# Patient Record
Sex: Male | Born: 1955 | Race: Black or African American | Hispanic: No | Marital: Single
Health system: Southern US, Community
[De-identification: ages and names within clinical notes are randomized; demographics above are authoritative.]

---

## 1998-10-27 ENCOUNTER — Encounter: Payer: Self-pay | Admitting: Emergency Medicine

## 1998-10-27 ENCOUNTER — Inpatient Hospital Stay (HOSPITAL_COMMUNITY): Admission: EM | Admit: 1998-10-27 | Discharge: 1998-10-28 | Payer: Self-pay | Admitting: Emergency Medicine

## 1998-10-27 ENCOUNTER — Encounter: Payer: Self-pay | Admitting: Cardiology

## 1998-11-17 ENCOUNTER — Emergency Department (HOSPITAL_COMMUNITY): Admission: EM | Admit: 1998-11-17 | Discharge: 1998-11-17 | Payer: Self-pay | Admitting: Emergency Medicine

## 1998-11-17 ENCOUNTER — Encounter: Payer: Self-pay | Admitting: Emergency Medicine

## 1998-12-12 ENCOUNTER — Emergency Department (HOSPITAL_COMMUNITY): Admission: EM | Admit: 1998-12-12 | Discharge: 1998-12-12 | Payer: Self-pay | Admitting: Emergency Medicine

## 1999-05-19 ENCOUNTER — Emergency Department (HOSPITAL_COMMUNITY): Admission: EM | Admit: 1999-05-19 | Discharge: 1999-05-19 | Payer: Self-pay | Admitting: Emergency Medicine

## 2010-10-13 ENCOUNTER — Emergency Department: Payer: Self-pay | Admitting: Emergency Medicine

## 2011-03-02 ENCOUNTER — Ambulatory Visit: Payer: Self-pay | Admitting: Surgery

## 2013-03-23 ENCOUNTER — Ambulatory Visit: Payer: Self-pay | Admitting: Family Medicine

## 2013-06-15 ENCOUNTER — Ambulatory Visit: Payer: Self-pay | Admitting: Family Medicine

## 2020-07-20 ENCOUNTER — Other Ambulatory Visit: Payer: Self-pay

## 2020-07-20 ENCOUNTER — Emergency Department (HOSPITAL_COMMUNITY): Payer: Medicaid Other

## 2020-07-20 ENCOUNTER — Inpatient Hospital Stay (HOSPITAL_COMMUNITY)
Admission: RE | Admit: 2020-07-20 | Discharge: 2020-08-20 | DRG: 207 | Disposition: E | Payer: Medicaid Other | Attending: Pulmonary Disease | Admitting: Pulmonary Disease

## 2020-07-20 DIAGNOSIS — R0902 Hypoxemia: Secondary | ICD-10-CM

## 2020-07-20 DIAGNOSIS — J810 Acute pulmonary edema: Secondary | ICD-10-CM | POA: Diagnosis not present

## 2020-07-20 DIAGNOSIS — D6959 Other secondary thrombocytopenia: Secondary | ICD-10-CM | POA: Diagnosis present

## 2020-07-20 DIAGNOSIS — R3129 Other microscopic hematuria: Secondary | ICD-10-CM | POA: Diagnosis present

## 2020-07-20 DIAGNOSIS — N400 Enlarged prostate without lower urinary tract symptoms: Secondary | ICD-10-CM | POA: Diagnosis present

## 2020-07-20 DIAGNOSIS — Z452 Encounter for adjustment and management of vascular access device: Secondary | ICD-10-CM

## 2020-07-20 DIAGNOSIS — T829XXA Unspecified complication of cardiac and vascular prosthetic device, implant and graft, initial encounter: Secondary | ICD-10-CM

## 2020-07-20 DIAGNOSIS — J80 Acute respiratory distress syndrome: Secondary | ICD-10-CM | POA: Diagnosis present

## 2020-07-20 DIAGNOSIS — F17210 Nicotine dependence, cigarettes, uncomplicated: Secondary | ICD-10-CM | POA: Diagnosis present

## 2020-07-20 DIAGNOSIS — R6521 Severe sepsis with septic shock: Secondary | ICD-10-CM | POA: Diagnosis not present

## 2020-07-20 DIAGNOSIS — R34 Anuria and oliguria: Secondary | ICD-10-CM | POA: Diagnosis present

## 2020-07-20 DIAGNOSIS — Z66 Do not resuscitate: Secondary | ICD-10-CM | POA: Diagnosis not present

## 2020-07-20 DIAGNOSIS — J1282 Pneumonia due to coronavirus disease 2019: Secondary | ICD-10-CM | POA: Diagnosis present

## 2020-07-20 DIAGNOSIS — M7989 Other specified soft tissue disorders: Secondary | ICD-10-CM | POA: Diagnosis not present

## 2020-07-20 DIAGNOSIS — R531 Weakness: Secondary | ICD-10-CM | POA: Diagnosis not present

## 2020-07-20 DIAGNOSIS — J069 Acute upper respiratory infection, unspecified: Secondary | ICD-10-CM | POA: Diagnosis not present

## 2020-07-20 DIAGNOSIS — R7989 Other specified abnormal findings of blood chemistry: Secondary | ICD-10-CM | POA: Diagnosis not present

## 2020-07-20 DIAGNOSIS — Z515 Encounter for palliative care: Secondary | ICD-10-CM | POA: Diagnosis not present

## 2020-07-20 DIAGNOSIS — E872 Acidosis: Secondary | ICD-10-CM | POA: Diagnosis present

## 2020-07-20 DIAGNOSIS — R140 Abdominal distension (gaseous): Secondary | ICD-10-CM

## 2020-07-20 DIAGNOSIS — E1122 Type 2 diabetes mellitus with diabetic chronic kidney disease: Secondary | ICD-10-CM | POA: Diagnosis present

## 2020-07-20 DIAGNOSIS — N1832 Chronic kidney disease, stage 3b: Secondary | ICD-10-CM | POA: Diagnosis present

## 2020-07-20 DIAGNOSIS — N170 Acute kidney failure with tubular necrosis: Secondary | ICD-10-CM | POA: Diagnosis present

## 2020-07-20 DIAGNOSIS — I361 Nonrheumatic tricuspid (valve) insufficiency: Secondary | ICD-10-CM | POA: Diagnosis not present

## 2020-07-20 DIAGNOSIS — I82451 Acute embolism and thrombosis of right peroneal vein: Secondary | ICD-10-CM | POA: Diagnosis not present

## 2020-07-20 DIAGNOSIS — G9341 Metabolic encephalopathy: Secondary | ICD-10-CM | POA: Diagnosis not present

## 2020-07-20 DIAGNOSIS — Z7189 Other specified counseling: Secondary | ICD-10-CM

## 2020-07-20 DIAGNOSIS — I2609 Other pulmonary embolism with acute cor pulmonale: Secondary | ICD-10-CM | POA: Diagnosis not present

## 2020-07-20 DIAGNOSIS — U071 COVID-19: Principal | ICD-10-CM

## 2020-07-20 DIAGNOSIS — I129 Hypertensive chronic kidney disease with stage 1 through stage 4 chronic kidney disease, or unspecified chronic kidney disease: Secondary | ICD-10-CM | POA: Diagnosis present

## 2020-07-20 DIAGNOSIS — T380X5A Adverse effect of glucocorticoids and synthetic analogues, initial encounter: Secondary | ICD-10-CM | POA: Diagnosis not present

## 2020-07-20 DIAGNOSIS — N179 Acute kidney failure, unspecified: Secondary | ICD-10-CM

## 2020-07-20 DIAGNOSIS — E861 Hypovolemia: Secondary | ICD-10-CM | POA: Diagnosis not present

## 2020-07-20 DIAGNOSIS — E1165 Type 2 diabetes mellitus with hyperglycemia: Secondary | ICD-10-CM | POA: Diagnosis not present

## 2020-07-20 DIAGNOSIS — A4189 Other specified sepsis: Secondary | ICD-10-CM | POA: Diagnosis not present

## 2020-07-20 DIAGNOSIS — J9602 Acute respiratory failure with hypercapnia: Secondary | ICD-10-CM

## 2020-07-20 DIAGNOSIS — Z9911 Dependence on respirator [ventilator] status: Secondary | ICD-10-CM

## 2020-07-20 DIAGNOSIS — D6489 Other specified anemias: Secondary | ICD-10-CM | POA: Diagnosis not present

## 2020-07-20 DIAGNOSIS — E875 Hyperkalemia: Secondary | ICD-10-CM | POA: Diagnosis present

## 2020-07-20 DIAGNOSIS — E11649 Type 2 diabetes mellitus with hypoglycemia without coma: Secondary | ICD-10-CM | POA: Diagnosis not present

## 2020-07-20 DIAGNOSIS — R001 Bradycardia, unspecified: Secondary | ICD-10-CM | POA: Diagnosis not present

## 2020-07-20 DIAGNOSIS — Z4659 Encounter for fitting and adjustment of other gastrointestinal appliance and device: Secondary | ICD-10-CM

## 2020-07-20 DIAGNOSIS — J9600 Acute respiratory failure, unspecified whether with hypoxia or hypercapnia: Secondary | ICD-10-CM

## 2020-07-20 DIAGNOSIS — R579 Shock, unspecified: Secondary | ICD-10-CM

## 2020-07-20 DIAGNOSIS — I480 Paroxysmal atrial fibrillation: Secondary | ICD-10-CM | POA: Diagnosis not present

## 2020-07-20 DIAGNOSIS — Z01818 Encounter for other preprocedural examination: Secondary | ICD-10-CM

## 2020-07-20 DIAGNOSIS — I2699 Other pulmonary embolism without acute cor pulmonale: Secondary | ICD-10-CM | POA: Diagnosis not present

## 2020-07-20 DIAGNOSIS — J9601 Acute respiratory failure with hypoxia: Secondary | ICD-10-CM | POA: Diagnosis present

## 2020-07-20 DIAGNOSIS — J9690 Respiratory failure, unspecified, unspecified whether with hypoxia or hypercapnia: Secondary | ICD-10-CM

## 2020-07-20 DIAGNOSIS — Z992 Dependence on renal dialysis: Secondary | ICD-10-CM

## 2020-07-20 DIAGNOSIS — Z781 Physical restraint status: Secondary | ICD-10-CM

## 2020-07-20 LAB — CBC WITH DIFFERENTIAL/PLATELET
Abs Immature Granulocytes: 0.04 10*3/uL (ref 0.00–0.07)
Basophils Absolute: 0 10*3/uL (ref 0.0–0.1)
Basophils Relative: 0 %
Eosinophils Absolute: 0 10*3/uL (ref 0.0–0.5)
Eosinophils Relative: 0 %
HCT: 51.2 % (ref 39.0–52.0)
Hemoglobin: 17.1 g/dL — ABNORMAL HIGH (ref 13.0–17.0)
Immature Granulocytes: 1 %
Lymphocytes Relative: 15 %
Lymphs Abs: 0.8 10*3/uL (ref 0.7–4.0)
MCH: 28 pg (ref 26.0–34.0)
MCHC: 33.4 g/dL (ref 30.0–36.0)
MCV: 83.8 fL (ref 80.0–100.0)
Monocytes Absolute: 0.6 10*3/uL (ref 0.1–1.0)
Monocytes Relative: 11 %
Neutro Abs: 4.2 10*3/uL (ref 1.7–7.7)
Neutrophils Relative %: 73 %
Platelets: 226 10*3/uL (ref 150–400)
RBC: 6.11 MIL/uL — ABNORMAL HIGH (ref 4.22–5.81)
RDW: 12.9 % (ref 11.5–15.5)
WBC: 5.6 10*3/uL (ref 4.0–10.5)
nRBC: 0 % (ref 0.0–0.2)

## 2020-07-20 LAB — COMPREHENSIVE METABOLIC PANEL
ALT: 46 U/L — ABNORMAL HIGH (ref 0–44)
ALT: 48 U/L — ABNORMAL HIGH (ref 0–44)
AST: 113 U/L — ABNORMAL HIGH (ref 15–41)
AST: 108 U/L — ABNORMAL HIGH (ref 15–41)
Albumin: 2.5 g/dL — ABNORMAL LOW (ref 3.5–5.0)
Albumin: 2.5 g/dL — ABNORMAL LOW (ref 3.5–5.0)
Alkaline Phosphatase: 59 U/L (ref 38–126)
Alkaline Phosphatase: 59 U/L (ref 38–126)
Anion gap: 20 — ABNORMAL HIGH (ref 5–15)
Anion gap: 22 — ABNORMAL HIGH (ref 5–15)
BUN: 101 mg/dL — ABNORMAL HIGH (ref 8–23)
BUN: 99 mg/dL — ABNORMAL HIGH (ref 8–23)
CO2: 16 mmol/L — ABNORMAL LOW (ref 22–32)
CO2: 17 mmol/L — ABNORMAL LOW (ref 22–32)
Calcium: 8.3 mg/dL — ABNORMAL LOW (ref 8.9–10.3)
Calcium: 8.5 mg/dL — ABNORMAL LOW (ref 8.9–10.3)
Chloride: 97 mmol/L — ABNORMAL LOW (ref 98–111)
Chloride: 96 mmol/L — ABNORMAL LOW (ref 98–111)
Creatinine, Ser: 7.9 mg/dL — ABNORMAL HIGH (ref 0.61–1.24)
Creatinine, Ser: 8.12 mg/dL — ABNORMAL HIGH (ref 0.61–1.24)
GFR calc Af Amer: 8 mL/min — ABNORMAL LOW (ref >60)
GFR calc Af Amer: 7 mL/min — ABNORMAL LOW (ref >60)
GFR calc non Af Amer: 7 mL/min — ABNORMAL LOW (ref >60)
GFR calc non Af Amer: 6 mL/min — ABNORMAL LOW (ref >60)
Glucose, Bld: 184 mg/dL — ABNORMAL HIGH (ref 70–99)
Glucose, Bld: 182 mg/dL — ABNORMAL HIGH (ref 70–99)
Potassium: 5 mmol/L (ref 3.5–5.1)
Potassium: 5.2 mmol/L — ABNORMAL HIGH (ref 3.5–5.1)
Sodium: 133 mmol/L — ABNORMAL LOW (ref 135–145)
Sodium: 135 mmol/L (ref 135–145)
Total Bilirubin: 0.4 mg/dL (ref 0.3–1.2)
Total Bilirubin: 0.3 mg/dL (ref 0.3–1.2)
Total Protein: 7.7 g/dL (ref 6.5–8.1)
Total Protein: 7.8 g/dL (ref 6.5–8.1)

## 2020-07-20 LAB — BLOOD GAS, ARTERIAL
Acid-base deficit: 11.3 mmol/L — ABNORMAL HIGH (ref 0.0–2.0)
Bicarbonate: 13.2 mmol/L — ABNORMAL LOW (ref 20.0–28.0)
FIO2: 100
O2 Saturation: 93.6 %
Patient temperature: 98.6
pCO2 arterial: 27 mmHg — ABNORMAL LOW (ref 32.0–48.0)
pH, Arterial: 7.31 — ABNORMAL LOW (ref 7.350–7.450)
pO2, Arterial: 78 mmHg — ABNORMAL LOW (ref 83.0–108.0)

## 2020-07-20 LAB — SARS CORONAVIRUS 2 BY RT PCR (HOSPITAL ORDER, PERFORMED IN ~~LOC~~ HOSPITAL LAB): SARS Coronavirus 2: POSITIVE — AB

## 2020-07-20 LAB — TROPONIN I (HIGH SENSITIVITY)
Troponin I (High Sensitivity): 23 ng/L — ABNORMAL HIGH (ref <18)
Troponin I (High Sensitivity): 21 ng/L — ABNORMAL HIGH (ref <18)

## 2020-07-20 LAB — MRSA PCR SCREENING: MRSA by PCR: NEGATIVE

## 2020-07-20 LAB — PROCALCITONIN: Procalcitonin: 4.75 ng/mL

## 2020-07-20 LAB — CBG MONITORING, ED
Glucose-Capillary: 197 mg/dL — ABNORMAL HIGH (ref 70–99)
Glucose-Capillary: 263 mg/dL — ABNORMAL HIGH (ref 70–99)

## 2020-07-20 LAB — FERRITIN: Ferritin: 1711 ng/mL — ABNORMAL HIGH (ref 24–336)

## 2020-07-20 LAB — FIBRINOGEN: Fibrinogen: >800 mg/dL — ABNORMAL HIGH (ref 210–475)

## 2020-07-20 LAB — GLUCOSE, CAPILLARY: Glucose-Capillary: 271 mg/dL — ABNORMAL HIGH (ref 70–99)

## 2020-07-20 LAB — D-DIMER, QUANTITATIVE: D-Dimer, Quant: 5.76 ug/mL-FEU — ABNORMAL HIGH (ref 0.00–0.50)

## 2020-07-20 LAB — LACTATE DEHYDROGENASE: LDH: 1003 U/L — ABNORMAL HIGH (ref 98–192)

## 2020-07-20 LAB — C-REACTIVE PROTEIN: CRP: 23.9 mg/dL — ABNORMAL HIGH (ref <1.0)

## 2020-07-20 LAB — LACTIC ACID, PLASMA
Lactic Acid, Venous: 2.1 mmol/L (ref 0.5–1.9)
Lactic Acid, Venous: 1.3 mmol/L (ref 0.5–1.9)

## 2020-07-20 LAB — HIV ANTIBODY (ROUTINE TESTING W REFLEX): HIV Screen 4th Generation wRfx: NONREACTIVE

## 2020-07-20 LAB — TRIGLYCERIDES: Triglycerides: 306 mg/dL — ABNORMAL HIGH (ref <150)

## 2020-07-20 LAB — ABO/RH: ABO/RH(D): O POS

## 2020-07-20 MED ORDER — TOCILIZUMAB 400 MG/20ML IV SOLN
800.0000 mg | Freq: Once | INTRAVENOUS | Status: AC
  Administered 2020-07-20: 800 mg via INTRAVENOUS
  Filled 2020-07-20: qty 40

## 2020-07-20 MED ORDER — ACETAMINOPHEN 325 MG PO TABS: 650.0000 mg | ORAL_TABLET | Freq: Four times a day (QID) | ORAL | Status: DC | PRN

## 2020-07-20 MED ORDER — ONDANSETRON HCL 4 MG PO TABS: 4.0000 mg | ORAL_TABLET | Freq: Four times a day (QID) | ORAL | Status: DC | PRN

## 2020-07-20 MED ORDER — ZINC SULFATE 220 (50 ZN) MG PO CAPS
220.0000 mg | ORAL_CAPSULE | Freq: Every day | ORAL | Status: DC
  Administered 2020-07-20 – 2020-07-25 (×6): 220 mg via ORAL
  Filled 2020-07-20 (×6): qty 1

## 2020-07-20 MED ORDER — DEXAMETHASONE SODIUM PHOSPHATE 10 MG/ML IJ SOLN
6.0000 mg | INTRAMUSCULAR | Status: DC
  Administered 2020-07-21: 6 mg via INTRAVENOUS
  Filled 2020-07-20: qty 1

## 2020-07-20 MED ORDER — ASCORBIC ACID 500 MG PO TABS
500.0000 mg | ORAL_TABLET | Freq: Every day | ORAL | Status: DC
  Administered 2020-07-20 – 2020-07-25 (×6): 500 mg via ORAL
  Filled 2020-07-20 (×6): qty 1

## 2020-07-20 MED ORDER — HEPARIN SODIUM (PORCINE) 5000 UNIT/ML IJ SOLN
5000.0000 [IU] | Freq: Three times a day (TID) | INTRAMUSCULAR | Status: DC
  Administered 2020-07-20 – 2020-07-22 (×5): 5000 [IU] via SUBCUTANEOUS
  Filled 2020-07-20 (×5): qty 1

## 2020-07-20 MED ORDER — ONDANSETRON HCL 4 MG/2ML IJ SOLN: 4.0000 mg | Freq: Four times a day (QID) | INTRAMUSCULAR | Status: DC | PRN

## 2020-07-20 MED ORDER — SODIUM CHLORIDE 0.9 % IV BOLUS
1000.0000 mL | Freq: Once | INTRAVENOUS | Status: AC
  Administered 2020-07-20: 1000 mL via INTRAVENOUS

## 2020-07-20 MED ORDER — SODIUM CHLORIDE 0.9 % IV SOLN
2.0000 g | Freq: Every day | INTRAVENOUS | Status: DC
  Administered 2020-07-20 – 2020-07-23 (×4): 2 g via INTRAVENOUS
  Filled 2020-07-20 (×2): qty 2
  Filled 2020-07-20 (×2): qty 20

## 2020-07-20 MED ORDER — SODIUM CHLORIDE 0.9 % IV SOLN
100.0000 mg | INTRAVENOUS | Status: AC
  Administered 2020-07-20: 100 mg via INTRAVENOUS
  Filled 2020-07-20: qty 20

## 2020-07-20 MED ORDER — SODIUM CHLORIDE 0.9% FLUSH
3.0000 mL | Freq: Two times a day (BID) | INTRAVENOUS | Status: DC
  Administered 2020-07-20 – 2020-08-10 (×37): 3 mL via INTRAVENOUS

## 2020-07-20 MED ORDER — INSULIN ASPART 100 UNIT/ML ~~LOC~~ SOLN
0.0000 [IU] | Freq: Three times a day (TID) | SUBCUTANEOUS | Status: DC
  Administered 2020-07-20: 3 [IU] via SUBCUTANEOUS
  Administered 2020-07-21: 2 [IU] via SUBCUTANEOUS
  Administered 2020-07-21: 3 [IU] via SUBCUTANEOUS
  Administered 2020-07-21: 2 [IU] via SUBCUTANEOUS
  Filled 2020-07-20: qty 0.09

## 2020-07-20 MED ORDER — DEXAMETHASONE SODIUM PHOSPHATE 10 MG/ML IJ SOLN
6.0000 mg | Freq: Once | INTRAMUSCULAR | Status: AC
  Administered 2020-07-20: 6 mg via INTRAVENOUS
  Filled 2020-07-20: qty 1

## 2020-07-20 MED ORDER — ORAL CARE MOUTH RINSE
15.0000 mL | Freq: Two times a day (BID) | OROMUCOSAL | Status: DC
  Administered 2020-07-20: 15 mL via OROMUCOSAL

## 2020-07-20 MED ORDER — SODIUM CHLORIDE 0.9 % IV SOLN
100.0000 mg | Freq: Every day | INTRAVENOUS | Status: AC
  Administered 2020-07-21 – 2020-07-24 (×4): 100 mg via INTRAVENOUS
  Filled 2020-07-20 (×4): qty 20

## 2020-07-20 MED ORDER — CHLORHEXIDINE GLUCONATE CLOTH 2 % EX PADS
6.0000 | MEDICATED_PAD | Freq: Every day | CUTANEOUS | Status: DC
  Administered 2020-07-20 – 2020-08-09 (×23): 6 via TOPICAL

## 2020-07-20 MED ORDER — SALINE SPRAY 0.65 % NA SOLN
1.0000 | NASAL | Status: DC | PRN
  Administered 2020-07-20: 1 via NASAL
  Filled 2020-07-20: qty 44

## 2020-07-20 MED ORDER — SODIUM CHLORIDE 0.9 % IV SOLN: 200.0000 mg | Freq: Once | INTRAVENOUS | Status: DC

## 2020-07-20 MED ORDER — REMDESIVIR 100 MG IV SOLR
100.0000 mg | Freq: Every day | INTRAVENOUS | Status: DC
Start: 2020-07-21 — End: 2020-07-20

## 2020-07-20 MED ORDER — LACTATED RINGERS IV SOLN: INTRAVENOUS | Status: DC

## 2020-07-20 MED ORDER — SODIUM CHLORIDE 0.9 % IV BOLUS
500.0000 mL | Freq: Once | INTRAVENOUS | Status: AC
  Administered 2020-07-20: 500 mL via INTRAVENOUS

## 2020-07-20 NOTE — H&P (Signed)
NAME:  Alex [name], MRN:  [id number], DOB:  02-Oct-1956, LOS: 0 ADMISSION DATE:  08/02/2020, CONSULTATION DATE:  08/16/2020  REFERRING MD:  Sedonia Small, EDP, CHIEF COMPLAINT: Respiratory distress, hypoxia  Brief History   64 year old unvaccinated smoker with acute hypoxemic respiratory failure due to COVID pneumonia And AKI  History of present illness   64 year old smoker reports fever for 3 days, feeling unwell for about a week, generalized weakness and shortness of breath that developed over 1 day.  Brought in by family, poorly responsive, saturations 45% on room air in triage, placed on nonrebreather and then on high flow nasal cannula with improvement in saturations to the 90s. Chest x-ray showed evidence of bilateral interstitial and alveolar infiltrates. Labs showed BUN/creatinine of 101/7.9, CRP 24 , no leukocytosis, lactate 2.1, polycythemic with hemoglobin of 17 ABG showed mild metabolic acidosis 7.3 4/74/2595 on 100% high flow High flow nasal cannula was turned down subsequently to 80% with 30 L flow , able to talk to me in full sentences He is unvaccinated, no clear reason given , lives with his girlfriend, no sick contacts  Past Medical History  Smoker, quit 2 weeks ago  [place] [facility] Events   8/1 admission >> poor mental status, extreme hypoxia  Consults:    Procedures:    Significant Diagnostic Tests:    Micro Data:  RVP 8/1 >> CRP 24  Antimicrobials:  Remdesivir 8/1 >> Ceftriaxone 8/1 >> Zithromax 8/1 >>  Interim history/subjective:    Objective   Blood pressure 101/65, pulse 77, temperature 98 F (36.7 C), temperature source Oral, resp. rate 22, SpO2 90 %.    FiO2 (%):  [80 %-100 %] 80 %  No intake or output data in the 24 hours ending 08/08/2020 1627 There were no vitals filed for this visit.  Examination: General: Elderly man, in mild distress, on high flow nasal cannula 80% / 30 L HENT: No pallor, icterus, no lymphadenopathy, dry mucosa Lungs:  Crackles right base, otherwise clear, mild accessory muscle use Cardiovascular: S1-S2 regular, sinus on monitor Abdomen: Soft, nontender Extremities: No deformity, no edema Neuro: Alert, interactive, normal speech, nonfocal   Chest x-ray personally reviewed which shows evidence of prominent interstitial markings with airspace disease at both bases consistent with multifocal pneumonia  Resolved Hospital Problem list     Assessment & Plan:  Appears to be advanced Covid pneumonia with severe hypoxia and AKI  Acute hypoxic respiratory failure -he seems to have settled with high flow nasal cannula with decreased work of breathing and hopefully this has brought him in some time to administer medications    COVID-19 positive test (U07.1, COVID-19) with Acute Pneumonia (J12.89, Other viral pneumonia)  -IV remdesivir, monitor LFTs slight high already -IV dexamethasone 6 mg daily, monitor CBG, slight high already, not a known diabetic -We will dose with Actemra x1, discussed risks and benefits -Monitor inflammatory markers daily for 5 days  AKI -likely severely prerenal, will give 2 L fluid bolus and then LR at 150 an hour -Check Fina and repeat bmet in 8 hours  Goals of care -discussed that his breathing may get worse to the point of requiring life support.  "I will go when my time has come "  Best practice:  Diet: Clear liquids Pain/Anxiety/Delirium protocol (if indicated): N/A VAP protocol (if indicated): N/A DVT prophylaxis: Subcu heparin GI prophylaxis: N/A Glucose control: SSI Mobility: Bedrest Code Status: Full code Family Communication: None Disposition: ICU  Labs   CBC: Recent Labs  Lab 07/22/2020  1248  WBC 5.6  NEUTROABS 4.2  HGB 17.1*  HCT 51.2  MCV 83.8  PLT 151    Basic Metabolic Panel: Recent Labs  Lab 08/08/2020 1248  NA 133*  K 5.0  CL 97*  CO2 16*  GLUCOSE 184*  BUN 101*  CREATININE 7.90*  CALCIUM 8.3*   GFR: CrCl cannot be calculated (Unknown  ideal weight.). Recent Labs  Lab 08/05/2020 1248  PROCALCITON 4.75  WBC 5.6  LATICACIDVEN 2.1*    Liver Function Tests: Recent Labs  Lab 08/03/2020 1248  AST 113*  ALT 46*  ALKPHOS 59  BILITOT 0.4  PROT 7.7  ALBUMIN 2.5*   No results for input(s): LIPASE, AMYLASE in the last 168 hours. No results for input(s): AMMONIA in the last 168 hours.  ABG    Component Value Date/Time   PHART 7.310 (L) 08/08/2020 1250   PCO2ART 27.0 (L) 07/23/2020 1250   PO2ART 78.0 (L) 08/03/2020 1250   HCO3 13.2 (L) 08/03/2020 1250   ACIDBASEDEF 11.3 (H) 08/18/2020 1250   O2SAT 93.6 08/12/2020 1250     Coagulation Profile: No results for input(s): INR, PROTIME in the last 168 hours.  Cardiac Enzymes: No results for input(s): CKTOTAL, CKMB, CKMBINDEX, TROPONINI in the last 168 hours.  HbA1C: No results found for: HGBA1C  CBG: No results for input(s): GLUCAP in the last 168 hours.  Review of Systems:   Shortness of breath Dry cough Fevers Loss of appetite  Past Medical History  He,  has no past medical history on file.    Social History      Family History   His family history is not on file.   Allergies Not on File   Home Medications  Prior to Admission medications   Not on File     Critical care time: [place] MD. [facility]. [facility]  If no response to pager , please call [phone]   08/13/2020

## 2020-07-20 NOTE — ED Notes (Signed)
Date and time results received: 08/16/2020 1:49 PM  (use smartphrase ".now" to insert current time)  Test:  Lactic acid   // COVID  Critical Value: 2.2 // positive   Name of Provider Notified: BERO MD

## 2020-07-20 NOTE — ED Provider Notes (Signed)
[facility] Emergency Department Provider Note MRN:  [id number]  Arrival date & time: 08/05/2020     Chief Complaint   Hypoxia History of Present Illness   Alex [name] is a 64 y.o. year-old male with unknown past medical history presenting to the ED with chief complaint of hypoxia.  Altered mental status at home, found to be profoundly hypoxic here in the emergency department.  I was unable to obtain an accurate HPI, PMH, or ROS due to the patient's respiratory failure.  Level 5 caveat.  Review of Systems  Altered mental status, hypoxia.  Patient's Health History   No past medical history on file.    No family history on file.  Social History   Socioeconomic History  . Marital status: Single    Spouse name: Not on file  . Number of children: Not on file  . Years of education: Not on file  . Highest education level: Not on file  Occupational History  . Not on file  Tobacco Use  . Smoking status: Not on file  Substance and Sexual Activity  . Alcohol use: Not on file  . Drug use: Not on file  . Sexual activity: Not on file  Other Topics Concern  . Not on file  Social History Narrative  . Not on file   Social Determinants of Health   Financial Resource Strain:   . Difficulty of Paying Living Expenses:   Food Insecurity:   . Worried About Charity fundraiser in the Last Year:   . Arboriculturist in the Last Year:   Transportation Needs:   . Film/video editor (Medical):   Marland Kitchen Lack of Transportation (Non-Medical):   Physical Activity:   . Days of Exercise per Week:   . Minutes of Exercise per Session:   Stress:   . Feeling of Stress :   Social Connections:   . Frequency of Communication with Friends and Family:   . Frequency of Social Gatherings with Friends and Family:   . Attends Religious Services:   . Active Member of Clubs or Organizations:   . Attends Archivist Meetings:   Marland Kitchen Marital Status:   Intimate Partner  Violence:   . Fear of Current or Ex-Partner:   . Emotionally Abused:   Marland Kitchen Physically Abused:   . Sexually Abused:      Physical Exam   Vitals:   07/30/2020 1300  Pulse: 85  Resp: (!) 36  SpO2: 92%    CONSTITUTIONAL: Ill-appearing NEURO: Somnolent, able to state name, intermittently follow commands and answer questions, moving all extremities EYES:  eyes equal and reactive ENT/NECK:  no LAD, no JVD CARDIO: Regular rate, well-perfused, normal S1 and S2 PULM: No significant wheezing or rhonchi, tachypneic GI/GU:  normal bowel sounds, non-distended, non-tender MSK/SPINE:  No gross deformities, no edema SKIN:  no rash, atraumatic PSYCH:  Appropriate speech and behavior  *Additional and/or pertinent findings included in MDM below  Diagnostic and Interventional Summary    EKG Interpretation  Date/Time:  Sunday July 20 2020 12:42:38 EDT Ventricular Rate:  84 PR Interval:    QRS Duration: 103 QT Interval:  394 QTC Calculation: 466 R Axis:   36 Text Interpretation: Sinus rhythm Probable left atrial enlargement Confirmed by Gerlene Fee [phone]) on 08/18/2020 2:39:54 PM      Labs Reviewed  SARS CORONAVIRUS 2 BY RT PCR (HOSPITAL ORDER, [facility]) - Abnormal; Notable for the following components:  Result Value   SARS Coronavirus 2 POSITIVE (*)    All other components within normal limits  LACTIC ACID, PLASMA - Abnormal; Notable for the following components:   Lactic Acid, Venous 2.1 (*)    All other components within normal limits  CBC WITH DIFFERENTIAL/PLATELET - Abnormal; Notable for the following components:   RBC 6.11 (*)    Hemoglobin 17.1 (*)    All other components within normal limits  COMPREHENSIVE METABOLIC PANEL - Abnormal; Notable for the following components:   Sodium 133 (*)    Chloride 97 (*)    CO2 16 (*)    Glucose, Bld 184 (*)    BUN 101 (*)    Creatinine, Ser 7.90 (*)    Calcium 8.3 (*)    Albumin 2.5 (*)    AST 113  (*)    ALT 46 (*)    GFR calc non Af Amer 7 (*)    GFR calc Af Amer 8 (*)    Anion gap 20 (*)    All other components within normal limits  D-DIMER, QUANTITATIVE (NOT AT [facility]) - Abnormal; Notable for the following components:   D-Dimer, Quant 5.76 (*)    All other components within normal limits  LACTATE DEHYDROGENASE - Abnormal; Notable for the following components:   LDH 1,003 (*)    All other components within normal limits  FERRITIN - Abnormal; Notable for the following components:   Ferritin 1,711 (*)    All other components within normal limits  TRIGLYCERIDES - Abnormal; Notable for the following components:   Triglycerides 306 (*)    All other components within normal limits  FIBRINOGEN - Abnormal; Notable for the following components:   Fibrinogen >800 (*)    All other components within normal limits  C-REACTIVE PROTEIN - Abnormal; Notable for the following components:   CRP 23.9 (*)    All other components within normal limits  BLOOD GAS, ARTERIAL - Abnormal; Notable for the following components:   pH, Arterial 7.310 (*)    pCO2 arterial 27.0 (*)    pO2, Arterial 78.0 (*)    Bicarbonate 13.2 (*)    Acid-base deficit 11.3 (*)    All other components within normal limits  TROPONIN I (HIGH SENSITIVITY) - Abnormal; Notable for the following components:   Troponin I (High Sensitivity) 23 (*)    All other components within normal limits  CULTURE, BLOOD (ROUTINE X 2)  CULTURE, BLOOD (ROUTINE X 2)  PROCALCITONIN  LACTIC ACID, PLASMA  TROPONIN I (HIGH SENSITIVITY)    DG Chest Port 1 View  Final Result      Medications  dexamethasone (DECADRON) injection 6 mg (6 mg Intravenous Given 08/17/2020 1255)  sodium chloride 0.9 % bolus 500 mL (0 mLs Intravenous Stopped 08/14/2020 1356)     Procedures  /  Critical Care .Critical Care Performed by: Maudie Flakes, MD Authorized by: Maudie Flakes, MD   Critical care provider statement:    Critical care time (minutes):  45    Critical care was time spent personally by me on the following activities:  Discussions with consultants, evaluation of patient's response to treatment, examination of patient, ordering and performing treatments and interventions, ordering and review of laboratory studies, ordering and review of radiographic studies, pulse oximetry, re-evaluation of patient's condition, obtaining history from patient or surrogate and review of old charts    ED Course and Medical Decision Making  I have reviewed the triage vital signs, the nursing notes, and  pertinent available records from the EMR.  Listed above are laboratory and imaging tests that I personally ordered, reviewed, and interpreted and then considered in my medical decision making (see below for details).      Profound hypoxic respiratory failure, patient able to state name and able to confirm that he is not vaccinated.  Highly suspicious for COVID-19.  30 to 40% oxygen saturation on room air, improving to 70 to 80% on nonrebreather, will start heated high flow.  Also hypotensive with systolics in the 66A, will start judicious fluid, provide IV Decadron, Covid swab pending.  No evidence of DVT, no chest pain.  Labs revealing significant acidosis, acute renal failure.  Patient is Covid positive, will need admission.  Barth Kirks. Sedonia Small, [place] [email or web address]  Final Clinical Impressions(s) / ED Diagnoses     ICD-10-CM   1. COVID-19  U07.1   2. Acute respiratory failure with hypoxia (HCC)  J96.01   3. Acute renal failure, unspecified acute renal failure type ([place])  N17.9     ED Discharge Orders    None       Discharge Instructions Discussed with and Provided to Patient:   Discharge Instructions   None       Maudie Flakes, MD 07/26/2020 1441

## 2020-07-20 NOTE — Progress Notes (Signed)
eLink Physician-Brief Progress Note Patient Name: Alex [name] DOB: 1956-10-05 MRN: [id number]   Date of Service  08/05/2020  HPI/Events of Note  Request by Dr. Elsworth Soho to check BMP at 8 PM. No BMP ordered for 8 PM. Last K+ = 5.2  eICU Interventions  Will order BMP STAT.      Intervention Category Major Interventions: Electrolyte abnormality - evaluation and management  [name] 08/17/2020, 10:11 PM

## 2020-07-20 NOTE — ED Notes (Signed)
Attempted to call report; pt has not been approved for the floor yet. Floor aware that we are waiting on their approval.

## 2020-07-20 NOTE — ED Triage Notes (Addendum)
Patient brought by family. Patient unable to get out of car. Unresponsive. Family stated patient was weak and confused. Got patient out of car and into triage room. Patient not responding to questions. Patient kept closing eyes.patient o2 sat was 46% on room air in triage. o2 saturations continue to decrease. MD notified immediately. MD BERO into triage room, patient placed on non rebreather at 15lpm, o2 saturations not increasing. Patient moved to RESA by MD BERO, tech, and Probation officer.

## 2020-07-20 NOTE — Progress Notes (Signed)
eLink Physician-Brief Progress Note Patient Name: Alex [name] DOB: Sep 25, 1956 MRN: [id number]   Date of Service  08/11/2020  HPI/Events of Note  Request from Dr. Elsworth Soho to start Ceftriaxone. Indication: Bronchitis?  eICU Interventions  Plan: 1. Ceftriaxone 1 gm IV now and Q 24 hours.      Intervention Category Major Interventions: Other:  Lysle Dingwall 08/13/2020, 8:11 PM

## 2020-07-21 ENCOUNTER — Inpatient Hospital Stay (HOSPITAL_COMMUNITY): Payer: Medicaid Other

## 2020-07-21 DIAGNOSIS — M7989 Other specified soft tissue disorders: Secondary | ICD-10-CM

## 2020-07-21 DIAGNOSIS — R7989 Other specified abnormal findings of blood chemistry: Secondary | ICD-10-CM

## 2020-07-21 LAB — CBC WITH DIFFERENTIAL/PLATELET
Abs Immature Granulocytes: 0.03 10*3/uL (ref 0.00–0.07)
Basophils Absolute: 0 10*3/uL (ref 0.0–0.1)
Basophils Relative: 0 %
Eosinophils Absolute: 0 10*3/uL (ref 0.0–0.5)
Eosinophils Relative: 0 %
HCT: 42.5 % (ref 39.0–52.0)
Hemoglobin: 13.8 g/dL (ref 13.0–17.0)
Immature Granulocytes: 1 %
Lymphocytes Relative: 15 %
Lymphs Abs: 0.4 10*3/uL — ABNORMAL LOW (ref 0.7–4.0)
MCH: 28.3 pg (ref 26.0–34.0)
MCHC: 32.5 g/dL (ref 30.0–36.0)
MCV: 87.1 fL (ref 80.0–100.0)
Monocytes Absolute: 0.2 10*3/uL (ref 0.1–1.0)
Monocytes Relative: 9 %
Neutro Abs: 2.1 10*3/uL (ref 1.7–7.7)
Neutrophils Relative %: 75 %
Platelets: 185 10*3/uL (ref 150–400)
RBC: 4.88 MIL/uL (ref 4.22–5.81)
RDW: 13.2 % (ref 11.5–15.5)
WBC: 2.8 10*3/uL — ABNORMAL LOW (ref 4.0–10.5)
nRBC: 0 % (ref 0.0–0.2)

## 2020-07-21 LAB — CBC
HCT: 39.8 % (ref 39.0–52.0)
Hemoglobin: 13.1 g/dL (ref 13.0–17.0)
MCH: 28.5 pg (ref 26.0–34.0)
MCHC: 32.9 g/dL (ref 30.0–36.0)
MCV: 86.7 fL (ref 80.0–100.0)
Platelets: 212 10*3/uL (ref 150–400)
RBC: 4.59 MIL/uL (ref 4.22–5.81)
RDW: 13.4 % (ref 11.5–15.5)
WBC: 6.2 10*3/uL (ref 4.0–10.5)
nRBC: 0 % (ref 0.0–0.2)

## 2020-07-21 LAB — PROTEIN / CREATININE RATIO, URINE
Creatinine, Urine: 146.69 mg/dL
Protein Creatinine Ratio: 3.31 mg/mg{Cre} — ABNORMAL HIGH (ref 0.00–0.15)
Total Protein, Urine: 486 mg/dL

## 2020-07-21 LAB — BLOOD GAS, ARTERIAL
Acid-base deficit: 13.9 mmol/L — ABNORMAL HIGH (ref 0.0–2.0)
Bicarbonate: 14.7 mmol/L — ABNORMAL LOW (ref 20.0–28.0)
Drawn by: 29503
FIO2: 100
MECHVT: 420 mL
O2 Saturation: 97.9 %
PEEP: 12 cmH2O
Patient temperature: 98.6
RATE: 35 resp/min
pCO2 arterial: 43.5 mmHg (ref 32.0–48.0)
pH, Arterial: 7.156 — CL (ref 7.350–7.450)
pO2, Arterial: 166 mmHg — ABNORMAL HIGH (ref 83.0–108.0)

## 2020-07-21 LAB — RENAL FUNCTION PANEL
Albumin: 1.8 g/dL — ABNORMAL LOW (ref 3.5–5.0)
Anion gap: 21 — ABNORMAL HIGH (ref 5–15)
BUN: 105 mg/dL — ABNORMAL HIGH (ref 8–23)
CO2: 15 mmol/L — ABNORMAL LOW (ref 22–32)
Calcium: 7.6 mg/dL — ABNORMAL LOW (ref 8.9–10.3)
Chloride: 102 mmol/L (ref 98–111)
Creatinine, Ser: 8.01 mg/dL — ABNORMAL HIGH (ref 0.61–1.24)
GFR calc Af Amer: 7 mL/min — ABNORMAL LOW (ref >60)
GFR calc non Af Amer: 6 mL/min — ABNORMAL LOW (ref >60)
Glucose, Bld: 213 mg/dL — ABNORMAL HIGH (ref 70–99)
Phosphorus: 8.4 mg/dL — ABNORMAL HIGH (ref 2.5–4.6)
Potassium: 5.6 mmol/L — ABNORMAL HIGH (ref 3.5–5.1)
Sodium: 138 mmol/L (ref 135–145)

## 2020-07-21 LAB — COMPREHENSIVE METABOLIC PANEL WITH GFR
ALT: 40 U/L (ref 0–44)
AST: 83 U/L — ABNORMAL HIGH (ref 15–41)
Albumin: 2.1 g/dL — ABNORMAL LOW (ref 3.5–5.0)
Alkaline Phosphatase: 57 U/L (ref 38–126)
Anion gap: 16 — ABNORMAL HIGH (ref 5–15)
BUN: 120 mg/dL — ABNORMAL HIGH (ref 8–23)
CO2: 19 mmol/L — ABNORMAL LOW (ref 22–32)
Calcium: 7.4 mg/dL — ABNORMAL LOW (ref 8.9–10.3)
Chloride: 101 mmol/L (ref 98–111)
Creatinine, Ser: 8.43 mg/dL — ABNORMAL HIGH (ref 0.61–1.24)
GFR calc Af Amer: 7 mL/min — ABNORMAL LOW
GFR calc non Af Amer: 6 mL/min — ABNORMAL LOW
Glucose, Bld: 260 mg/dL — ABNORMAL HIGH (ref 70–99)
Potassium: 5.8 mmol/L — ABNORMAL HIGH (ref 3.5–5.1)
Sodium: 136 mmol/L (ref 135–145)
Total Bilirubin: 0.3 mg/dL (ref 0.3–1.2)
Total Protein: 6.4 g/dL — ABNORMAL LOW (ref 6.5–8.1)

## 2020-07-21 LAB — URINALYSIS, COMPLETE (UACMP) WITH MICROSCOPIC
Bilirubin Urine: NEGATIVE
Glucose, UA: NEGATIVE mg/dL
Ketones, ur: NEGATIVE mg/dL
Leukocytes,Ua: NEGATIVE
Nitrite: NEGATIVE
Protein, ur: >=300 mg/dL — AB
Specific Gravity, Urine: 1.014 (ref 1.005–1.030)
pH: 5 (ref 5.0–8.0)

## 2020-07-21 LAB — PROTIME-INR
INR: 1.3 — ABNORMAL HIGH (ref 0.8–1.2)
Prothrombin Time: 15.2 seconds (ref 11.4–15.2)

## 2020-07-21 LAB — PHOSPHORUS
Phosphorus: 7.9 mg/dL — ABNORMAL HIGH (ref 2.5–4.6)
Phosphorus: 8.5 mg/dL — ABNORMAL HIGH (ref 2.5–4.6)

## 2020-07-21 LAB — BASIC METABOLIC PANEL
Anion gap: 15 (ref 5–15)
BUN: 86 mg/dL — ABNORMAL HIGH (ref 8–23)
CO2: 18 mmol/L — ABNORMAL LOW (ref 22–32)
Calcium: 7.5 mg/dL — ABNORMAL LOW (ref 8.9–10.3)
Chloride: 102 mmol/L (ref 98–111)
Creatinine, Ser: 8.52 mg/dL — ABNORMAL HIGH (ref 0.61–1.24)
GFR calc Af Amer: 7 mL/min — ABNORMAL LOW (ref >60)
GFR calc non Af Amer: 6 mL/min — ABNORMAL LOW (ref >60)
Glucose, Bld: 247 mg/dL — ABNORMAL HIGH (ref 70–99)
Potassium: 5.4 mmol/L — ABNORMAL HIGH (ref 3.5–5.1)
Sodium: 135 mmol/L (ref 135–145)

## 2020-07-21 LAB — CREATININE, URINE, RANDOM: Creatinine, Urine: 147.82 mg/dL

## 2020-07-21 LAB — MAGNESIUM
Magnesium: 2.4 mg/dL (ref 1.7–2.4)
Magnesium: 2.3 mg/dL (ref 1.7–2.4)

## 2020-07-21 LAB — GLUCOSE, CAPILLARY
Glucose-Capillary: 214 mg/dL — ABNORMAL HIGH (ref 70–99)
Glucose-Capillary: 165 mg/dL — ABNORMAL HIGH (ref 70–99)
Glucose-Capillary: 172 mg/dL — ABNORMAL HIGH (ref 70–99)
Glucose-Capillary: 206 mg/dL — ABNORMAL HIGH (ref 70–99)

## 2020-07-21 LAB — SODIUM, URINE, RANDOM: Sodium, Ur: 32 mmol/L

## 2020-07-21 LAB — TRIGLYCERIDES: Triglycerides: 257 mg/dL — ABNORMAL HIGH (ref <150)

## 2020-07-21 LAB — FERRITIN: Ferritin: 2093 ng/mL — ABNORMAL HIGH (ref 24–336)

## 2020-07-21 LAB — D-DIMER, QUANTITATIVE: D-Dimer, Quant: 7.08 ug/mL-FEU — ABNORMAL HIGH (ref 0.00–0.50)

## 2020-07-21 LAB — C-REACTIVE PROTEIN: CRP: 17.5 mg/dL — ABNORMAL HIGH (ref <1.0)

## 2020-07-21 LAB — APTT: aPTT: 31 seconds (ref 24–36)

## 2020-07-21 MED ORDER — FENTANYL 2500MCG IN NS 250ML (10MCG/ML) PREMIX INFUSION
0.0000 ug/h | INTRAVENOUS | Status: DC
  Administered 2020-07-21: 50 ug/h via INTRAVENOUS
  Administered 2020-07-22: 275 ug/h via INTRAVENOUS
  Administered 2020-07-22: 150 ug/h via INTRAVENOUS
  Administered 2020-07-22: 25 ug/h via INTRAVENOUS
  Administered 2020-07-23: 250 ug/h via INTRAVENOUS
  Filled 2020-07-21 (×4): qty 250

## 2020-07-21 MED ORDER — EPINEPHRINE 1 MG/10ML IJ SOSY
PREFILLED_SYRINGE | INTRAMUSCULAR | Status: AC
  Filled 2020-07-21: qty 10

## 2020-07-21 MED ORDER — SODIUM CHLORIDE 0.9 % IV SOLN
250.0000 [IU]/h | INTRAVENOUS | Status: DC
  Administered 2020-07-22 – 2020-07-25 (×3): 250 [IU]/h via INTRAVENOUS_CENTRAL
  Filled 2020-07-21 (×3): qty 10000
  Filled 2020-07-21: qty 2

## 2020-07-21 MED ORDER — MIDAZOLAM HCL 2 MG/2ML IJ SOLN
2.0000 mg | INTRAMUSCULAR | Status: DC | PRN
  Administered 2020-07-22: 2 mg via INTRAVENOUS
  Filled 2020-07-21: qty 2

## 2020-07-21 MED ORDER — SODIUM ZIRCONIUM CYCLOSILICATE 10 G PO PACK
10.0000 g | PACK | Freq: Two times a day (BID) | ORAL | Status: DC
  Administered 2020-07-21: 10 g via ORAL
  Filled 2020-07-21 (×3): qty 1

## 2020-07-21 MED ORDER — HEPARIN BOLUS VIA INFUSION (CRRT)
1000.0000 [IU] | INTRAVENOUS | Status: DC | PRN
Start: 2020-07-21 — End: 2020-07-21
  Filled 2020-07-21: qty 1000

## 2020-07-21 MED ORDER — FENTANYL CITRATE (PF) 100 MCG/2ML IJ SOLN
50.0000 ug | Freq: Once | INTRAMUSCULAR | Status: AC
  Administered 2020-07-21: 50 ug via INTRAVENOUS

## 2020-07-21 MED ORDER — INSULIN DETEMIR 100 UNIT/ML ~~LOC~~ SOLN
5.0000 [IU] | Freq: Two times a day (BID) | SUBCUTANEOUS | Status: DC
  Administered 2020-07-21 (×2): 5 [IU] via SUBCUTANEOUS
  Filled 2020-07-21 (×2): qty 0.05

## 2020-07-21 MED ORDER — FREE WATER
200.0000 mL | Status: DC
  Administered 2020-07-21 – 2020-07-22 (×4): 200 mL

## 2020-07-21 MED ORDER — PRISMASOL BGK 0/2.5 32-2.5 MEQ/L IV SOLN
INTRAVENOUS | Status: DC
  Filled 2020-07-21 (×8): qty 5000

## 2020-07-21 MED ORDER — VITAL HIGH PROTEIN PO LIQD
1000.0000 mL | ORAL | Status: AC
  Administered 2020-07-21: 1000 mL

## 2020-07-21 MED ORDER — FENTANYL BOLUS VIA INFUSION
50.0000 ug | INTRAVENOUS | Status: DC | PRN
  Administered 2020-07-21 – 2020-07-22 (×7): 50 ug via INTRAVENOUS
  Filled 2020-07-21: qty 50

## 2020-07-21 MED ORDER — PRISMASOL BGK 4/2.5 32-4-2.5 MEQ/L IV SOLN
INTRAVENOUS | Status: DC
Start: 2020-07-21 — End: 2020-07-26

## 2020-07-21 MED ORDER — MIDAZOLAM HCL 2 MG/2ML IJ SOLN: 2.0000 mg | INTRAMUSCULAR | Status: DC | PRN

## 2020-07-21 MED ORDER — ETOMIDATE 2 MG/ML IV SOLN
INTRAVENOUS | Status: AC
  Administered 2020-07-21: 20 mg
  Filled 2020-07-21: qty 20

## 2020-07-21 MED ORDER — ROCURONIUM BROMIDE 10 MG/ML (PF) SYRINGE
PREFILLED_SYRINGE | INTRAVENOUS | Status: AC
  Administered 2020-07-21: 100 mg
  Filled 2020-07-21: qty 10

## 2020-07-21 MED ORDER — PRISMASOL BGK 0/2.5 32-2.5 MEQ/L IV SOLN
INTRAVENOUS | Status: DC
  Filled 2020-07-21 (×6): qty 5000

## 2020-07-21 MED ORDER — ORAL CARE MOUTH RINSE
15.0000 mL | Freq: Two times a day (BID) | OROMUCOSAL | Status: DC
  Administered 2020-07-21 – 2020-07-23 (×4): 15 mL via OROMUCOSAL

## 2020-07-21 MED ORDER — PHENYLEPHRINE 40 MCG/ML (10ML) SYRINGE FOR IV PUSH (FOR BLOOD PRESSURE SUPPORT)
PREFILLED_SYRINGE | INTRAVENOUS | Status: AC
  Filled 2020-07-21: qty 10

## 2020-07-21 MED ORDER — PROPOFOL 1000 MG/100ML IV EMUL
0.0000 ug/kg/min | INTRAVENOUS | Status: DC
  Administered 2020-07-21: 5 ug/kg/min via INTRAVENOUS
  Administered 2020-07-21: 20 ug/kg/min via INTRAVENOUS
  Administered 2020-07-22 (×2): 25 ug/kg/min via INTRAVENOUS
  Administered 2020-07-22: 50 ug/kg/min via INTRAVENOUS
  Filled 2020-07-21 (×4): qty 100

## 2020-07-21 MED ORDER — HEPARIN (PORCINE) 2000 UNITS/L FOR CRRT
INTRAVENOUS_CENTRAL | Status: DC | PRN
  Filled 2020-07-21 (×8): qty 1000

## 2020-07-21 MED ORDER — MIDAZOLAM HCL 2 MG/2ML IJ SOLN
INTRAMUSCULAR | Status: AC
  Administered 2020-07-21: 2 mg
  Filled 2020-07-21: qty 4

## 2020-07-21 MED ORDER — SUCCINYLCHOLINE CHLORIDE 200 MG/10ML IV SOSY
PREFILLED_SYRINGE | INTRAVENOUS | Status: AC
  Filled 2020-07-21: qty 10

## 2020-07-21 MED ORDER — VECURONIUM BROMIDE 10 MG IV SOLR
INTRAVENOUS | Status: AC
  Filled 2020-07-21: qty 10

## 2020-07-21 MED ORDER — DOCUSATE SODIUM 50 MG/5ML PO LIQD
100.0000 mg | Freq: Two times a day (BID) | ORAL | Status: DC
  Administered 2020-07-21 – 2020-07-23 (×3): 100 mg
  Filled 2020-07-21 (×3): qty 10

## 2020-07-21 MED ORDER — ORAL CARE MOUTH RINSE
15.0000 mL | OROMUCOSAL | Status: DC
  Administered 2020-07-21 – 2020-08-10 (×196): 15 mL via OROMUCOSAL

## 2020-07-21 MED ORDER — SODIUM BICARBONATE-DEXTROSE 150-5 MEQ/L-% IV SOLN
150.0000 meq | INTRAVENOUS | Status: AC
  Administered 2020-07-22 (×2): 150 meq via INTRAVENOUS
  Filled 2020-07-21 (×3): qty 1000

## 2020-07-21 MED ORDER — SODIUM ZIRCONIUM CYCLOSILICATE 10 G PO PACK
10.0000 g | PACK | Freq: Once | ORAL | Status: DC
  Filled 2020-07-21: qty 1

## 2020-07-21 MED ORDER — CHLORHEXIDINE GLUCONATE 0.12% ORAL RINSE (MEDLINE KIT)
15.0000 mL | Freq: Two times a day (BID) | OROMUCOSAL | Status: DC
  Administered 2020-07-21 – 2020-08-10 (×40): 15 mL via OROMUCOSAL

## 2020-07-21 MED ORDER — POLYETHYLENE GLYCOL 3350 17 G PO PACK
17.0000 g | PACK | Freq: Every day | ORAL | Status: DC
  Administered 2020-07-23: 17 g
  Filled 2020-07-21: qty 1

## 2020-07-21 MED ORDER — PROSOURCE TF PO LIQD
45.0000 mL | Freq: Two times a day (BID) | ORAL | Status: DC
  Administered 2020-07-21 – 2020-07-22 (×2): 45 mL
  Filled 2020-07-21 (×2): qty 45

## 2020-07-21 MED ORDER — FENTANYL CITRATE (PF) 100 MCG/2ML IJ SOLN
INTRAMUSCULAR | Status: AC
  Filled 2020-07-21: qty 2

## 2020-07-21 MED ORDER — PROPOFOL 1000 MG/100ML IV EMUL
5.0000 ug/kg/min | INTRAVENOUS | Status: DC
  Filled 2020-07-21: qty 100

## 2020-07-21 MED ORDER — HEPARIN SODIUM (PORCINE) 1000 UNIT/ML DIALYSIS
1000.0000 [IU] | INTRAMUSCULAR | Status: DC | PRN
  Administered 2020-07-22 – 2020-07-25 (×2): 2400 [IU] via INTRAVENOUS_CENTRAL
  Filled 2020-07-21 (×3): qty 6
  Filled 2020-07-21: qty 2
  Filled 2020-07-21: qty 1

## 2020-07-21 NOTE — Progress Notes (Addendum)
Received call from PCCM  Was intubated this afternoon Pt maximally ventilated RR 35  ABG showing pH of 7.15 Labs from this AM with sig AKI and K 5.8 Needs to start CRRT  Orders placed and updated labs ordered too Bicarb gtt ordered to temporize for now Will use flat rate intra-circuit heparin infusion of 200 u / hr to start and follow APTT 2K pre/ post filter and 4K dialysate while we await updated K PCCM to place line- appreciate assistance. D/w Drs. Stretch and [facility] pgr [phone]

## 2020-07-21 NOTE — Consult Note (Signed)
Reason for Consult: AKI Referring Physician: Lake Bells, MD  Alex [name] is an 64 y.o. male with a PMH significant for longstanding DM type II, HTN, HLD, possible CKD (patient is a poor historian with little insight) who was brought by family to [facility] ED with a week long history of generalized weakness, shortness of breath, and worsening mental status.  In the ED he was febrile and hypoxic.  CXR revealed bilateral interstitial and alveolar infiltrates and was covid +.  He was admitted with acute hypoxic respiratory failure due to covid pneumonia.  We were consulted due to progressive AKI.  It is unclear what his baseline Scr is but he thinks he was told that he had some kidney "problems" in the past but could not tell me what stage of CKD he was told. The trend in Scr is seen below.  Of note, he had been on losartan and metformin prior to admission.  Trend in Creatinine: Creatinine, Ser  Date/Time Value Ref Range Status  07/21/2020 03:07 AM 8.43 (H) 0.61 - 1.24 mg/dL Final  07/21/2020 12:06 AM 8.52 (H) 0.61 - 1.24 mg/dL Final  08/12/2020 04:00 PM 8.12 (H) 0.61 - 1.24 mg/dL Final  08/16/2020 12:48 PM 7.90 (H) 0.61 - 1.24 mg/dL Final    PMH:  No past medical history on file.  PSH:  No past surgical history on file  Allergies: No Known Allergies  Medications:   Prior to Admission medications   Medication Sig Start Date End Date Taking? Authorizing Provider  Acetaminophen (TYLENOL PO) Take 1-2 tablets by mouth every 6 (six) hours as needed (pain/fever/headache).   Yes [provider]  ezetimibe (ZETIA) 10 MG tablet Take 10 mg by mouth at bedtime. Patient not taking: Reported on 07/28/2020 03/08/20   [provider]  losartan (COZAAR) 100 MG tablet Take 100 mg by mouth daily. Patient not taking: Reported on 08/13/2020 03/08/20   [provider]  metFORMIN (GLUCOPHAGE) 1000 MG tablet Take 1,000 mg by mouth 2 (two) times daily. Patient not taking: Reported on 08/17/2020  03/08/20   [provider]  pantoprazole (PROTONIX) 40 MG tablet Take 40 mg by mouth daily. Patient not taking: Reported on 08/11/2020 03/08/20   [provider]  pioglitazone (ACTOS) 45 MG tablet Take 45 mg by mouth daily. Patient not taking: Reported on 07/21/2020 03/08/20   [provider]  tiZANidine (ZANAFLEX) 4 MG tablet Take 4 mg by mouth 2 (two) times daily. Patient not taking: Reported on 08/14/2020 03/08/20   [provider]    Inpatient medications: . vitamin C  500 mg Oral Daily  . Chlorhexidine Gluconate Cloth  6 each Topical Daily  . dexamethasone (DECADRON) injection  6 mg Intravenous Q24H  . heparin  5,000 Units Subcutaneous Q8H  . insulin aspart  0-9 Units Subcutaneous TID WC  . insulin detemir  5 Units Subcutaneous BID  . mouth rinse  15 mL Mouth Rinse BID  . sodium chloride flush  3 mL Intravenous Q12H  . sodium zirconium cyclosilicate  10 g Oral Once  . zinc sulfate  220 mg Oral Daily    Discontinued Meds:   Medications Discontinued During This Encounter  Medication Reason  . remdesivir 200 mg in sodium chloride 0.9% 250 mL IVPB   . remdesivir 100 mg in sodium chloride 0.9 % 100 mL IVPB   . MEDLINE mouth rinse Duplicate    Social History:  has no history on file for tobacco use, alcohol use, and drug use.  Family History:  No family history on file.  Pertinent items are noted in HPI. Weight change:   Intake/Output Summary (Last 24 hours) at 07/21/2020 1300 Last data filed at 07/21/2020 1025 Gross per 24 hour  Intake 1829.18 ml  Output --  Net 1829.18 ml   BP (!) 141/86   Pulse 67   Temp (!) 97 F (36.1 C) (Axillary)   Resp (!) 34   Ht 6\' 5"  (1.956 m)   Wt (!) 109.9 kg   SpO2 92%   BMI 28.72 kg/m  Vitals:   07/21/20 1000 07/21/20 1100 07/21/20 1136 07/21/20 1200  BP: (!) 143/82 (!) 140/86  (!) 141/86  Pulse: 63 62  67  Resp: (!) 35 (!) 36  (!) 34  Temp:   (!) 97 F (36.1 C)   TempSrc:   Axillary   SpO2: (!) 88% 91%   92%  Weight:      Height:         General appearance: mild distress, mildly obese, uncooperative and wearing non-rebreather and + paradoxical respirations Head: Normocephalic, without obvious abnormality, atraumatic Eyes: negative findings: lids and lashes normal, conjunctivae and sclerae normal and corneas clear Resp: rales bibasilar and tachypnea Cardio: regular rate and rhythm, S1, S2 normal, no murmur, click, rub or gallop GI: soft, non-tender; bowel sounds normal; no masses,  no organomegaly and some abdominal distension Extremities: no edema but extremities are cool to touch  Labs: Basic Metabolic Panel: Recent Labs  Lab 07/25/2020 1248 07/23/2020 1600 07/21/20 0006 07/21/20 0307  NA 133* 135 135 136  K 5.0 5.2* 5.4* 5.8*  CL 97* 96* 102 101  CO2 16* 17* 18* 19*  GLUCOSE 184* 182* 247* 260*  BUN 101* 99* 86* 120*  CREATININE 7.90* 8.12* 8.52* 8.43*  ALBUMIN 2.5* 2.5*  --  2.1*  CALCIUM 8.3* 8.5* 7.5* 7.4*  PHOS  --   --   --  7.9*   Liver Function Tests: Recent Labs  Lab 08/18/2020 1248 08/02/2020 1600 07/21/20 0307  AST 113* 108* 83*  ALT 46* 48* 40  ALKPHOS 59 59 57  BILITOT 0.4 0.3 0.3  PROT 7.7 7.8 6.4*  ALBUMIN 2.5* 2.5* 2.1*   No results for input(s): LIPASE, AMYLASE in the last 168 hours. No results for input(s): AMMONIA in the last 168 hours. CBC: Recent Labs  Lab 08/14/2020 1248 07/21/20 0307  WBC 5.6 2.8*  NEUTROABS 4.2 2.1  HGB 17.1* 13.8  HCT 51.2 42.5  MCV 83.8 87.1  PLT 226 185   PT/INR: @LABRCNTIP (inr:5) Cardiac Enzymes: )No results for input(s): CKTOTAL, CKMB, CKMBINDEX, TROPONINI in the last 168 hours. CBG: Recent Labs  Lab 08/17/2020 1728 08/09/2020 1919 07/25/2020 2111 07/21/20 0801 07/21/20 1132  GLUCAP 197* 263* 271* 214* 165*    Iron Studies:  Recent Labs  Lab 08/14/2020 1248 07/21/20 0307  FERRITIN 1,711* 2,093*    Xrays/Other Studies: DG Chest Port 1 View  Result Date: 07/21/2020 CLINICAL DATA:  SOB, concern for Covid,  Tachypnea. EXAM: PORTABLE CHEST 1 VIEW COMPARISON:  None. FINDINGS: Cardiomegaly. Prominent interstitial markings suggesting vascular congestion/interstitial edema. More confluent airspace opacities at the lung bases. No pleural effusion or pneumothorax is seen. Osseous structures about the chest are unremarkable. IMPRESSION: 1. Multifocal pneumonia versus pulmonary edema. 2. Cardiomegaly. Electronically Signed   By: Franki Cabot M.D.   On: 08/18/2020 13:45     Assessment/Plan: 1.  AKI vs progressive CKD - in setting of covid pneumonia and concomitant ARB.  Unclear amount of  UOP as he has not been collecting it but reports urinating 3 x today.  No baseline Scr known.  He has poor medical literacy and insight.  I discussed the severity of his kidney disease and the potential need for dialysis, however he did not grasp the seriousness of his condition and just wants to leave.  I did tell him that he could die from kidney failure without treatment.  He has refused lokelma but will try again today.  He will likely require initiation of CRRT in the next 24 hours if he doesn't show any significant improvement.  I spoke with Dr. Lake Bells and voiced my concern that he may decline dialysis and recommended Palliative care consult to help set goals/limits of care. 1. Will order urine studies, and renal US is pending.  2. Continue to hold metformin and losartan 3. Continue to follow UOP and renal function 4. Plan for CRRT tomorrow if he does not improve significantly (if he is amenable).  2. Acute hypoxic respiratory failure with ARDS due to covid pneumonia.  Will likely require intubation soon as he is starting to have paradoxical breathing. 3. covid pneumonia- unvaccinated smoker.  He has been treated with decadron, rocephin, remdesivir per PCCM 4. Hyperkalemia- due to #1.  Per PCCM he has refused lokelma yesterday.  Will try again today.  Discussed the possible need to initiate dialysis in the next 24  hours. 5. Metabolic acidosis- due to #1.  Improving. 6. Disposition- if he continues to refuse treatment, would recommend palliative care consult to help set goals/limits of care.   Broadus John A Tyasia Packard 07/21/2020, 1:00 PM

## 2020-07-21 NOTE — Progress Notes (Signed)
[facility] PCCM  Called to bedside due to severe hypoxemia and bradycardia which occurred when the patient removed his NRB mask. He was unresponsive for a few minutes.  His HR and O2 saturation have improved, however considering the severity of this episode (near identical to what happened yesterday) he needs to be intubated to prevent a cardiac arrest in the very likely event that he becomes hypoxemic again.  We discussed with the patient and his mother and they both agree.  [name], MD [place] P[facility]M Pager: [phone] Cell: [phone] If no response, call [phone]

## 2020-07-21 NOTE — Progress Notes (Signed)
Bilateral lower extremity venous duplex has been completed. Preliminary results can be found in CV Proc through chart review.  Results were given to the patient's nurse, Lauren.  07/21/20 3:20 PM Alex [name] RVT

## 2020-07-21 NOTE — Progress Notes (Signed)
eLink Physician-Brief Progress Note Patient Name: Alex [name] DOB: [birth year] MRN: [id number]   Date of Service  07/21/2020  HPI/Events of Note  Hyperkalemia - K+ = 5.4. T wave not peaked on bedside monitor .  eICU Interventions  Plan: 1. Lokelma 10 gm PO now. 2. Repeat BMP at 5 AM.     Intervention Category Major Interventions: Electrolyte abnormality - evaluation and management  Sascha Baugher Eugene 07/21/2020, 1:09 AM

## 2020-07-21 NOTE — Procedures (Addendum)
Central Venous Catheter Insertion Procedure Note DAQUAVION CATALA [id number] 1956-12-14  Procedure: Insertion of Central Venous Catheter Indications: Drug and/or fluid administration  Procedure Details Consent: Risks of procedure as well as the alternatives and risks of each were explained to the (patient/caregiver).  Consent for procedure obtained. Time Out: Verified patient identification, verified procedure, site/side was marked, verified correct patient position, special equipment/implants available, medications/allergies/relevent history reviewed, required imaging and test results available.  Performed  Maximum sterile technique was used including antiseptics, cap, gloves, gown, hand hygiene, mask and sheet. Skin prep: Chlorhexidine; local anesthetic administered A antimicrobial bonded/coated triple lumen catheter was placed in the left internal jugular vein using the Seldinger technique. Left subclavian attempted, could not access vein, small hematoma formed.  Ultrasound was used to verify the patency of the vein and for real time needle guidance.  Evaluation Blood flow good Complications: No apparent complications Patient did tolerate procedure well. Chest X-ray ordered to verify placement.  CXR: pending.  [name] 07/21/2020, 5:17 PM

## 2020-07-21 NOTE — Procedures (Signed)
Intubation Procedure Note Alex [name] [id number] March 21, 1956  Procedure: Intubation Indications: Respiratory insufficiency  Procedure Details Consent: Risks of procedure as well as the alternatives and risks of each were explained to the (patient/caregiver).  Consent for procedure obtained. Time Out: Verified patient identification, verified procedure, site/side was marked, verified correct patient position, special equipment/implants available, medications/allergies/relevent history reviewed, required imaging and test results available.  Performed  Drugs Versed 71m, Fentanyl 558m, Etomidate 2056mRocuronium 100m36mL x 1 with MAC 4 blade Grade 1 view 8.0 ET tube passed through cords under direct visualization Placement confirmed with bilateral breath sounds, positive EtCO2 change and smoke in tube   Evaluation Hemodynamic Status: BP stable throughout; O2 sats: transiently fell during during procedure Patient's Current Condition: stable Complications: No apparent complications Patient did tolerate procedure well. Chest X-ray ordered to verify placement.  CXR: pending.   BrenRoselie [name]/2021

## 2020-07-21 NOTE — Progress Notes (Signed)
PCCM:  I called every number in the chart.  Case discussed with Dr. Hollie Salk.  We will proceed with HD catheter placement for initiation of CVVHD.   Garner Nash, DO [facility] 07/21/2020 10:59 PM

## 2020-07-21 NOTE — Progress Notes (Signed)
eLink Physician-Brief Progress Note Patient Name: Alex [name] DOB: 1956-03-08 MRN: [id number]   Date of Service  07/21/2020  HPI/Events of Note  ABG from 1837: 7.15/43/166/13.9.  Reviewed renal note that discusses starting CRRT tomorrow.  Patient has since been intubated and is maximally ventilated (RR 35, VT 6cc/kg - COVID+) so no ability to further compensate from respiratory standpoint.   eICU Interventions  Discussed with nephrology who is in agreement with starting CRRT now. I tried contacting the patient's mother as well as his brother for consent, but I was not able to reach either one at the phone numbers in the chart.  Nephrology attending and I believe insertion of dialysis catheter and starting CRRT is emergent at this time, so we will proceed and update the family as soon as they can be contacted.  Will ask bedside team to place HD catheter.     Intervention Category Major Interventions: Acid-Base disturbance - evaluation and management  Marily Lente Mathius [place] 07/21/2020, 10:09 PM

## 2020-07-21 NOTE — TOC Initial Note (Signed)
Transition of Care [facility]) - Initial/Assessment Note    Patient Details  Name: Alex [name] MRN: [id number] Date of Birth: 10/01/1956  [facility]) CM/SW Contact:    Leeroy Cha, RN Phone Number: 07/21/2020, 7:53 AM  Clinical Narrative:                 Pt admitted with positive covid screen, smoker and unvacatinated.  Iv decadron, iv rocephin, lr 150cc/hr,remdesivir through 517001.  D.dimer and crp elevated wbc 2.8. Plan-follow for toc needs and progression. Expected Discharge Plan: Home/Self Care Barriers to Discharge: Continued Medical Work up   Patient Goals and CMS Choice Patient states their goals for this hospitalization and ongoing recovery are:: to go home CMS Medicare.gov Compare Post Acute Care list provided to:: Patient    Expected Discharge Plan and Services Expected Discharge Plan: Home/Self Care       Living arrangements for the past 2 months: Single Family Home                                      Prior Living Arrangements/Services Living arrangements for the past 2 months: Single Family Home Lives with:: Self Patient language and need for interpreter reviewed:: Yes Do you feel safe going back to the place where you live?: Yes      Need for Family Participation in Patient Care: Yes (Comment) Care giver support system in place?: Yes (comment)   Criminal Activity/Legal Involvement Pertinent to Current Situation/Hospitalization: No - Comment as needed  Activities of Daily Living      Permission Sought/Granted                  Emotional Assessment Appearance:: Appears stated age     Orientation: : Oriented to Place, Oriented to Self, Oriented to  Time, Oriented to Situation Alcohol / Substance Use: Tobacco Use Psych Involvement: No (comment)  Admission diagnosis:  Acute respiratory failure with hypoxia (HCC) [J96.01] Acute renal failure, unspecified acute renal failure type ([place]) [N17.9] Acute hypoxemic respiratory  failure due to COVID-19 ([place]) [U07.1, J96.01] COVID-19 [U07.1] Patient Active Problem List   Diagnosis Date Noted  . Acute hypoxemic respiratory failure due to COVID-19 [facility]) 07/27/2020   PCP:  Lorelee Market, MD Pharmacy:   [facility] [address] [place] [address] Phone: [phone] Fax: [phone]     Social Determinants of Health (SDOH) Interventions    Readmission Risk Interventions No flowsheet data found.

## 2020-07-21 NOTE — Progress Notes (Signed)
Inpatient Diabetes Program Recommendations  AACE/ADA: New Consensus Statement on Inpatient Glycemic Control (2015)  Target Ranges:  Prepandial:   less than 140 mg/dL      Peak postprandial:   less than 180 mg/dL (1-2 hours)      Critically ill patients:  140 - 180 mg/dL   Lab Results  Component Value Date   GLUCAP 214 (H) 07/21/2020    Review of Glycemic Control Results for COSTON, MANDATO (MRN [id number]) as of 07/21/2020 11:11  Ref. Range 08/11/2020 17:28 08/16/2020 19:19 08/16/2020 21:11 07/21/2020 08:01  Glucose-Capillary Latest Ref Range: 70 - 99 mg/dL 197 (H) 263 (H) 271 (H) 214 (H)   Diabetes history: None Outpatient Diabetes medications:  None Current orders for Inpatient glycemic control:  Novolog sensitive tid with meals Decadron 6 mg q 24 hours  Inpatient Diabetes Program Recommendations:    Note blood sugars increased with steroids.  Consider increasing Novolog correction to q 4 hours.  Also may consider adding Levemir 5 units daily.   Thanks  Adah Perl, RN, BC-ADM Inpatient Diabetes Coordinator Pager [phone] (8a-5p)

## 2020-07-21 NOTE — Progress Notes (Signed)
eLink Physician-Brief Progress Note Patient Name: Alex [name] DOB: [birth year] MRN: [id number]   Date of Service  07/21/2020  HPI/Events of Note  Hyperkalemia - K+ = 5.4. Lokelma ordered. Patient refused to take it.   eICU Interventions  Plan: 1. Repeat K+ at 5 AM.      Intervention Category Major Interventions: Electrolyte abnormality - evaluation and management  Placido Hangartner Eugene 07/21/2020, 2:48 AM

## 2020-07-21 NOTE — Progress Notes (Signed)
NAME:  Alex [name], MRN:  [id number], DOB:  04-05-1956, LOS: 1 ADMISSION DATE:  08/14/2020, CONSULTATION DATE:  8/1 REFERRING MD:  Sedonia Small, CHIEF COMPLAINT:  Dyspnea   Brief History   64 y/o male with acute hypoxemic respiratory failure due to COVID 19 pneumonia admitted on 8/1  Past Medical History  Cigarette smoker  Significant Hospital Events   8/1 admission  Consults:  Nephrology  Procedures:    Significant Diagnostic Tests:  8/2 lower ext doppler/vascular ultrasound >> 8/2 renal ultrasound >>  Micro Data:  8/1 SARS COV 2 > positive 8/1 blood >   Antimicrobials/COVID Rx:  8/1 ceftriaxone >  8/1 remdesivir >  8/1 tocilizumab   Interim history/subjective:   Wants to eat Says he has to urinate  Objective   Blood pressure (!) 139/92, pulse 65, temperature (!) 97.3 F (36.3 C), temperature source Axillary, resp. rate (!) 31, height 6\' 5"  (1.956 m), weight (!) 109.9 kg, SpO2 91 %.    FiO2 (%):  [80 %-100 %] 100 %   Intake/Output Summary (Last 24 hours) at 07/21/2020 6294 Last data filed at 07/21/2020 0600 Gross per 24 hour  Intake 1072.7 ml  Output --  Net 1072.7 ml   Filed Weights   07/28/2020 1849 07/21/20 0500  Weight: (!) 108.9 kg (!) 109.9 kg    Examination:  General:  Resting comfortably in bed HENT: NCAT OP clear PULM: Crackles bases B, tachypnea, no accessory muscle use CV: RRR, no mgr GI: BS+, soft, nontender MSK: normal bulk and tone Neuro: awake, alert, MAEW   8/1 CXR images personally reviewed> bilateral airspace disease, bilateral effusions  Resolved Hospital Problem list     Assessment & Plan:  ARDS due to COVID 19 pneumonia Tolerate periods of hypoxemia, goal at rest is greater than 85% SaO2, with movement ideally above 75% Decision for intubation should be based on a change in mental status or physical evidence of ventilatory failure such as nasal flaring, accessory muscle use, paradoxical breathing Out of bed to chair as  able Incentive spirometry is important, use every hour Prone positioning while in bed 8/2 plan: continue heated high flow, Decadron, remedesivir per protocol  AKI with hyperkalemia Hyperkalemia BPH lokelma Renal consult today Renal ultrasound now Monitor UOP May need foley Monitor labs q12 shift, may need HD soon  Hyperglycemia SSI  Elevated D-dimer, COVID, at risk for PE/DVT Check lower extremity doppler   Best practice:  Diet: regular Pain/Anxiety/Delirium protocol (if indicated): n/a VAP protocol (if indicated): n/a DVT prophylaxis: sub cutaneous heparin GI prophylaxis: n/a Glucose control: SSI Mobility: bed rest Code Status: full Family Communication: the patient says he is updating his mother and preferred to call her himself when I asked about calling family Disposition: remain in ICU  Labs   CBC: Recent Labs  Lab 08/19/2020 1248 07/21/20 0307  WBC 5.6 2.8*  NEUTROABS 4.2 2.1  HGB 17.1* 13.8  HCT 51.2 42.5  MCV 83.8 87.1  PLT 226 765    Basic Metabolic Panel: Recent Labs  Lab 08/09/2020 1248 08/14/2020 1600 07/21/20 0006  NA 133* 135 135  K 5.0 5.2* 5.4*  CL 97* 96* 102  CO2 16* 17* 18*  GLUCOSE 184* 182* 247*  BUN 101* 99* 86*  CREATININE 7.90* 8.12* 8.52*  CALCIUM 8.3* 8.5* 7.5*   GFR: Estimated Creatinine Clearance: 12.2 mL/min (A) (by C-G formula based on SCr of 8.52 mg/dL (H)). Recent Labs  Lab 08/19/2020 1248 08/02/2020 1624 07/21/20 0307  PROCALCITON 4.75  --   --  WBC 5.6  --  2.8*  LATICACIDVEN 2.1* 1.3  --     Liver Function Tests: Recent Labs  Lab 08/15/2020 1248 08/16/2020 1600  AST 113* 108*  ALT 46* 48*  ALKPHOS 59 59  BILITOT 0.4 0.3  PROT 7.7 7.8  ALBUMIN 2.5* 2.5*   No results for input(s): LIPASE, AMYLASE in the last 168 hours. No results for input(s): AMMONIA in the last 168 hours.  ABG    Component Value Date/Time   PHART 7.310 (L) 07/27/2020 1250   PCO2ART 27.0 (L) 07/28/2020 1250   PO2ART 78.0 (L) 08/14/2020  1250   HCO3 13.2 (L) 07/28/2020 1250   ACIDBASEDEF 11.3 (H) 08/02/2020 1250   O2SAT 93.6 07/24/2020 1250     Coagulation Profile: No results for input(s): INR, PROTIME in the last 168 hours.  Cardiac Enzymes: No results for input(s): CKTOTAL, CKMB, CKMBINDEX, TROPONINI in the last 168 hours.  HbA1C: No results found for: HGBA1C  CBG: Recent Labs  Lab 08/09/2020 1728 07/30/2020 1919 07/29/2020 2111  GLUCAP 197* 263* 271*     Critical care time: 35 minutes    [name], MD [facility] Pager: [phone] Cell: [phone] If no response, call [phone]

## 2020-07-22 DIAGNOSIS — N179 Acute kidney failure, unspecified: Secondary | ICD-10-CM

## 2020-07-22 LAB — CBC WITH DIFFERENTIAL/PLATELET
Abs Immature Granulocytes: 0.06 10*3/uL (ref 0.00–0.07)
Basophils Absolute: 0 10*3/uL (ref 0.0–0.1)
Basophils Relative: 0 %
Eosinophils Absolute: 0 10*3/uL (ref 0.0–0.5)
Eosinophils Relative: 0 %
HCT: 42.8 % (ref 39.0–52.0)
Hemoglobin: 13.8 g/dL (ref 13.0–17.0)
Immature Granulocytes: 1 %
Lymphocytes Relative: 8 %
Lymphs Abs: 0.5 10*3/uL — ABNORMAL LOW (ref 0.7–4.0)
MCH: 28 pg (ref 26.0–34.0)
MCHC: 32.2 g/dL (ref 30.0–36.0)
MCV: 87 fL (ref 80.0–100.0)
Monocytes Absolute: 0.3 10*3/uL (ref 0.1–1.0)
Monocytes Relative: 4 %
Neutro Abs: 5.8 10*3/uL (ref 1.7–7.7)
Neutrophils Relative %: 87 %
Platelets: 242 10*3/uL (ref 150–400)
RBC: 4.92 MIL/uL (ref 4.22–5.81)
RDW: 13.5 % (ref 11.5–15.5)
WBC: 6.6 10*3/uL (ref 4.0–10.5)
nRBC: 0 % (ref 0.0–0.2)

## 2020-07-22 LAB — COMPREHENSIVE METABOLIC PANEL
ALT: 30 U/L (ref 0–44)
AST: 49 U/L — ABNORMAL HIGH (ref 15–41)
Albumin: 2.1 g/dL — ABNORMAL LOW (ref 3.5–5.0)
Alkaline Phosphatase: 65 U/L (ref 38–126)
Anion gap: 16 — ABNORMAL HIGH (ref 5–15)
BUN: 113 mg/dL — ABNORMAL HIGH (ref 8–23)
CO2: 20 mmol/L — ABNORMAL LOW (ref 22–32)
Calcium: 7.1 mg/dL — ABNORMAL LOW (ref 8.9–10.3)
Chloride: 100 mmol/L (ref 98–111)
Creatinine, Ser: 7.13 mg/dL — ABNORMAL HIGH (ref 0.61–1.24)
GFR calc Af Amer: 9 mL/min — ABNORMAL LOW (ref >60)
GFR calc non Af Amer: 7 mL/min — ABNORMAL LOW (ref >60)
Glucose, Bld: 276 mg/dL — ABNORMAL HIGH (ref 70–99)
Potassium: 4.9 mmol/L (ref 3.5–5.1)
Sodium: 136 mmol/L (ref 135–145)
Total Bilirubin: 0.5 mg/dL (ref 0.3–1.2)
Total Protein: 5.7 g/dL — ABNORMAL LOW (ref 6.5–8.1)

## 2020-07-22 LAB — GLUCOSE, CAPILLARY
Glucose-Capillary: 210 mg/dL — ABNORMAL HIGH (ref 70–99)
Glucose-Capillary: 210 mg/dL — ABNORMAL HIGH (ref 70–99)
Glucose-Capillary: 224 mg/dL — ABNORMAL HIGH (ref 70–99)
Glucose-Capillary: 224 mg/dL — ABNORMAL HIGH (ref 70–99)
Glucose-Capillary: 225 mg/dL — ABNORMAL HIGH (ref 70–99)
Glucose-Capillary: 264 mg/dL — ABNORMAL HIGH (ref 70–99)
Glucose-Capillary: 279 mg/dL — ABNORMAL HIGH (ref 70–99)
Glucose-Capillary: 279 mg/dL — ABNORMAL HIGH (ref 70–99)
Glucose-Capillary: 301 mg/dL — ABNORMAL HIGH (ref 70–99)

## 2020-07-22 LAB — BLOOD GAS, ARTERIAL
Acid-base deficit: 9.3 mmol/L — ABNORMAL HIGH (ref 0.0–2.0)
Acid-base deficit: 5.9 mmol/L — ABNORMAL HIGH (ref 0.0–2.0)
Bicarbonate: 18.4 mmol/L — ABNORMAL LOW (ref 20.0–28.0)
Bicarbonate: 21.4 mmol/L (ref 20.0–28.0)
FIO2: 100
O2 Saturation: 98.3 %
O2 Saturation: 99.1 %
Patient temperature: 93.7
Patient temperature: 98.6
pCO2 arterial: 42.1 mmHg (ref 32.0–48.0)
pCO2 arterial: 51.6 mmHg — ABNORMAL HIGH (ref 32.0–48.0)
pH, Arterial: 7.244 — ABNORMAL LOW (ref 7.350–7.450)
pH, Arterial: 7.24 — ABNORMAL LOW (ref 7.350–7.450)
pO2, Arterial: 139 mmHg — ABNORMAL HIGH (ref 83.0–108.0)
pO2, Arterial: 179 mmHg — ABNORMAL HIGH (ref 83.0–108.0)

## 2020-07-22 LAB — RENAL FUNCTION PANEL
Albumin: 1.9 g/dL — ABNORMAL LOW (ref 3.5–5.0)
Anion gap: 12 (ref 5–15)
BUN: 65 mg/dL — ABNORMAL HIGH (ref 8–23)
CO2: 23 mmol/L (ref 22–32)
Calcium: 6.5 mg/dL — ABNORMAL LOW (ref 8.9–10.3)
Chloride: 103 mmol/L (ref 98–111)
Creatinine, Ser: 3.89 mg/dL — ABNORMAL HIGH (ref 0.61–1.24)
GFR calc Af Amer: 18 mL/min — ABNORMAL LOW (ref >60)
GFR calc non Af Amer: 15 mL/min — ABNORMAL LOW (ref >60)
Glucose, Bld: 239 mg/dL — ABNORMAL HIGH (ref 70–99)
Phosphorus: 4.8 mg/dL — ABNORMAL HIGH (ref 2.5–4.6)
Potassium: 4.1 mmol/L (ref 3.5–5.1)
Sodium: 138 mmol/L (ref 135–145)

## 2020-07-22 LAB — HEPARIN LEVEL (UNFRACTIONATED): Heparin Unfractionated: 0.66 IU/mL (ref 0.30–0.70)

## 2020-07-22 LAB — FERRITIN: Ferritin: 1756 ng/mL — ABNORMAL HIGH (ref 24–336)

## 2020-07-22 LAB — TRIGLYCERIDES: Triglycerides: 332 mg/dL — ABNORMAL HIGH (ref <150)

## 2020-07-22 LAB — MAGNESIUM
Magnesium: 2.5 mg/dL — ABNORMAL HIGH (ref 1.7–2.4)
Magnesium: 2.3 mg/dL (ref 1.7–2.4)

## 2020-07-22 LAB — C-REACTIVE PROTEIN: CRP: 12.4 mg/dL — ABNORMAL HIGH (ref <1.0)

## 2020-07-22 LAB — PHOSPHORUS
Phosphorus: 8.1 mg/dL — ABNORMAL HIGH (ref 2.5–4.6)
Phosphorus: 4.9 mg/dL — ABNORMAL HIGH (ref 2.5–4.6)

## 2020-07-22 LAB — D-DIMER, QUANTITATIVE: D-Dimer, Quant: 17.16 ug/mL-FEU — ABNORMAL HIGH (ref 0.00–0.50)

## 2020-07-22 MED ORDER — MIDAZOLAM 50MG/50ML (1MG/ML) PREMIX INFUSION
0.5000 mg/h | INTRAVENOUS | Status: DC
  Administered 2020-07-22: 0.5 mg/h via INTRAVENOUS
  Administered 2020-07-23 (×2): 7 mg/h via INTRAVENOUS
  Administered 2020-07-23: 4.5 mg/h via INTRAVENOUS
  Administered 2020-07-24: 7.5 mg/h via INTRAVENOUS
  Administered 2020-07-24: 8 mg/h via INTRAVENOUS
  Filled 2020-07-22 (×7): qty 50

## 2020-07-22 MED ORDER — NEPRO/CARBSTEADY PO LIQD
1000.0000 mL | ORAL | Status: DC
  Filled 2020-07-22: qty 1000

## 2020-07-22 MED ORDER — ATROPINE SULFATE 1 MG/ML IJ SOLN
0.5000 mg | Freq: Once | INTRAMUSCULAR | Status: AC
  Filled 2020-07-22: qty 0.5

## 2020-07-22 MED ORDER — EPINEPHRINE 1 MG/10ML IJ SOSY
PREFILLED_SYRINGE | INTRAMUSCULAR | Status: AC
  Filled 2020-07-22: qty 10

## 2020-07-22 MED ORDER — INSULIN DETEMIR 100 UNIT/ML ~~LOC~~ SOLN
5.0000 [IU] | Freq: Two times a day (BID) | SUBCUTANEOUS | Status: DC
  Administered 2020-07-22 – 2020-07-23 (×4): 5 [IU] via SUBCUTANEOUS
  Filled 2020-07-22 (×4): qty 0.05

## 2020-07-22 MED ORDER — METHYLPREDNISOLONE SODIUM SUCC 125 MG IJ SOLR
80.0000 mg | Freq: Three times a day (TID) | INTRAMUSCULAR | Status: DC
  Administered 2020-07-22 – 2020-07-24 (×7): 80 mg via INTRAVENOUS
  Filled 2020-07-22 (×7): qty 2

## 2020-07-22 MED ORDER — ATROPINE SULFATE 1 MG/10ML IJ SOSY
PREFILLED_SYRINGE | INTRAMUSCULAR | Status: AC
  Administered 2020-07-22: 1 mg
  Filled 2020-07-22: qty 10

## 2020-07-22 MED ORDER — SODIUM BICARBONATE-DEXTROSE 150-5 MEQ/L-% IV SOLN
150.0000 meq | INTRAVENOUS | Status: DC
  Administered 2020-07-24: 150 meq via INTRAVENOUS
  Filled 2020-07-22 (×5): qty 1000

## 2020-07-22 MED ORDER — INSULIN ASPART 100 UNIT/ML ~~LOC~~ SOLN
0.0000 [IU] | SUBCUTANEOUS | Status: DC
  Administered 2020-07-22 (×2): 8 [IU] via SUBCUTANEOUS
  Administered 2020-07-22 (×2): 5 [IU] via SUBCUTANEOUS
  Administered 2020-07-22: 11 [IU] via SUBCUTANEOUS
  Administered 2020-07-22: 5 [IU] via SUBCUTANEOUS
  Administered 2020-07-23: 3 [IU] via SUBCUTANEOUS
  Administered 2020-07-23: 5 [IU] via SUBCUTANEOUS
  Administered 2020-07-23: 11 [IU] via SUBCUTANEOUS

## 2020-07-22 MED ORDER — RENA-VITE PO TABS
1.0000 | ORAL_TABLET | Freq: Every day | ORAL | Status: DC
  Administered 2020-07-22 – 2020-07-24 (×3): 1
  Filled 2020-07-22 (×3): qty 1

## 2020-07-22 MED ORDER — VITAL HIGH PROTEIN PO LIQD
1000.0000 mL | ORAL | Status: DC
  Administered 2020-07-22 – 2020-07-23 (×2): 1000 mL

## 2020-07-22 MED ORDER — INSULIN ASPART 100 UNIT/ML ~~LOC~~ SOLN
5.0000 [IU] | SUBCUTANEOUS | Status: DC
  Administered 2020-07-22 – 2020-07-23 (×7): 5 [IU] via SUBCUTANEOUS

## 2020-07-22 MED ORDER — PROSOURCE TF PO LIQD
90.0000 mL | Freq: Every day | ORAL | Status: DC
  Filled 2020-07-22: qty 90

## 2020-07-22 MED ORDER — NOREPINEPHRINE 4 MG/250ML-% IV SOLN
0.0000 ug/min | INTRAVENOUS | Status: DC
  Administered 2020-07-22: 9 ug/min via INTRAVENOUS
  Administered 2020-07-22: 2 ug/min via INTRAVENOUS
  Administered 2020-07-23 – 2020-07-24 (×2): 4 ug/min via INTRAVENOUS
  Administered 2020-07-24: 2 ug/min via INTRAVENOUS
  Administered 2020-07-24: 15 ug/min via INTRAVENOUS
  Administered 2020-07-26: 14 ug/min via INTRAVENOUS
  Administered 2020-07-26: 12 ug/min via INTRAVENOUS
  Administered 2020-07-26: 11 ug/min via INTRAVENOUS
  Administered 2020-07-26: 15 ug/min via INTRAVENOUS
  Administered 2020-07-27: 4 ug/min via INTRAVENOUS
  Administered 2020-07-27: 3 ug/min via INTRAVENOUS
  Administered 2020-07-29: 6 ug/min via INTRAVENOUS
  Filled 2020-07-22 (×14): qty 250

## 2020-07-22 MED ORDER — SODIUM BICARBONATE 8.4 % IV SOLN
INTRAVENOUS | Status: AC
  Filled 2020-07-22 (×5): qty 150

## 2020-07-22 MED ORDER — MIDAZOLAM 50MG/50ML (1MG/ML) PREMIX INFUSION
INTRAVENOUS | Status: AC
  Filled 2020-07-22: qty 50

## 2020-07-22 MED ORDER — PROSOURCE TF PO LIQD
90.0000 mL | Freq: Every day | ORAL | Status: DC
  Administered 2020-07-22 – 2020-07-29 (×35): 90 mL
  Filled 2020-07-22 (×36): qty 90

## 2020-07-22 MED ORDER — HEPARIN (PORCINE) 25000 UT/250ML-% IV SOLN
1500.0000 [IU]/h | INTRAVENOUS | Status: DC
  Administered 2020-07-22 – 2020-07-23 (×2): 1500 [IU]/h via INTRAVENOUS
  Filled 2020-07-22 (×3): qty 250

## 2020-07-22 NOTE — Progress Notes (Signed)
eLink Physician-Brief Progress Note Patient Name: Alex [name] DOB: July 23, 1956 MRN: [id number]   Date of Service  07/22/2020  HPI/Events of Note  RN requests change to [facility] ISS regimen in this intubated patient.  eICU Interventions  Orders changed to Q4H.     Intervention Category Intermediate Interventions: Hyperglycemia - evaluation and treatment  Charlott Rakes 07/22/2020, 12:37 AM

## 2020-07-22 NOTE — Progress Notes (Signed)
NAME:  Alex [name], MRN:  [id number], DOB:  [birth year], LOS: 2 ADMISSION DATE:  07/29/2020, CONSULTATION DATE:  8/1 REFERRING MD:  Sedonia Small, CHIEF COMPLAINT:  Dyspnea   Brief History   64 y/o male with acute hypoxemic respiratory failure due to COVID 19 pneumonia admitted on 8/1  Past Medical History  Cigarette smoker  Significant Hospital Events   8/1 admission 8/2 intubation, start CRRT  Consults:  Nephrology  Procedures:  8/2 ETT >  8/2 L IJ CVL >  8/3 R IJ HD cath >   Significant Diagnostic Tests:  8/2 lower ext doppler/vascular ultrasound >> acute R DVT peroneal vein, left negative 8/2 renal ultrasound >> no hydro, findings consistent with medicorenal disease  Micro Data:  8/1 SARS COV 2 > positive 8/1 blood >   Antimicrobials/COVID Rx:  8/1 ceftriaxone >  8/1 remdesivir >  8/1 tocilizumab   Interim history/subjective:   Intubated yesterday Started CRRT for worsening acidosis  Objective   Blood pressure 110/72, pulse 67, temperature (!) 95 F (35 C), resp. rate 14, height 5\' 9"  (1.753 m), weight 112.5 kg, SpO2 91 %. CVP:  [7 mmHg-10 mmHg] 10 mmHg  Vent Mode: PRVC FiO2 (%):  [10 %-100 %] 100 % Set Rate:  [35 bmp] 35 bmp Vt Set:  [420 mL] 420 mL PEEP:  [12 cmH20] 12 cmH20 Plateau Pressure:  [29 cmH20-31 cmH20] 30 cmH20   Intake/Output Summary (Last 24 hours) at 07/22/2020 0240 Last data filed at 07/22/2020 0700 Gross per 24 hour  Intake 4169.74 ml  Output 1059 ml  Net 3110.74 ml   Filed Weights   07/30/2020 1849 07/21/20 0500 07/22/20 0324  Weight: (!) 108.9 kg (!) 109.9 kg 112.5 kg    Examination:  General:  In bed on vent HENT: NCAT ETT in place PULM: CTA B, vent supported breathing CV: RRR, no mgr GI: BS+, soft, nontender MSK: normal bulk and tone Neuro: sedated on vent  8/1 CXR images personally reviewed> bilateral airspace disease, bilateral effusions  Resolved Hospital Problem list     Assessment & Plan:  ARDS due to COVID 19  pneumonia Acute pulmonary edema due to acute renal failure Continue mechanical ventilation per ARDS protocol Target TVol 6-8cc/kgIBW Target Plateau Pressure < 30cm H20 Target driving pressure less than 15 cm of water Target PaO2 55-65: titrate PEEP/FiO2 per protocol As long as PaO2 to FiO2 ratio is less than 1:150 position in prone position for 16 hours a day Check CVP daily if CVL in place Target CVP less than 4, diurese as necessary Ventilator associated pneumonia prevention protocol 8/3 plan> plan prone today, change decadron to methylprednisolone 2gm/kg for 3 days, then prednisone 50mg  daily (Journal Intensive Care Medicine Volume: 36 issue: 6, page(s): 673-680; [facility] Infectious Diseases volume 21, Article number: 973 (2021)), continue remdesivir  Acute DVT R leg Start heparin infusion  AKI with hyperkalemia Hyperkalemia BPH CRRT to continue, would favor volume removal Monitor BMET and UOP Replace electrolytes as needed  Hyperglycemia SSI Add tube feeding coverage Continue levemir  Need for sedation for mechanical ventilation RASS target -2 to -3 Fentanyl infusion, propofol infusion per PAD protocol   Best practice:  Diet: regular Pain/Anxiety/Delirium protocol (if indicated): n/a VAP protocol (if indicated): n/a DVT prophylaxis: sub cutaneous heparin GI prophylaxis: n/a Glucose control: SSI Mobility: bed rest Code Status: full Family Communication: I spoke to his brother Herbie Baltimore by phone on 8/3 and gave him a clinical update Disposition: remain in ICU  Labs   CBC:  Recent Labs  Lab 07/29/2020 1248 07/21/20 0307 07/21/20 2301 07/22/20 0350  WBC 5.6 2.8* 6.2 6.6  NEUTROABS 4.2 2.1  --  5.8  HGB 17.1* 13.8 13.1 13.8  HCT 51.2 42.5 39.8 42.8  MCV 83.8 87.1 86.7 87.0  PLT 226 185 212 983    Basic Metabolic Panel: Recent Labs  Lab 08/11/2020 1600 07/21/20 0006 07/21/20 0307 07/21/20 2100 07/22/20 0350  NA 135 135 136 138 136  K 5.2* 5.4* 5.8* 5.6* 4.9  CL  96* 102 101 102 100  CO2 17* 18* 19* 15* 20*  GLUCOSE 182* 247* 260* 213* 276*  BUN 99* 86* 120* 105* 113*  CREATININE 8.12* 8.52* 8.43* 8.01* 7.13*  CALCIUM 8.5* 7.5* 7.4* 7.6* 7.1*  MG  --   --  2.4 2.3 2.5*  PHOS  --   --  7.9* 8.4*  8.5* 8.1*   GFR: Estimated Creatinine Clearance: 13.1 mL/min (A) (by C-G formula based on SCr of 7.13 mg/dL (H)). Recent Labs  Lab 08/15/2020 1248 07/26/2020 1624 07/21/20 0307 07/21/20 2301 07/22/20 0350  PROCALCITON 4.75  --   --   --   --   WBC 5.6  --  2.8* 6.2 6.6  LATICACIDVEN 2.1* 1.3  --   --   --     Liver Function Tests: Recent Labs  Lab 08/15/2020 1248 08/08/2020 1600 07/21/20 0307 07/21/20 2100 07/22/20 0350  AST 113* 108* 83*  --  49*  ALT 46* 48* 40  --  30  ALKPHOS 59 59 57  --  65  BILITOT 0.4 0.3 0.3  --  0.5  PROT 7.7 7.8 6.4*  --  5.7*  ALBUMIN 2.5* 2.5* 2.1* 1.8* 2.1*   No results for input(s): LIPASE, AMYLASE in the last 168 hours. No results for input(s): AMMONIA in the last 168 hours.  ABG    Component Value Date/Time   PHART 7.244 (L) 07/22/2020 0448   PCO2ART 42.1 07/22/2020 0448   PO2ART 139 (H) 07/22/2020 0448   HCO3 18.4 (L) 07/22/2020 0448   ACIDBASEDEF 9.3 (H) 07/22/2020 0448   O2SAT 98.3 07/22/2020 0448     Coagulation Profile: Recent Labs  Lab 07/21/20 2301  INR 1.3*    Cardiac Enzymes: No results for input(s): CKTOTAL, CKMB, CKMBINDEX, TROPONINI in the last 168 hours.  HbA1C: No results found for: HGBA1C  CBG: Recent Labs  Lab 07/21/20 1132 07/21/20 1841 07/21/20 1926 07/22/20 0053 07/22/20 0313  GLUCAP 165* 172* 206* 210* 224*     Critical care time: 40 minutes    [name], MD [place] [facility] Pager: [phone] Cell: [phone] If no response, call [phone]

## 2020-07-22 NOTE — Progress Notes (Addendum)
Initial Nutrition Assessment  DOCUMENTATION CODES:   Obesity unspecified  INTERVENTION:  - will adjust TF regimen: Vital High Protein @ 45 ml/hr with 90 ml Prosource TF x5/day (10 packets/day). - this regimen + kcal from current propofol rate will provide 2351 kcal (107% estimated kcal need), 204 grams protein, 1724 mg Phos, and 892 ml free water.  - will order 1 tablet rena-vit/day. - free water flush per CCM.   NUTRITION DIAGNOSIS:   Increased nutrient needs related to acute illness, catabolic illness (FIEPP-29 infection) as evidenced by estimated needs  GOAL:   Provide needs based on ASPEN/SCCM guidelines  MONITOR:   Vent status, TF tolerance, Labs, Weight trends  REASON FOR ASSESSMENT:   Ventilator, Consult Enteral/tube feeding initiation and management  ASSESSMENT:   64 year old male who quit smoking 2 weeks PTA. He presented to the ED due to 3 day hx of fever, feeling unwell, generalized weakness, and SOB. He family brought him in due to poor responsiveness with O2 of 45% in the ED. CXR showed evidence of bilateral interstitial and alveolar infiltrates. Patient is unvaccinated against COVID-19 and was diagnosed with COVID-19 this admission.  Patient was intubated yesterday at ~1645 and OGT placed (tip in proximal stomach). Patient is currently receiving TF per protocol: Vital High Protein @ 40 ml/hr with 45 ml Prosource TF BID. This regimen provides 1040 kcal, 106 grams protein, and 802 ml free water.   Able to talk with RN. CRRT started overnight 8/2-8/3 after HD cath placed 8/2. Patient remains on CRRT. Magnesium is trending slightly up from 8/2 at 2100 to 0350 today. Phosphorus trending slightly down during this same time frame.   Weight yesterday was 242 lb and weight today is 248 lb. Patient was on CLD on 8/1 and advanced to Regular yesterday at 0930; no intakes documented during time on PO diet.   Per notes: - persistent clotting of CRRT system despite heparin  infusion - RLE DVT - ARDS d/t COVID-19 PNA - AKI  Patient is currently intubated on ventilator support MV: 14.5 L/min Temp (24hrs), Avg:95.4 F (35.2 C), Min:93.6 F (34.2 C), Max:97.8 F (36.6 C) Propofol: 33 ml/hr (871 kcal)  Labs reviewed; CBGs: 210, 224, 279 mg/dl, BUN: 113 mg/dl, creatinine: 7.13 mg/dl, Ca: 7.1 mg/dl, Mg: 2.5 mg/dl, Phos: 8.1 mg/dl. Medications reviewed; 100 mg colace BID, 500 mg ascorbic acid/day, sliding scale novolog, 5 units novolog every 4 hours, 5 units levemir BID, 80 mg solu-medrol TID, 17 g miralax/day, 100 mg IV remdesivir x1 dose/day (8/2-8/5), 220 mg zinc sulfate/day.  IVF; D5-150 mEq IV sodium bicarb @ 125 ml/hr (510 kcal).  Drips; propofol @ 50 mcg/kg/min, levo @ 6 mcg/min, heparin @ 1500 units/hr, fentanyl @ 200 mcg/hr.    NUTRITION - FOCUSED PHYSICAL EXAM:  completed; no muscle or fat wasting, mild edema to BLE.   Diet Order:   Diet Order            Diet regular Room service appropriate? Yes; Fluid consistency: Thin  Diet effective now                 EDUCATION NEEDS:   No education needs have been identified at this time  Skin:  Skin Assessment: Reviewed RN Assessment  Last BM:  8/2  Height:   Ht Readings from Last 1 Encounters:  07/21/20 5\' 9"  (1.753 m)    Weight:   Wt Readings from Last 1 Encounters:  07/22/20 112.5 kg     Estimated Nutritional Needs:  Kcal:  2188 kcal [facility] 2 Equation) Protein:  >/= 202 grams (1.8 grams/kg actual weight) Fluid:  >/= 2.2 L/day     Jarome Matin, MS, RD, LDN, CNSC Inpatient Clinical Dietitian RD pager # available in [facility]  After hours/weekend pager # available in [facility]

## 2020-07-22 NOTE — Progress Notes (Signed)
eLink Physician-Brief Progress Note Patient Name: Alex [name] DOB: [birth year] MRN: [id number]   Date of Service  07/22/2020  HPI/Events of Note  Temp 34.5. Request for bair hugger.  eICU Interventions  Order entered.     Intervention Category Minor Interventions: Routine modifications to care plan (e.g. PRN medications for pain, fever)  Marily Lente Geena Weinhold 07/22/2020, 2:54 AM

## 2020-07-22 NOTE — Procedures (Signed)
Admit: 08/13/2020 LOS: 2  Currently intubated and sedated  Current CRRT Prescription: Start Date: 07/21/20 Catheter: LIJ trialysis catheter placed 07/21/20 BFR: 300 Pre Blood Pump: 500 DFR: 2500 Replacement Rate: 300 Goal UF: CVP of < 4 mm Hg Anticoagulation: heparin Clotting: multiple systems, now on systemic heparin.    S: Intubated and sedated.   O: 08/02 0701 - 08/03 0700 In: 4169.7 [I.V.:3532.2; NG/GT:440; IV Piggyback:197.6] Out: 1059 [Urine:175]  Filed Weights   07/28/2020 1849 07/21/20 0500 07/22/20 0324  Weight: (!) 108.9 kg (!) 109.9 kg 112.5 kg    Recent Labs  Lab 07/21/20 0307 07/21/20 2100 07/22/20 0350  NA 136 138 136  K 5.8* 5.6* 4.9  CL 101 102 100  CO2 19* 15* 20*  GLUCOSE 260* 213* 276*  BUN 120* 105* 113*  CREATININE 8.43* 8.01* 7.13*  CALCIUM 7.4* 7.6* 7.1*  PHOS 7.9* 8.4*  8.5* 8.1*   Recent Labs  Lab 07/22/2020 1248 08/19/2020 1248 07/21/20 0307 07/21/20 2301 07/22/20 0350  WBC 5.6   < > 2.8* 6.2 6.6  NEUTROABS 4.2  --  2.1  --  5.8  HGB 17.1*   < > 13.8 13.1 13.8  HCT 51.2   < > 42.5 39.8 42.8  MCV 83.8   < > 87.1 86.7 87.0  PLT 226   < > 185 212 242   < > = values in this interval not displayed.    Scheduled Meds: . vitamin C  500 mg Oral Daily  . chlorhexidine gluconate (MEDLINE KIT)  15 mL Mouth Rinse BID  . Chlorhexidine Gluconate Cloth  6 each Topical Daily  . docusate  100 mg Per Tube BID  . feeding supplement (PROSource TF)  45 mL Per Tube BID  . feeding supplement (VITAL HIGH PROTEIN)  1,000 mL Per Tube Q24H  . insulin aspart  0-15 Units Subcutaneous Q4H  . insulin aspart  5 Units Subcutaneous Q4H  . insulin detemir  5 Units Subcutaneous BID  . mouth rinse  15 mL Mouth Rinse BID  . mouth rinse  15 mL Mouth Rinse 10 times per day  . methylPREDNISolone (SOLU-MEDROL) injection  80 mg Intravenous Q8H  . polyethylene glycol  17 g Per Tube Daily  . sodium chloride flush  3 mL Intravenous Q12H  . zinc sulfate  220 mg Oral Daily    Continuous Infusions: . cefTRIAXone (ROCEPHIN)  IV Stopped (07/22/20 0125)  . fentaNYL infusion INTRAVENOUS 200 mcg/hr (07/22/20 1100)  . heparin 10,000 units/ 20 mL infusion syringe 250 Units/hr (07/22/20 0135)  . heparin 1,500 Units/hr (07/22/20 1100)  . norepinephrine (LEVOPHED) Adult infusion 6 mcg/min (07/22/20 1100)  . prismasol BGK 2/2.5 replacement solution 500 mL/hr at 07/22/20 0137  . prismasol BGK 2/2.5 replacement solution 300 mL/hr at 07/22/20 0137  . prismasol BGK 4/2.5 2,500 mL/hr at 07/22/20 0413  . propofol (DIPRIVAN) infusion 50 mcg/kg/min (07/22/20 1104)  . remdesivir 100 mg in NS 100 mL 200 mL/hr at 07/22/20 1100  .  sodium bicarbonate  infusion 1000 mL    . sodium bicarbonate 150 mEq in dextrose 5% 1000 mL 125 mL/hr at 07/22/20 1100  . [START ON 07/24/2020] sodium bicarbonate 150 mEq in dextrose 5% 1000 mL     PRN Meds:.acetaminophen, fentaNYL, heparin, heparin, midazolam, midazolam, ondansetron **OR** ondansetron (ZOFRAN) IV, sodium chloride  ABG    Component Value Date/Time   PHART 7.244 (L) 07/22/2020 0448   PCO2ART 42.1 07/22/2020 0448   PO2ART 139 (H) 07/22/2020 0448   HCO3  18.4 (L) 07/22/2020 0448   ACIDBASEDEF 9.3 (H) 07/22/2020 0448   O2SAT 98.3 07/22/2020 0448    A/P  1. 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  Started on CRRT due to oliguria, hyperkalemia, and worsening acidemia on 07/21/20.    Donetta Potts, MD [place] Pager [phone] Office [phone]

## 2020-07-22 NOTE — Progress Notes (Addendum)
ANTICOAGULATION CONSULT NOTE - Initial Consult  Pharmacy Consult for IV heparin Indication: acute DVT  No Known Allergies  Patient Measurements: Height: 5' 9"  (175.3 cm) Weight: 112.5 kg (248 lb) IBW/kg (Calculated) : 70.7 Heparin Dosing Weight: 95 kg  Vital Signs: Temp: 95 F (35 C) (08/03 0700) Temp Source: Axillary (08/02 2300) BP: 110/72 (08/03 0700) Pulse Rate: 67 (08/03 0700)  Labs: Recent Labs    08/15/2020 1248 08/17/2020 1600 08/15/2020 1624 07/21/20 0006 07/21/20 0307 07/21/20 0307 07/21/20 2100 07/21/20 2301 07/22/20 0350  HGB 17.1*   < >  --   --  13.8   < >  --  13.1 13.8  HCT 51.2   < >  --   --  42.5  --   --  39.8 42.8  PLT 226   < >  --   --  185  --   --  212 242  APTT  --   --   --   --   --   --   --  31  --   LABPROT  --   --   --   --   --   --   --  15.2  --   INR  --   --   --   --   --   --   --  1.3*  --   CREATININE 7.90*   < >  --    < > 8.43*  --  8.01*  --  7.13*  TROPONINIHS 23*  --  21*  --   --   --   --   --   --    < > = values in this interval not displayed.    Estimated Creatinine Clearance: 13.1 mL/min (A) (by C-G formula based on SCr of 7.13 mg/dL (H)).   Medical History: No past medical history on file.  Medications:  Scheduled:  . vitamin C  500 mg Oral Daily  . chlorhexidine gluconate (MEDLINE KIT)  15 mL Mouth Rinse BID  . Chlorhexidine Gluconate Cloth  6 each Topical Daily  . docusate  100 mg Per Tube BID  . feeding supplement (PROSource TF)  45 mL Per Tube BID  . feeding supplement (VITAL HIGH PROTEIN)  1,000 mL Per Tube Q24H  . insulin aspart  0-15 Units Subcutaneous Q4H  . insulin aspart  5 Units Subcutaneous Q4H  . insulin detemir  5 Units Subcutaneous BID  . mouth rinse  15 mL Mouth Rinse BID  . mouth rinse  15 mL Mouth Rinse 10 times per day  . methylPREDNISolone (SOLU-MEDROL) injection  80 mg Intravenous Q8H  . polyethylene glycol  17 g Per Tube Daily  . sodium chloride flush  3 mL Intravenous Q12H  . zinc  sulfate  220 mg Oral Daily   Infusions:  . cefTRIAXone (ROCEPHIN)  IV Stopped (07/22/20 0125)  . fentaNYL infusion INTRAVENOUS 200 mcg/hr (07/22/20 0900)  . heparin 10,000 units/ 20 mL infusion syringe 250 Units/hr (07/22/20 0135)  . norepinephrine (LEVOPHED) Adult infusion 6 mcg/min (07/22/20 0900)  . prismasol BGK 2/2.5 replacement solution 500 mL/hr at 07/22/20 0137  . prismasol BGK 2/2.5 replacement solution 300 mL/hr at 07/22/20 0137  . prismasol BGK 4/2.5 2,500 mL/hr at 07/22/20 0413  . propofol (DIPRIVAN) infusion 50 mcg/kg/min (07/22/20 0900)  . remdesivir 100 mg in NS 100 mL Stopped (07/21/20 1021)  . sodium bicarbonate 150 mEq in dextrose 5% 1000 mL 125 mL/hr at 07/22/20 0900  Assessment: 64 yo male with COVID PNA, now on CRRT, D-dimer rose from 7 to 17 to start IV heparin per Rx dosing for possible VTE. Note patient had 5000 units SQ heparin at 0600 this AM. CBC stable.   Goal of Therapy:  Heparin level 0.3-0.7 units/ml Monitor platelets by anticoagulation protocol: Yes   Plan:   NO heparin bolus due to SQ 5000 unit being given at 0600 this AM  Start IV heparin at rate of 1500 units/hr  Check heparin level 8 hours after start of IV heparin  Daily CBC and heparin level  Kara Mead 07/22/2020,9:16 AM

## 2020-07-22 NOTE — Progress Notes (Signed)
eLink Physician-Brief Progress Note Patient Name: Alex [name] DOB: 11/28/1956 MRN: [id number]   Date of Service  07/22/2020  HPI/Events of Note  ABG 7.24/42/139/18.4/BE -9.3.  Improved from earlier ABG (prior to starting CRRT) of 7.15/43.  Vent Mode: PRVC FiO2 (%):  [10 %-100 %] 100 % Set Rate:  [35 bmp] 35 bmp Vt Set:  [420 mL] 420 mL PEEP:  [12 cmH20] 12 cmH20 Plateau Pressure:  [29 cmH20-31 cmH20] 30 cmH20   eICU Interventions  Continue current management. Acidosis resolving on CRRT. Ventilator settings optimized.     Intervention Category Major Interventions: Acid-Base disturbance - evaluation and management  Marily Lente Everlena Mackley 07/22/2020, 5:29 AM

## 2020-07-22 NOTE — Procedures (Signed)
Central Venous Catheter Insertion Procedure Note  Alex [name]  [id number]  09-25-1956  Date:07/22/20  Time:12:33 AM   Provider Performing:Cyril Railey L Svetlana Bagby   Procedure: Insertion of Non-tunneled Central Venous Catheter(36556)with US guidance (48347)    Indication(s) Hemodialysis  Consent Unable to obtain consent due to inability to find a medical decision maker for patient.  All reasonable efforts were made.  Another independent medical provider, Dr. Hollie Salk , confirmed the benefits of this procedure outweigh the risks.  Anesthesia Topical only with 1% lidocaine   Timeout Verified patient identification, verified procedure, site/side was marked, verified correct patient position, special equipment/implants available, medications/allergies/relevant history reviewed, required imaging and test results available.  Sterile Technique Maximal sterile technique including full sterile barrier drape, hand hygiene, sterile gown, sterile gloves, mask, hair covering, sterile ultrasound probe cover (if used).  Procedure Description Area of catheter insertion was cleaned with chlorhexidine and draped in sterile fashion.   With real-time ultrasound guidance a HD catheter was placed into the right internal jugular vein.  Nonpulsatile blood flow and easy flushing noted in all ports.  The catheter was sutured in place and sterile dressing applied.  Complications/Tolerance None; patient tolerated the procedure well. Chest X-ray is ordered to verify placement for internal jugular or subclavian cannulation.  Chest x-ray is not ordered for femoral cannulation.  EBL Minimal  Specimen(s) None

## 2020-07-22 NOTE — TOC Progression Note (Signed)
Transition of Care [facility]) - Progression Note    Patient Details  Name: Alex [name] MRN: [id number] Date of Birth: Sep 09, 1956  [facility] of [facility]) CM/SW Contact  Leeroy Cha, RN Phone Number: 07/22/2020, 7:39 AM  Clinical Narrative:    Patient intubated on [id number]   Expected Discharge Plan: Home/Self Care Barriers to Discharge: Continued Medical Work up  Expected Discharge Plan and Services Expected Discharge Plan: Home/Self Care       Living arrangements for the past 2 months: Single Family Home                                       Social Determinants of Health (SDOH) Interventions    Readmission Risk Interventions No flowsheet data found.

## 2020-07-22 NOTE — Progress Notes (Signed)
[facility] PCCM  Bradycardia event: tele review sinus Did not become hypotensive Given atropine, improved  Stop propofol Start versed Check ABG Atropine at bedside  [name], MD [place] P[facility] Pager: [phone] Cell: [phone] If no response, call [phone]

## 2020-07-22 NOTE — Progress Notes (Signed)
Patient ID: Alex [name], male   DOB: 07-Apr-1956, 64 y.o.   MRN: [id number] S: Pt decompensated last night and was intubated due to progressive respiratory failure and worsening acidosis.  CRRT was initiated last night, however he continued to clot the system despite heparin infusion.  He was also diagnosed with an acute RLE DVT and is starting systemic heparin.   O:BP 105/66   Pulse 66   Temp (!) 96.6 F (35.9 C)   Resp (!) 31   Ht 5' 9"  (1.753 m)   Wt 112.5 kg   SpO2 99%   BMI 36.62 kg/m   Intake/Output Summary (Last 24 hours) at 07/22/2020 1156 Last data filed at 07/22/2020 1100 Gross per 24 hour  Intake 4402.37 ml  Output 1449 ml  Net 2953.37 ml   Intake/Output: I/O last 3 completed shifts: In: 5242.4 [I.V.:4505; NG/GT:440; IV Piggyback:297.4] Out: 1059 [Urine:175; Other:884]  Intake/Output this shift:  Total I/O In: 989.1 [I.V.:768.2; Other:10; NG/GT:205; IV Piggyback:5.9] Out: 390 [Urine:34; Other:356] Weight change: 3.629 kg Gen: intubated and sedated Physical exam: unable to complete due to COVID + status.  In order to preserve PPE equipment and to minimize exposure to providers.  Notes from other caregivers reviewed   Recent Labs  Lab 07/21/2020 1248 08/15/2020 1600 07/21/20 0006 07/21/20 0307 07/21/20 2100 07/22/20 0350  NA 133* 135 135 136 138 136  K 5.0 5.2* 5.4* 5.8* 5.6* 4.9  CL 97* 96* 102 101 102 100  CO2 16* 17* 18* 19* 15* 20*  GLUCOSE 184* 182* 247* 260* 213* 276*  BUN 101* 99* 86* 120* 105* 113*  CREATININE 7.90* 8.12* 8.52* 8.43* 8.01* 7.13*  ALBUMIN 2.5* 2.5*  --  2.1* 1.8* 2.1*  CALCIUM 8.3* 8.5* 7.5* 7.4* 7.6* 7.1*  PHOS  --   --   --  7.9* 8.4*  8.5* 8.1*  AST 113* 108*  --  83*  --  49*  ALT 46* 48*  --  40  --  30   Liver Function Tests: Recent Labs  Lab 08/03/2020 1600 07/27/2020 1600 07/21/20 0307 07/21/20 2100 07/22/20 0350  AST 108*  --  83*  --  49*  ALT 48*  --  40  --  30  ALKPHOS 59  --  57  --  65  BILITOT 0.3  --  0.3  --  0.5   PROT 7.8  --  6.4*  --  5.7*  ALBUMIN 2.5*   < > 2.1* 1.8* 2.1*   < > = values in this interval not displayed.   No results for input(s): LIPASE, AMYLASE in the last 168 hours. No results for input(s): AMMONIA in the last 168 hours. CBC: Recent Labs  Lab 08/11/2020 1248 07/26/2020 1248 07/21/20 0307 07/21/20 2301 07/22/20 0350  WBC 5.6   < > 2.8* 6.2 6.6  NEUTROABS 4.2  --  2.1  --  5.8  HGB 17.1*   < > 13.8 13.1 13.8  HCT 51.2   < > 42.5 39.8 42.8  MCV 83.8  --  87.1 86.7 87.0  PLT 226   < > 185 212 242   < > = values in this interval not displayed.   Cardiac Enzymes: No results for input(s): CKTOTAL, CKMB, CKMBINDEX, TROPONINI in the last 168 hours. CBG: Recent Labs  Lab 07/21/20 1926 07/22/20 0053 07/22/20 0313 07/22/20 0750 07/22/20 1116  GLUCAP 206* 210*  210* 224*  224* 279*  279* 264*    Iron Studies:  Recent Labs  07/22/20 0350  FERRITIN 1,756*   Studies/Results: DG Abd 1 View  Result Date: 07/21/2020 CLINICAL DATA:  64 year old male with central line placement. EXAM: ABDOMEN - 1 VIEW; PORTABLE CHEST - 1 VIEW COMPARISON:  Chest radiograph dated 07/25/2020. FINDINGS: An endotracheal tube is noted with tip approximately 4 cm above the carina. Enteric tube extends below the diaphragm with tip and side-port in the proximal stomach. Left IJ central venous line with tip at the junction of the SVC/innominate vein. Interval worsening of bilateral airspace opacities compared to the prior radiograph. Small pleural effusions may be present. No pneumothorax. There is mild cardiomegaly. No acute osseous pathology. IMPRESSION: 1. Interval worsening of bilateral airspace opacities compared to the prior radiograph. 2. Support apparatus as described. 3. No pneumothorax. Electronically Signed   By: Anner Crete M.D.   On: 07/21/2020 18:22   US RENAL  Result Date: 07/21/2020 CLINICAL DATA:  Acute kidney injury EXAM: RENAL / URINARY TRACT ULTRASOUND COMPLETE COMPARISON:   None. FINDINGS: Right Kidney: Renal measurements: 13.9 x 6.4 x 6.6 cm = volume: 308 mL. There is increased cortical echogenicity without evidence for hydronephrosis. Left Kidney: Renal measurements: 13.1 x 6.5 x 6.5 cm = volume: 288 mL. Increased cortical echogenicity is noted. There is no hydronephrosis. Bladder: Underdistended by Foley catheter Other: None. IMPRESSION: 1. No hydronephrosis. 2. Echogenic kidneys bilaterally which can be seen in patients with medical renal disease. 3. Bladder poorly evaluated secondary to underdistention by Foley catheter. Electronically Signed   By: Constance Holster M.D.   On: 07/21/2020 21:22   DG CHEST PORT 1 VIEW  Result Date: 07/22/2020 CLINICAL DATA:  Hemodialysis catheter placement EXAM: PORTABLE CHEST 1 VIEW COMPARISON:  None. FINDINGS: Tip of the right IJ approach hemodialysis catheter is in the lower SVC. Unchanged appearance of the tip the left IJ approach central venous catheter. There is moderate interstitial pulmonary edema. Cardiomegaly is unchanged. IMPRESSION: Tip of right IJ approach hemodialysis catheter in the lower SVC. Electronically Signed   By: Ulyses Jarred M.D.   On: 07/22/2020 00:30   DG CHEST PORT 1 VIEW  Result Date: 07/21/2020 CLINICAL DATA:  64 year old male with central line placement. EXAM: ABDOMEN - 1 VIEW; PORTABLE CHEST - 1 VIEW COMPARISON:  Chest radiograph dated 07/25/2020. FINDINGS: An endotracheal tube is noted with tip approximately 4 cm above the carina. Enteric tube extends below the diaphragm with tip and side-port in the proximal stomach. Left IJ central venous line with tip at the junction of the SVC/innominate vein. Interval worsening of bilateral airspace opacities compared to the prior radiograph. Small pleural effusions may be present. No pneumothorax. There is mild cardiomegaly. No acute osseous pathology. IMPRESSION: 1. Interval worsening of bilateral airspace opacities compared to the prior radiograph. 2. Support apparatus  as described. 3. No pneumothorax. Electronically Signed   By: Anner Crete M.D.   On: 07/21/2020 18:22   DG Chest Port 1 View  Result Date: 08/14/2020 CLINICAL DATA:  SOB, concern for Covid, Tachypnea. EXAM: PORTABLE CHEST 1 VIEW COMPARISON:  None. FINDINGS: Cardiomegaly. Prominent interstitial markings suggesting vascular congestion/interstitial edema. More confluent airspace opacities at the lung bases. No pleural effusion or pneumothorax is seen. Osseous structures about the chest are unremarkable. IMPRESSION: 1. Multifocal pneumonia versus pulmonary edema. 2. Cardiomegaly. Electronically Signed   By: Franki Cabot M.D.   On: 08/18/2020 13:45   VAS [place] LOWER EXTREMITY VENOUS (DVT)  Result Date: 07/21/2020  Lower Venous DVTStudy Indications: Swelling.  Risk Factors: COVID 19 positive. Limitations: Patient  positioning. Comparison Study: No prior studies. Performing Technologist: Oliver Hum RVT  Examination Guidelines: A complete evaluation includes B-mode imaging, spectral Doppler, color Doppler, and power Doppler as needed of all accessible portions of each vessel. Bilateral testing is considered an integral part of a complete examination. Limited examinations for reoccurring indications may be performed as noted. The reflux portion of the exam is performed with the patient in reverse Trendelenburg.  +---------+---------------+---------+-----------+----------+--------------+ RIGHT    CompressibilityPhasicitySpontaneityPropertiesThrombus Aging +---------+---------------+---------+-----------+----------+--------------+ CFV      Full           Yes      Yes                                 +---------+---------------+---------+-----------+----------+--------------+ SFJ      Full                                                        +---------+---------------+---------+-----------+----------+--------------+ FV Prox  Full                                                         +---------+---------------+---------+-----------+----------+--------------+ FV Mid   Full                                                        +---------+---------------+---------+-----------+----------+--------------+ FV DistalFull                                                        +---------+---------------+---------+-----------+----------+--------------+ PFV      Full                                                        +---------+---------------+---------+-----------+----------+--------------+ POP      Full           Yes      Yes                                 +---------+---------------+---------+-----------+----------+--------------+ PTV      Full                                                        +---------+---------------+---------+-----------+----------+--------------+ PERO     None  Acute          +---------+---------------+---------+-----------+----------+--------------+   +---------+---------------+---------+-----------+----------+--------------+ LEFT     CompressibilityPhasicitySpontaneityPropertiesThrombus Aging +---------+---------------+---------+-----------+----------+--------------+ CFV      Full           Yes      Yes                                 +---------+---------------+---------+-----------+----------+--------------+ SFJ      Full                                                        +---------+---------------+---------+-----------+----------+--------------+ FV Prox  Full                                                        +---------+---------------+---------+-----------+----------+--------------+ FV Mid   Full                                                        +---------+---------------+---------+-----------+----------+--------------+ FV DistalFull                                                         +---------+---------------+---------+-----------+----------+--------------+ PFV      Full                                                        +---------+---------------+---------+-----------+----------+--------------+ POP      Full           Yes      Yes                                 +---------+---------------+---------+-----------+----------+--------------+ PTV      Full                                                        +---------+---------------+---------+-----------+----------+--------------+ PERO     Full                                                        +---------+---------------+---------+-----------+----------+--------------+     Summary: RIGHT: - Findings consistent with acute deep vein thrombosis involving the right peroneal veins. - No cystic structure found in the popliteal fossa.  LEFT: - There is  no evidence of deep vein thrombosis in the lower extremity.  - No cystic structure found in the popliteal fossa.  *See table(s) above for measurements and observations. Electronically signed by Ruta Hinds MD on 07/21/2020 at 3:40:36 PM.    Final    . vitamin C  500 mg Oral Daily  . chlorhexidine gluconate (MEDLINE KIT)  15 mL Mouth Rinse BID  . Chlorhexidine Gluconate Cloth  6 each Topical Daily  . docusate  100 mg Per Tube BID  . feeding supplement (PROSource TF)  45 mL Per Tube BID  . feeding supplement (VITAL HIGH PROTEIN)  1,000 mL Per Tube Q24H  . insulin aspart  0-15 Units Subcutaneous Q4H  . insulin aspart  5 Units Subcutaneous Q4H  . insulin detemir  5 Units Subcutaneous BID  . mouth rinse  15 mL Mouth Rinse BID  . mouth rinse  15 mL Mouth Rinse 10 times per day  . methylPREDNISolone (SOLU-MEDROL) injection  80 mg Intravenous Q8H  . polyethylene glycol  17 g Per Tube Daily  . sodium chloride flush  3 mL Intravenous Q12H  . zinc sulfate  220 mg Oral Daily    BMET    Component Value Date/Time   NA 136 07/22/2020 0350   K 4.9 07/22/2020  0350   CL 100 07/22/2020 0350   CO2 20 (L) 07/22/2020 0350   GLUCOSE 276 (H) 07/22/2020 0350   BUN 113 (H) 07/22/2020 0350   CREATININE 7.13 (H) 07/22/2020 0350   CALCIUM 7.1 (L) 07/22/2020 0350   GFRNONAA 7 (L) 07/22/2020 0350   GFRAA 9 (L) 07/22/2020 0350   CBC    Component Value Date/Time   WBC 6.6 07/22/2020 0350   RBC 4.92 07/22/2020 0350   HGB 13.8 07/22/2020 0350   HCT 42.8 07/22/2020 0350   PLT 242 07/22/2020 0350   MCV 87.0 07/22/2020 0350   MCH 28.0 07/22/2020 0350   MCHC 32.2 07/22/2020 0350   RDW 13.5 07/22/2020 0350   LYMPHSABS 0.5 (L) 07/22/2020 0350   MONOABS 0.3 07/22/2020 0350   EOSABS 0.0 07/22/2020 0350   BASOSABS 0.0 07/22/2020 0350    Assessment/Plan: 1.  Oliguric AKI vs progressive CKD - in setting of covid pneumonia and concomitant ARB use.  Unclear what stage of CKD existed prior to this presentation as there is no baseline Scr known.  He has poor medical literacy and insight.  I discussed the severity of his kidney disease and the potential need for dialysis, however he did not grasp the seriousness of his condition and just wanted to leave.   1. HD catheter placed 07/21/20 and initiated CRRT  2. Continue with CRRT but will try larger filter to help prevent clotting. 3. Systemic anticoagulation as well as intra-circuit heparin. 4. Renal US consistent with chronic medical renal disease.  5. Off of metformin and losartan 6. Goal CVP of <4 mmHg per PCCM 7. May need to change filter size if he becomes hypotensive 2. Acute hypoxic respiratory failure with ARDS due to covid pneumonia.  s/p intubation 07/21/20. 3. Covid pneumonia- unvaccinated smoker.  He has been treated with decadron, rocephin, remdesivir per PCCM 4. Acute right leg DVT- on systemic heparin infusion. 5. Hyperkalemia- due to #1.  Improved with CRRT. 6. Metabolic acidosis- due to #1.  Improving. 7. Proteinuria- likely due to underlying diabetic nephropathy, however we have seen worsening of  proteinuria with covid-19 infection. 8. Disposition- unfortunately was not able to speak with Palliative care before he was intubated.  If  he continues to worsen, would recommend palliative care consult to help set goals/limits of care. Donetta Potts, MD [employer] [phone]

## 2020-07-22 NOTE — Progress Notes (Signed)
eLink Physician-Brief Progress Note Patient Name: MARCELLES CLINARD DOB: [birth year] MRN: [id number]   Date of Service  07/22/2020  HPI/Events of Note  BP 87/60. RN is about to start CRRT.  eICU Interventions  Ordered levophed drip to improve BP prior to putting on CRRT circuit.      Intervention Category Major Interventions: Hypotension - evaluation and management  Marily Lente Avry Roedl 07/22/2020, 1:02 AM

## 2020-07-22 NOTE — Progress Notes (Signed)
ANTICOAGULATION CONSULT NOTE - Follow Up Consult  Pharmacy Consult for Heparin Indication: acute DVT  No Known Allergies  Patient Measurements: Height: 5' 9"  (175.3 cm) Weight: 112.5 kg (248 lb) IBW/kg (Calculated) : 70.7 Heparin Dosing Weight: 95 kg  Vital Signs: Temp: 97 F (36.1 C) (08/03 1845) BP: 106/65 (08/03 1845) Pulse Rate: 58 (08/03 1845)  Labs: Recent Labs    07/26/2020 1248 07/25/2020 1600 08/08/2020 1624 07/21/20 0006 07/21/20 0307 07/21/20 0307 07/21/20 2100 07/21/20 2301 07/22/20 0350 07/22/20 1800  HGB 17.1*   < >  --   --  13.8   < >  --  13.1 13.8  --   HCT 51.2   < >  --   --  42.5  --   --  39.8 42.8  --   PLT 226   < >  --   --  185  --   --  212 242  --   APTT  --   --   --   --   --   --   --  31  --   --   LABPROT  --   --   --   --   --   --   --  15.2  --   --   INR  --   --   --   --   --   --   --  1.3*  --   --   HEPARINUNFRC  --   --   --   --   --   --   --   --   --  0.66  CREATININE 7.90*   < >  --    < > 8.43*  --  8.01*  --  7.13*  --   TROPONINIHS 23*  --  21*  --   --   --   --   --   --   --    < > = values in this interval not displayed.    Estimated Creatinine Clearance: 13.1 mL/min (A) (by C-G formula based on SCr of 7.13 mg/dL (H)).   Medications:  Scheduled:  . vitamin C  500 mg Oral Daily  . chlorhexidine gluconate (MEDLINE KIT)  15 mL Mouth Rinse BID  . Chlorhexidine Gluconate Cloth  6 each Topical Daily  . docusate  100 mg Per Tube BID  . EPINEPHrine      . feeding supplement (PROSource TF)  90 mL Per Tube 5 X Daily  . insulin aspart  0-15 Units Subcutaneous Q4H  . insulin aspart  5 Units Subcutaneous Q4H  . insulin detemir  5 Units Subcutaneous BID  . mouth rinse  15 mL Mouth Rinse BID  . mouth rinse  15 mL Mouth Rinse 10 times per day  . methylPREDNISolone (SOLU-MEDROL) injection  80 mg Intravenous Q8H  . multivitamin  1 tablet Per Tube QHS  . polyethylene glycol  17 g Per Tube Daily  . sodium chloride flush  3  mL Intravenous Q12H  . zinc sulfate  220 mg Oral Daily   Infusions:  . cefTRIAXone (ROCEPHIN)  IV Stopped (07/22/20 0125)  . feeding supplement (VITAL HIGH PROTEIN) 1,000 mL (07/22/20 1245)  . fentaNYL infusion INTRAVENOUS 275 mcg/hr (07/22/20 1900)  . heparin 10,000 units/ 20 mL infusion syringe 250 Units/hr (07/22/20 0135)  . heparin 1,500 Units/hr (07/22/20 1900)  . midazolam 5.5 mg/hr (07/22/20 1900)  . norepinephrine (LEVOPHED) Adult infusion 7 mcg/min (07/22/20  1900)  . prismasol BGK 2/2.5 replacement solution 500 mL/hr at 07/22/20 1604  . prismasol BGK 2/2.5 replacement solution 300 mL/hr at 07/22/20 0137  . prismasol BGK 4/2.5 2,500 mL/hr at 07/22/20 1614  . remdesivir 100 mg in NS 100 mL Stopped (07/22/20 1128)  .  sodium bicarbonate  infusion 1000 mL 125 mL/hr at 07/22/20 1900  . [START ON 07/24/2020] sodium bicarbonate 150 mEq in dextrose 5% 1000 mL     PRN: acetaminophen, fentaNYL, heparin, heparin, midazolam, midazolam, ondansetron **OR** ondansetron (ZOFRAN) IV, sodium chloride  Assessment: 64 yo male with COVID PNA, now on CRRT, D-dimer rose from 7 to 17 to start IV heparin per Rx dosing for acute R DVT peroneal vein.   First heparin level 0.66, therapeutic  Note heparin 250 units/hr via CRRT circuit   No bleeding noted per discussion with RN  Goal of Therapy:  Heparin level 0.3-0.7 units/ml Monitor platelets by anticoagulation protocol: Yes   Plan:   Continue heparin 1500 units/hr  Recheck heparin level in 8hrs  Daily heparin level and CBC  Monitor for signs/symptoms of bleeding  Peggyann Juba, PharmD, BCPS Pharmacy: [phone] 07/22/2020,7:02 PM

## 2020-07-22 NOTE — Progress Notes (Signed)
Spoke with patients brother, Herbie Baltimore, over the phone early this morning. He was returning MD's call regarding consent for HD cath/CRRT. Patients brother is aware of the severity of the situation and brought up a DNR. RN educated him on the different levels of code status and he stated his understanding. Patients mother is 28 and in good health, but is said to be too emotionally impacted to make a sound decision on her own. Herbie Baltimore said he would speak with the family tonight and decide where the family as a whole would like to go from here, taking into account the patients quality of life.

## 2020-07-23 ENCOUNTER — Inpatient Hospital Stay (HOSPITAL_COMMUNITY): Payer: Medicaid Other

## 2020-07-23 DIAGNOSIS — I361 Nonrheumatic tricuspid (valve) insufficiency: Secondary | ICD-10-CM

## 2020-07-23 LAB — RENAL FUNCTION PANEL
Albumin: 2 g/dL — ABNORMAL LOW (ref 3.5–5.0)
Albumin: 2.1 g/dL — ABNORMAL LOW (ref 3.5–5.0)
Anion gap: 10 (ref 5–15)
Anion gap: 13 (ref 5–15)
BUN: 59 mg/dL — ABNORMAL HIGH (ref 8–23)
BUN: 48 mg/dL — ABNORMAL HIGH (ref 8–23)
CO2: 27 mmol/L (ref 22–32)
CO2: 26 mmol/L (ref 22–32)
Calcium: 6.9 mg/dL — ABNORMAL LOW (ref 8.9–10.3)
Calcium: 7 mg/dL — ABNORMAL LOW (ref 8.9–10.3)
Chloride: 99 mmol/L (ref 98–111)
Chloride: 97 mmol/L — ABNORMAL LOW (ref 98–111)
Creatinine, Ser: 3.46 mg/dL — ABNORMAL HIGH (ref 0.61–1.24)
Creatinine, Ser: 3.07 mg/dL — ABNORMAL HIGH (ref 0.61–1.24)
GFR calc Af Amer: 21 mL/min — ABNORMAL LOW (ref >60)
GFR calc Af Amer: 24 mL/min — ABNORMAL LOW (ref >60)
GFR calc non Af Amer: 18 mL/min — ABNORMAL LOW (ref >60)
GFR calc non Af Amer: 21 mL/min — ABNORMAL LOW (ref >60)
Glucose, Bld: 255 mg/dL — ABNORMAL HIGH (ref 70–99)
Glucose, Bld: 275 mg/dL — ABNORMAL HIGH (ref 70–99)
Phosphorus: 4.7 mg/dL — ABNORMAL HIGH (ref 2.5–4.6)
Phosphorus: 4.3 mg/dL (ref 2.5–4.6)
Potassium: 4.2 mmol/L (ref 3.5–5.1)
Potassium: 4.3 mmol/L (ref 3.5–5.1)
Sodium: 136 mmol/L (ref 135–145)
Sodium: 136 mmol/L (ref 135–145)

## 2020-07-23 LAB — HEPARIN LEVEL (UNFRACTIONATED)
Heparin Unfractionated: 0.96 IU/mL — ABNORMAL HIGH (ref 0.30–0.70)
Heparin Unfractionated: 1.1 IU/mL — ABNORMAL HIGH (ref 0.30–0.70)
Heparin Unfractionated: 0.96 IU/mL — ABNORMAL HIGH (ref 0.30–0.70)
Heparin Unfractionated: 0.72 IU/mL — ABNORMAL HIGH (ref 0.30–0.70)

## 2020-07-23 LAB — COMPREHENSIVE METABOLIC PANEL
ALT: 28 U/L (ref 0–44)
AST: 55 U/L — ABNORMAL HIGH (ref 15–41)
Albumin: 2 g/dL — ABNORMAL LOW (ref 3.5–5.0)
Alkaline Phosphatase: 64 U/L (ref 38–126)
Anion gap: 12 (ref 5–15)
BUN: 59 mg/dL — ABNORMAL HIGH (ref 8–23)
CO2: 25 mmol/L (ref 22–32)
Calcium: 6.9 mg/dL — ABNORMAL LOW (ref 8.9–10.3)
Chloride: 100 mmol/L (ref 98–111)
Creatinine, Ser: 3.44 mg/dL — ABNORMAL HIGH (ref 0.61–1.24)
GFR calc Af Amer: 21 mL/min — ABNORMAL LOW (ref >60)
GFR calc non Af Amer: 18 mL/min — ABNORMAL LOW (ref >60)
Glucose, Bld: 256 mg/dL — ABNORMAL HIGH (ref 70–99)
Potassium: 4.3 mmol/L (ref 3.5–5.1)
Sodium: 137 mmol/L (ref 135–145)
Total Bilirubin: 0.4 mg/dL (ref 0.3–1.2)
Total Protein: 5.4 g/dL — ABNORMAL LOW (ref 6.5–8.1)

## 2020-07-23 LAB — CBC WITH DIFFERENTIAL/PLATELET
Abs Immature Granulocytes: 0.07 10*3/uL (ref 0.00–0.07)
Basophils Absolute: 0 10*3/uL (ref 0.0–0.1)
Basophils Relative: 0 %
Eosinophils Absolute: 0 10*3/uL (ref 0.0–0.5)
Eosinophils Relative: 0 %
HCT: 38.9 % — ABNORMAL LOW (ref 39.0–52.0)
Hemoglobin: 12.8 g/dL — ABNORMAL LOW (ref 13.0–17.0)
Immature Granulocytes: 1 %
Lymphocytes Relative: 6 %
Lymphs Abs: 0.6 10*3/uL — ABNORMAL LOW (ref 0.7–4.0)
MCH: 28.5 pg (ref 26.0–34.0)
MCHC: 32.9 g/dL (ref 30.0–36.0)
MCV: 86.6 fL (ref 80.0–100.0)
Monocytes Absolute: 0.6 10*3/uL (ref 0.1–1.0)
Monocytes Relative: 6 %
Neutro Abs: 8.6 10*3/uL — ABNORMAL HIGH (ref 1.7–7.7)
Neutrophils Relative %: 87 %
Platelets: 218 10*3/uL (ref 150–400)
RBC: 4.49 MIL/uL (ref 4.22–5.81)
RDW: 13.6 % (ref 11.5–15.5)
WBC: 9.9 10*3/uL (ref 4.0–10.5)
nRBC: 0 % (ref 0.0–0.2)

## 2020-07-23 LAB — GLUCOSE, CAPILLARY
Glucose-Capillary: 154 mg/dL — ABNORMAL HIGH (ref 70–99)
Glucose-Capillary: 181 mg/dL — ABNORMAL HIGH (ref 70–99)
Glucose-Capillary: 190 mg/dL — ABNORMAL HIGH (ref 70–99)
Glucose-Capillary: 192 mg/dL — ABNORMAL HIGH (ref 70–99)
Glucose-Capillary: 220 mg/dL — ABNORMAL HIGH (ref 70–99)
Glucose-Capillary: 321 mg/dL — ABNORMAL HIGH (ref 70–99)
Glucose-Capillary: 340 mg/dL — ABNORMAL HIGH (ref 70–99)

## 2020-07-23 LAB — PHOSPHORUS: Phosphorus: 4.9 mg/dL — ABNORMAL HIGH (ref 2.5–4.6)

## 2020-07-23 LAB — ECHOCARDIOGRAM COMPLETE
Area-P 1/2: 2.83 cm2
Height: 69 in
S' Lateral: 3.1 cm
Weight: 4000 oz

## 2020-07-23 LAB — D-DIMER, QUANTITATIVE: D-Dimer, Quant: 15.33 ug/mL-FEU — ABNORMAL HIGH (ref 0.00–0.50)

## 2020-07-23 LAB — C-REACTIVE PROTEIN: CRP: 6.7 mg/dL — ABNORMAL HIGH (ref <1.0)

## 2020-07-23 LAB — APTT
aPTT: >200 seconds (ref 24–36)
aPTT: >200 seconds (ref 24–36)

## 2020-07-23 LAB — MAGNESIUM: Magnesium: 2.4 mg/dL (ref 1.7–2.4)

## 2020-07-23 LAB — HEMOGLOBIN A1C
Hgb A1c MFr Bld: 12.3 % — ABNORMAL HIGH (ref 4.8–5.6)
Mean Plasma Glucose: 306.31 mg/dL

## 2020-07-23 LAB — TRIGLYCERIDES: Triglycerides: 135 mg/dL (ref <150)

## 2020-07-23 LAB — FERRITIN: Ferritin: 1201 ng/mL — ABNORMAL HIGH (ref 24–336)

## 2020-07-23 MED ORDER — ATROPINE SULFATE 1 MG/ML IJ SOLN
0.5000 mg | Freq: Once | INTRAMUSCULAR | Status: DC | PRN
  Filled 2020-07-23 (×2): qty 0.5

## 2020-07-23 MED ORDER — PERFLUTREN LIPID MICROSPHERE
1.0000 mL | INTRAVENOUS | Status: AC | PRN
  Administered 2020-07-23: 4 mL via INTRAVENOUS
  Filled 2020-07-23: qty 10

## 2020-07-23 MED ORDER — CLONAZEPAM 0.5 MG PO TABS
1.0000 mg | ORAL_TABLET | Freq: Two times a day (BID) | ORAL | Status: DC
  Administered 2020-07-23: 1 mg
  Administered 2020-07-23: 0.5 mg
  Administered 2020-07-24: 1 mg
  Filled 2020-07-23 (×3): qty 2

## 2020-07-23 MED ORDER — INSULIN ASPART 100 UNIT/ML ~~LOC~~ SOLN
0.0000 [IU] | SUBCUTANEOUS | Status: DC
  Administered 2020-07-23: 4 [IU] via SUBCUTANEOUS
  Administered 2020-07-23: 15 [IU] via SUBCUTANEOUS
  Administered 2020-07-23 – 2020-07-24 (×2): 4 [IU] via SUBCUTANEOUS
  Administered 2020-07-24: 7 [IU] via SUBCUTANEOUS
  Administered 2020-07-24 (×4): 11 [IU] via SUBCUTANEOUS
  Administered 2020-07-25 (×3): 4 [IU] via SUBCUTANEOUS
  Administered 2020-07-25: 7 [IU] via SUBCUTANEOUS
  Administered 2020-07-25 (×3): 4 [IU] via SUBCUTANEOUS
  Administered 2020-07-26 (×2): 11 [IU] via SUBCUTANEOUS
  Administered 2020-07-26: 4 [IU] via SUBCUTANEOUS
  Administered 2020-07-26: 7 [IU] via SUBCUTANEOUS
  Administered 2020-07-26: 11 [IU] via SUBCUTANEOUS
  Administered 2020-07-27: 7 [IU] via SUBCUTANEOUS
  Administered 2020-07-27 (×3): 4 [IU] via SUBCUTANEOUS
  Administered 2020-07-27 (×2): 7 [IU] via SUBCUTANEOUS
  Administered 2020-07-27: 4 [IU] via SUBCUTANEOUS
  Administered 2020-07-28: 11 [IU] via SUBCUTANEOUS
  Administered 2020-07-28 (×3): 4 [IU] via SUBCUTANEOUS
  Administered 2020-07-28: 7 [IU] via SUBCUTANEOUS
  Administered 2020-07-29: 4 [IU] via SUBCUTANEOUS
  Administered 2020-07-29 (×2): 7 [IU] via SUBCUTANEOUS
  Administered 2020-07-29 (×2): 4 [IU] via SUBCUTANEOUS
  Administered 2020-07-29: 7 [IU] via SUBCUTANEOUS
  Administered 2020-07-30: 11 [IU] via SUBCUTANEOUS
  Administered 2020-07-30: 7 [IU] via SUBCUTANEOUS
  Administered 2020-07-30 (×3): 4 [IU] via SUBCUTANEOUS
  Administered 2020-07-30 – 2020-07-31 (×2): 7 [IU] via SUBCUTANEOUS
  Administered 2020-07-31: 4 [IU] via SUBCUTANEOUS
  Administered 2020-07-31: 7 [IU] via SUBCUTANEOUS
  Administered 2020-07-31: 11 [IU] via SUBCUTANEOUS
  Administered 2020-07-31: 7 [IU] via SUBCUTANEOUS
  Administered 2020-07-31 – 2020-08-01 (×2): 4 [IU] via SUBCUTANEOUS
  Administered 2020-08-01 (×3): 7 [IU] via SUBCUTANEOUS
  Administered 2020-08-01 (×2): 11 [IU] via SUBCUTANEOUS
  Administered 2020-08-01: 7 [IU] via SUBCUTANEOUS
  Administered 2020-08-02: 4 [IU] via SUBCUTANEOUS
  Administered 2020-08-02: 3 [IU] via SUBCUTANEOUS
  Administered 2020-08-02 – 2020-08-03 (×5): 4 [IU] via SUBCUTANEOUS
  Administered 2020-08-03 – 2020-08-04 (×4): 3 [IU] via SUBCUTANEOUS
  Administered 2020-08-04 – 2020-08-05 (×3): 4 [IU] via SUBCUTANEOUS
  Administered 2020-08-06 (×2): 3 [IU] via SUBCUTANEOUS
  Administered 2020-08-06: 4 [IU] via SUBCUTANEOUS
  Administered 2020-08-06: 3 [IU] via SUBCUTANEOUS
  Administered 2020-08-07: 4 [IU] via SUBCUTANEOUS
  Administered 2020-08-07: 3 [IU] via SUBCUTANEOUS
  Administered 2020-08-07: 4 [IU] via SUBCUTANEOUS
  Administered 2020-08-07 – 2020-08-08 (×4): 3 [IU] via SUBCUTANEOUS
  Administered 2020-08-08: 4 [IU] via SUBCUTANEOUS
  Administered 2020-08-09: 3 [IU] via SUBCUTANEOUS
  Administered 2020-08-09: 4 [IU] via SUBCUTANEOUS
  Administered 2020-08-09: 3 [IU] via SUBCUTANEOUS
  Administered 2020-08-10: 4 [IU] via SUBCUTANEOUS

## 2020-07-23 MED ORDER — HEPARIN (PORCINE) 25000 UT/250ML-% IV SOLN
950.0000 [IU]/h | INTRAVENOUS | Status: DC
  Administered 2020-07-23: 950 [IU]/h via INTRAVENOUS

## 2020-07-23 MED ORDER — FENTANYL CITRATE (PF) 100 MCG/2ML IJ SOLN: 50.0000 ug | Freq: Once | INTRAMUSCULAR | Status: AC

## 2020-07-23 MED ORDER — VECURONIUM BROMIDE 10 MG IV SOLR
INTRAVENOUS | Status: AC
  Filled 2020-07-23: qty 10

## 2020-07-23 MED ORDER — ATROPINE SULFATE 1 MG/10ML IJ SOSY
1.0000 mg | PREFILLED_SYRINGE | Freq: Once | INTRAMUSCULAR | Status: AC
  Administered 2020-07-23: 1 mg via INTRAVENOUS

## 2020-07-23 MED ORDER — MIDAZOLAM 50MG/50ML (1MG/ML) PREMIX INFUSION: 2.0000 mg/h | INTRAVENOUS | Status: DC

## 2020-07-23 MED ORDER — OXYCODONE HCL 5 MG/5ML PO SOLN
5.0000 mg | Freq: Four times a day (QID) | ORAL | Status: DC
  Administered 2020-07-23 – 2020-07-24 (×5): 5 mg via ORAL
  Filled 2020-07-23 (×5): qty 5

## 2020-07-23 MED ORDER — FENTANYL BOLUS VIA INFUSION
50.0000 ug | INTRAVENOUS | Status: DC | PRN
  Filled 2020-07-23: qty 50

## 2020-07-23 MED ORDER — VECURONIUM BROMIDE 10 MG IV SOLR
0.0800 mg/kg | INTRAVENOUS | Status: DC | PRN
  Administered 2020-07-23 – 2020-07-24 (×4): 9.1 mg via INTRAVENOUS
  Filled 2020-07-23 (×2): qty 10

## 2020-07-23 MED ORDER — CISATRACURIUM BESYLATE (PF) 10 MG/5ML IV SOLN
0.1000 mg/kg | INTRAVENOUS | Status: DC | PRN
  Filled 2020-07-23 (×2): qty 5.7

## 2020-07-23 MED ORDER — ATROPINE SULFATE 1 MG/10ML IJ SOSY
PREFILLED_SYRINGE | INTRAMUSCULAR | Status: AC
  Filled 2020-07-23: qty 10

## 2020-07-23 MED ORDER — FENTANYL 2500MCG IN NS 250ML (10MCG/ML) PREMIX INFUSION
50.0000 ug/h | INTRAVENOUS | Status: DC
  Administered 2020-07-23: 250 ug/h via INTRAVENOUS
  Administered 2020-07-24: 300 ug/h via INTRAVENOUS
  Filled 2020-07-23 (×2): qty 250

## 2020-07-23 MED ORDER — INSULIN ASPART 100 UNIT/ML ~~LOC~~ SOLN
5.0000 [IU] | SUBCUTANEOUS | Status: DC
  Administered 2020-07-23 – 2020-07-24 (×6): 5 [IU] via SUBCUTANEOUS

## 2020-07-23 MED ORDER — ARTIFICIAL TEARS OPHTHALMIC OINT
1.0000 "application " | TOPICAL_OINTMENT | Freq: Three times a day (TID) | OPHTHALMIC | Status: DC
  Administered 2020-07-23 – 2020-07-24 (×4): 1 via OPHTHALMIC
  Filled 2020-07-23: qty 3.5

## 2020-07-23 MED ORDER — ATROPINE SULFATE 1 MG/10ML IJ SOSY
0.5000 mg | PREFILLED_SYRINGE | Freq: Once | INTRAMUSCULAR | Status: AC | PRN
  Administered 2020-07-23: 0.5 mg via INTRAVENOUS

## 2020-07-23 MED ORDER — MIDAZOLAM BOLUS VIA INFUSION
1.0000 mg | INTRAVENOUS | Status: DC | PRN
  Filled 2020-07-23: qty 2

## 2020-07-23 MED ORDER — HEPARIN (PORCINE) 25000 UT/250ML-% IV SOLN
600.0000 [IU]/h | INTRAVENOUS | Status: DC
  Administered 2020-07-23: 700 [IU]/h via INTRAVENOUS
  Administered 2020-07-24: 600 [IU]/h via INTRAVENOUS
  Filled 2020-07-23: qty 250

## 2020-07-23 MED ORDER — VECURONIUM BROMIDE 10 MG IV SOLR
INTRAVENOUS | Status: AC
  Administered 2020-07-23: 10 mg
  Filled 2020-07-23: qty 10

## 2020-07-23 NOTE — Progress Notes (Signed)
ANTICOAGULATION CONSULT NOTE - Follow Up Consult  Pharmacy Consult for Heparin Indication: acute DVT  No Known Allergies  Patient Measurements: Height: _0  (175.3 cm) Weight: 113.4 kg (250 lb) IBW/kg (Calculated) : 70.7 Heparin Dosing Weight: 95 kg  Vital Signs: Temp: 99.3 F (37.4 C) (08/04 1400) Temp Source: Bladder (08/04 0400) BP: 104/63 (08/04 1400) Pulse Rate: 64 (08/04 1400)  Labs: Recent Labs    08/16/2020 1624 07/21/20 0006 07/21/20 2301 07/21/20 2301 07/22/20 0350 07/22/20 1800 07/22/20 2100 07/23/20 0210 07/23/20 0455 07/23/20 1400  HGB  --    < > 13.1   < > 13.8  --   --  12.8*  --   --   HCT  --    < > 39.8  --  42.8  --   --  38.9*  --   --   PLT  --    < > 212  --  242  --   --  218  --   --   APTT  --   --  31  --   --   --   --  >200* >200*  --   LABPROT  --   --  15.2  --   --   --   --   --   --   --   INR  --   --  1.3*  --   --   --   --   --   --   --   HEPARINUNFRC  --   --   --   --   --    < >  --  0.96* 1.10* 0.96*  CREATININE  --    < >  --    < > 7.13*  --  3.89* 3.46*  3.44*  --   --   TROPONINIHS 21*  --   --   --   --   --   --   --   --   --    < > = values in this interval not displayed.    Estimated Creatinine Clearance: 27.3 mL/min (A) (by C-G formula based on SCr of 3.44 mg/dL (H)).   Medications:  Scheduled:  . artificial tears  1 application Both Eyes M3W  . vitamin C  500 mg Oral Daily  . chlorhexidine gluconate (MEDLINE KIT)  15 mL Mouth Rinse BID  . Chlorhexidine Gluconate Cloth  6 each Topical Daily  . clonazePAM  1 mg Per Tube BID  . feeding supplement (PROSource TF)  90 mL Per Tube 5 X Daily  . insulin aspart  0-20 Units Subcutaneous Q4H  . insulin aspart  5 Units Subcutaneous Q4H  . mouth rinse  15 mL Mouth Rinse BID  . mouth rinse  15 mL Mouth Rinse 10 times per day  . methylPREDNISolone (SOLU-MEDROL) injection  80 mg Intravenous Q8H  . multivitamin  1 tablet Per Tube QHS  . oxyCODONE  5 mg Oral Q6H  .  sodium chloride flush  3 mL Intravenous Q12H  . zinc sulfate  220 mg Oral Daily   Infusions:  . cefTRIAXone (ROCEPHIN)  IV Stopped (07/22/20 2239)  . feeding supplement (VITAL HIGH PROTEIN) 1,000 mL (07/22/20 1245)  . fentaNYL infusion INTRAVENOUS    . heparin 10,000 units/ 20 mL infusion syringe 250 Units/hr (07/23/20 0706)  . heparin 950 Units/hr (07/23/20 1300)  . midazolam 7 mg/hr (07/23/20 1300)  . norepinephrine (LEVOPHED) Adult infusion 4 mcg/min (07/23/20  1403)  . prismasol BGK 2/2.5 replacement solution 500 mL/hr at 07/23/20 1157  . prismasol BGK 2/2.5 replacement solution 300 mL/hr at 07/22/20 0137  . prismasol BGK 4/2.5 2,500 mL/hr at 07/23/20 1315  . remdesivir 100 mg in NS 100 mL Stopped (07/23/20 0952)  .  sodium bicarbonate  infusion 1000 mL 125 mL/hr at 07/23/20 1300  . [START ON 07/24/2020] sodium bicarbonate 150 mEq in dextrose 5% 1000 mL     PRN: acetaminophen, fentaNYL, heparin, heparin, midazolam, ondansetron **OR** ondansetron (ZOFRAN) IV, sodium chloride, vecuronium  Assessment: 63 yo male with COVID PNA, now on CRRT, D-dimer rose from 7 to 17 to start IV heparin per Rx dosing for acute R DVT peroneal vein.  07/23/20, AM  Repeat Heparin level 1.1, still SUPRAtherapeutic. Confirmed from RN that was drawn from central line and not HD catheter  Note heparin 250 units/hr via CRRT circuit   CBC: Hg low 12.8, pltc WNL  No bleeding noted or infusion related issues per discussion with RN and confirmed that rate of heparin was 1500 units/hr PM  Heparin level decreased to 0.96, still supratherapeutic despite reduction in heparin infusion rate to 950 units/hr  Heparin 250 units/hr via CRRT circuit  Per RN, no issues and heparin level was drawn correctly not in HD catheter  No reported bleeding  Goal of Therapy:  Heparin level 0.3-0.7 units/ml Monitor platelets by anticoagulation protocol: Yes   Plan:   Reduce IV heparin rate from 950 units/hr to 700  units/hr  Recheck heparin level in 6 hours  Daily heparin level and CBC while on heparin  Monitor for signs/symptoms of bleeding   Adrian Saran, PharmD, BCPS [phone] until 3pm 07/23/2020 3:04 PM

## 2020-07-23 NOTE — Progress Notes (Addendum)
Inpatient Diabetes Program Recommendations  AACE/ADA: New Consensus Statement on Inpatient Glycemic Control (2015)  Target Ranges:  Prepandial:   less than 140 mg/dL      Peak postprandial:   less than 180 mg/dL (1-2 hours)      Critically ill patients:  140 - 180 mg/dL   Lab Results  Component Value Date   GLUCAP 220 (H) 07/23/2020    Review of Glycemic Control  Diabetes history: None  Current orders for Inpatient glycemic control:  Levemir 5 units bid Novolog 0-15 units Q4h ours  Vital HP 45 ml/hour Solumedrol 80 mg Q8 hours Creat/BUN: 3.46/59  Inpatient Diabetes Program Recommendations:    - Increase Levemir 10 units bid   Thanks, Tama Headings RN, MSN, BC-ADM Inpatient Diabetes Coordinator Team Pager [phone] (8a-5p)

## 2020-07-23 NOTE — Progress Notes (Signed)
  Echocardiogram 2D Echocardiogram has been performed.  Alex [name] [name] [name] 07/23/2020, 5:06 PM

## 2020-07-23 NOTE — Progress Notes (Signed)
ANTICOAGULATION CONSULT NOTE - Follow Up Consult  Pharmacy Consult for Heparin Indication: acute DVT  No Known Allergies  Patient Measurements: Height: 5' 9"  (175.3 cm) Weight: 112.5 kg (248 lb) IBW/kg (Calculated) : 70.7 Heparin Dosing Weight: 95 kg  Vital Signs: Temp: 97.5 F (36.4 C) (08/04 0305) Temp Source: Bladder (08/04 0000) BP: 167/88 (08/04 0305) Pulse Rate: 67 (08/04 0305)  Labs: Recent Labs    08/05/2020 1248 08/13/2020 1600 07/26/2020 1624 07/21/20 0006 07/21/20 2301 07/21/20 2301 07/22/20 0350 07/22/20 1800 07/22/20 2100 07/23/20 0210  HGB 17.1*  --   --    < > 13.1   < > 13.8  --   --  12.8*  HCT 51.2  --   --    < > 39.8  --  42.8  --   --  38.9*  PLT 226  --   --    < > 212  --  242  --   --  218  APTT  --   --   --   --  31  --   --   --   --   --   LABPROT  --   --   --   --  15.2  --   --   --   --   --   INR  --   --   --   --  1.3*  --   --   --   --   --   HEPARINUNFRC  --   --   --   --   --   --   --  0.66  --  0.96*  CREATININE 7.90*   < >  --    < >  --    < > 7.13*  --  3.89* 3.46*  3.44*  TROPONINIHS 23*  --  21*  --   --   --   --   --   --   --    < > = values in this interval not displayed.    Estimated Creatinine Clearance: 27.2 mL/min (A) (by C-G formula based on SCr of 3.44 mg/dL (H)).   Medications:  Scheduled:  . vitamin C  500 mg Oral Daily  . chlorhexidine gluconate (MEDLINE KIT)  15 mL Mouth Rinse BID  . Chlorhexidine Gluconate Cloth  6 each Topical Daily  . docusate  100 mg Per Tube BID  . feeding supplement (PROSource TF)  90 mL Per Tube 5 X Daily  . insulin aspart  0-15 Units Subcutaneous Q4H  . insulin aspart  5 Units Subcutaneous Q4H  . insulin detemir  5 Units Subcutaneous BID  . mouth rinse  15 mL Mouth Rinse BID  . mouth rinse  15 mL Mouth Rinse 10 times per day  . methylPREDNISolone (SOLU-MEDROL) injection  80 mg Intravenous Q8H  . multivitamin  1 tablet Per Tube QHS  . polyethylene glycol  17 g Per Tube Daily   . sodium chloride flush  3 mL Intravenous Q12H  . zinc sulfate  220 mg Oral Daily   Infusions:  . cefTRIAXone (ROCEPHIN)  IV Stopped (07/22/20 2239)  . feeding supplement (VITAL HIGH PROTEIN) 1,000 mL (07/22/20 1245)  . fentaNYL infusion INTRAVENOUS 175 mcg/hr (07/23/20 0300)  . heparin 10,000 units/ 20 mL infusion syringe 250 Units/hr (07/22/20 0135)  . heparin 1,500 Units/hr (07/23/20 0300)  . midazolam 4.5 mg/hr (07/23/20 0300)  . norepinephrine (LEVOPHED) Adult infusion 7 mcg/min (07/23/20 0300)  .  prismasol BGK 2/2.5 replacement solution 500 mL/hr at 07/22/20 1604  . prismasol BGK 2/2.5 replacement solution 300 mL/hr at 07/22/20 0137  . prismasol BGK 4/2.5 2,500 mL/hr at 07/23/20 0016  . remdesivir 100 mg in NS 100 mL Stopped (07/22/20 1128)  .  sodium bicarbonate  infusion 1000 mL 125 mL/hr at 07/23/20 0300  . [START ON 07/24/2020] sodium bicarbonate 150 mEq in dextrose 5% 1000 mL     PRN: acetaminophen, fentaNYL, heparin, heparin, midazolam, midazolam, ondansetron **OR** ondansetron (ZOFRAN) IV, sodium chloride  Assessment: 64 yo male with COVID PNA, now on CRRT, D-dimer rose from 7 to 17 to start IV heparin per Rx dosing for acute R DVT peroneal vein.   Heparin level 0.96, SUPRAtherapeutic but drawn from HD catheter where pt has CRRT heparin infusing  Note heparin 250 units/hr via CRRT circuit   CBC: Hg low 12.8, pltc WNL  No bleeding noted or infusion related issues per discussion with RN  Goal of Therapy:  Heparin level 0.3-0.7 units/ml Monitor platelets by anticoagulation protocol: Yes   Plan:   Continue Heparin infusion at 1500 units/hr  Recheck heparin level now  Daily heparin level and CBC while on heparin  Monitor for signs/symptoms of bleeding  [name], PharmD, BCPS 07/23/2020,3:56 AM

## 2020-07-23 NOTE — Procedures (Signed)
Admit: 08/14/2020 LOS: 3    Current CRRT Prescription: Start Date: 07/21/20 Catheter: Left IJ trialysis catheter placed 07/21/20 BFR: 300 Pre Blood Pump: 500 DFR: 2500 Replacement Rate: 300 Goal UF: 50-100 ml/hr to get CVP <4 mmHg Anticoagulation:  heparin Clotting: none over 24 hours    S: Tolerating CRRT well.  Increase UF as tolerates.   O: 08/03 0701 - 08/04 0700 In: 5779.5 [I.V.:4499.6; NG/GT:1070; IV Piggyback:199.9] Out: 9629 [Urine:116]  Filed Weights   07/21/20 0500 07/22/20 0324 07/23/20 0500  Weight: (!) 109.9 kg 112.5 kg 113.4 kg    Recent Labs  Lab 07/22/20 0350 07/22/20 2100 07/23/20 0210  NA 136 138 136   137  K 4.9 4.1 4.2   4.3  CL 100 103 99   100  CO2 20* 23 27   25   GLUCOSE 276* 239* 255*   256*  BUN 113* 65* 59*   59*  CREATININE 7.13* 3.89* 3.46*   3.44*  CALCIUM 7.1* 6.5* 6.9*   6.9*  PHOS 8.1* 4.8*   4.9* 4.7*   4.9*   Recent Labs  Lab 07/21/20 0307 07/21/20 0307 07/21/20 2301 07/22/20 0350 07/23/20 0210  WBC 2.8*   < > 6.2 6.6 9.9  NEUTROABS 2.1  --   --  5.8 8.6*  HGB 13.8   < > 13.1 13.8 12.8*  HCT 42.5   < > 39.8 42.8 38.9*  MCV 87.1   < > 86.7 87.0 86.6  PLT 185   < > 212 242 218   < > = values in this interval not displayed.    Scheduled Meds:  artificial tears  1 application Both Eyes B2W   vitamin C  500 mg Oral Daily   chlorhexidine gluconate (MEDLINE KIT)  15 mL Mouth Rinse BID   Chlorhexidine Gluconate Cloth  6 each Topical Daily   clonazePAM  1 mg Per Tube BID   feeding supplement (PROSource TF)  90 mL Per Tube 5 X Daily   insulin aspart  0-20 Units Subcutaneous Q4H   insulin aspart  5 Units Subcutaneous Q4H   insulin aspart  5 Units Subcutaneous Q4H   mouth rinse  15 mL Mouth Rinse BID   mouth rinse  15 mL Mouth Rinse 10 times per day   methylPREDNISolone (SOLU-MEDROL) injection  80 mg Intravenous Q8H   multivitamin  1 tablet Per Tube QHS   oxyCODONE  5 mg Oral Q6H   sodium chloride flush  3 mL  Intravenous Q12H   zinc sulfate  220 mg Oral Daily   Continuous Infusions:  cefTRIAXone (ROCEPHIN)  IV Stopped (07/22/20 2239)   feeding supplement (VITAL HIGH PROTEIN) 1,000 mL (07/22/20 1245)   fentaNYL infusion INTRAVENOUS     heparin 10,000 units/ 20 mL infusion syringe 250 Units/hr (07/23/20 0706)   heparin 950 Units/hr (07/23/20 0816)   midazolam 7 mg/hr (07/23/20 1150)   norepinephrine (LEVOPHED) Adult infusion 5 mcg/min (07/23/20 0816)   prismasol BGK 2/2.5 replacement solution 500 mL/hr at 07/23/20 1157   prismasol BGK 2/2.5 replacement solution 300 mL/hr at 07/22/20 0137   prismasol BGK 4/2.5 2,500 mL/hr at 07/23/20 1152   remdesivir 100 mg in NS 100 mL Stopped (07/23/20 0959)    sodium bicarbonate  infusion 1000 mL 125 mL/hr at 07/23/20 0816   [START ON 07/24/2020] sodium bicarbonate 150 mEq in dextrose 5% 1000 mL     PRN Meds:.acetaminophen, fentaNYL, heparin, heparin, midazolam, ondansetron **OR** ondansetron (ZOFRAN) IV, sodium chloride, vecuronium  ABG  Component Value Date/Time   PHART 7.240 (L) 07/22/2020 1340   PCO2ART 51.6 (H) 07/22/2020 1340   PO2ART 179 (H) 07/22/2020 1340   HCO3 21.4 07/22/2020 1340   ACIDBASEDEF 5.9 (H) 07/22/2020 1340   O2SAT 99.1 07/22/2020 1340    A/P  1. 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  Started on CRRT due to oliguria, hyperkalemia, and worsening acidemia on 07/21/20.     Donetta Potts, MD [facility] Pager [phone] Office [phone]

## 2020-07-23 NOTE — Progress Notes (Signed)
NAME:  Alex [name], MRN:  [id number], DOB:  12/08/1956, LOS: 3 ADMISSION DATE:  07/22/2020, CONSULTATION DATE:  8/1 REFERRING MD:  Sedonia Small, CHIEF COMPLAINT:  Dyspnea   Brief History   64 y/o male with acute hypoxemic respiratory failure due to COVID 19 pneumonia admitted on 8/1  Past Medical History  Cigarette smoker DM2 Hypertension CKD?  Significant Hospital Events   8/1 admission 8/2 intubation, start CRRT  Consults:  Nephrology  Procedures:  8/2 ETT >  8/2 L IJ CVL >  8/3 R IJ HD cath >   Significant Diagnostic Tests:  8/2 lower ext doppler/vascular ultrasound >> acute R DVT peroneal vein, left negative 8/2 renal ultrasound >> no hydro, findings consistent with medicorenal disease  Micro Data:  8/1 SARS COV 2 > positive 8/1 blood >   Antimicrobials/COVID Rx:  8/1 ceftriaxone >  8/1 remdesivir >  8/1 tocilizumab   Interim history/subjective:   More dyssynchronous this morning  Objective   Blood pressure 134/89, pulse 64, temperature (!) 97.3 F (36.3 C), resp. rate (!) 24, height 5\' 9"  (1.753 m), weight 113.4 kg, SpO2 (!) 84 %. CVP:  [3 mmHg-7 mmHg] 5 mmHg  Vent Mode: PRVC FiO2 (%):  [90 %-100 %] 90 % Set Rate:  [35 bmp] 35 bmp Vt Set:  [420 mL] 420 mL PEEP:  [12 cmH20] 12 cmH20 Plateau Pressure:  [28 cmH20-31 cmH20] 28 cmH20   Intake/Output Summary (Last 24 hours) at 07/23/2020 4259 Last data filed at 07/23/2020 0700 Gross per 24 hour  Intake 5779.46 ml  Output 4827 ml  Net 952.46 ml   Filed Weights   07/21/20 0500 07/22/20 0324 07/23/20 0500  Weight: (!) 109.9 kg 112.5 kg 113.4 kg    Examination:  General:  In bed on vent HENT: NCAT ETT in place PULM: CTA B, vent supported breathing CV: RRR, no mgr GI: BS+, soft, nontender MSK: normal bulk and tone Neuro: sedated on vent   8/4 CXR images personally reviewed> rotated image, ett/lines in place, bibasilar airspace disease  Resolved Hospital Problem list     Assessment & Plan:  ARDS due to  COVID 19 pneumonia Acute pulmonary edema due to acute renal failure Continue mechanical ventilation per ARDS protocol Target TVol 6-8cc/kgIBW Target Plateau Pressure < 30cm H20 Target driving pressure less than 15 cm of water Target PaO2 55-65: titrate PEEP/FiO2 per protocol As long as PaO2 to FiO2 ratio is less than 1:150 position in prone position for 16 hours a day Check CVP daily if CVL in place Target CVP less than 4, diurese as necessary Ventilator associated pneumonia prevention protocol 8/4 continue solumedrol, remove volume, add nimbex for better synchrony  Acute DVT R leg Heparin infusion  AKI with hyperkalemia Hyperkalemia BPH CRRT to continue > volume remove as able today Monitor BMET and UOP Replace electrolytes as needed  Hyperglycemia Change SSI to q4h Add tube feeding coverage levemir  Need for sedation for mechanical ventilation RASS target to -4 to -5 D/c PAD protocol Change to neuromuscular blockade protocol   Best practice:  Diet: regular Pain/Anxiety/Delirium protocol (if indicated): n/a VAP protocol (if indicated): n/a DVT prophylaxis: subcutaneous heparin GI prophylaxis: n/a Glucose control: SSI Mobility: bed rest Code Status: full Family Communication: I called his brother Herbie [place]  Disposition: remain in ICU  Labs   CBC: Recent Labs  Lab 07/25/2020 1248 07/21/20 0307 07/21/20 2301 07/22/20 0350 07/23/20 0210  WBC 5.6 2.8* 6.2 6.6 9.9  NEUTROABS 4.2 2.1  --  5.8  8.6*  HGB 17.1* 13.8 13.1 13.8 12.8*  HCT 51.2 42.5 39.8 42.8 38.9*  MCV 83.8 87.1 86.7 87.0 86.6  PLT 226 185 212 242 259    Basic Metabolic Panel: Recent Labs  Lab 07/21/20 0307 07/21/20 2100 07/22/20 0350 07/22/20 2100 07/23/20 0210  NA 136 138 136 138 136  137  K 5.8* 5.6* 4.9 4.1 4.2  4.3  CL 101 102 100 103 99  100  CO2 19* 15* 20* 23 27  25   GLUCOSE 260* 213* 276* 239* 255*  256*  BUN 120* 105* 113* 65* 59*  59*  CREATININE 8.43* 8.01* 7.13* 3.89*  3.46*  3.44*  CALCIUM 7.4* 7.6* 7.1* 6.5* 6.9*  6.9*  MG 2.4 2.3 2.5* 2.3 2.4  PHOS 7.9* 8.4*  8.5* 8.1* 4.8*  4.9* 4.7*  4.9*   GFR: Estimated Creatinine Clearance: 27.3 mL/min (A) (by C-G formula based on SCr of 3.44 mg/dL (H)). Recent Labs  Lab 07/30/2020 1248 08/01/2020 1248 07/28/2020 1624 07/21/20 0307 07/21/20 2301 07/22/20 0350 07/23/20 0210  PROCALCITON 4.75  --   --   --   --   --   --   WBC 5.6   < >  --  2.8* 6.2 6.6 9.9  LATICACIDVEN 2.1*  --  1.3  --   --   --   --    < > = values in this interval not displayed.    Liver Function Tests: Recent Labs  Lab 08/16/2020 1248 08/04/2020 1248 08/05/2020 1600 08/02/2020 1600 07/21/20 0307 07/21/20 2100 07/22/20 0350 07/22/20 2100 07/23/20 0210  AST 113*  --  108*  --  83*  --  49*  --  55*  ALT 46*  --  48*  --  40  --  30  --  28  ALKPHOS 59  --  59  --  57  --  65  --  64  BILITOT 0.4  --  0.3  --  0.3  --  0.5  --  0.4  PROT 7.7  --  7.8  --  6.4*  --  5.7*  --  5.4*  ALBUMIN 2.5*   < > 2.5*   < > 2.1* 1.8* 2.1* 1.9* 2.0*  2.0*   < > = values in this interval not displayed.   No results for input(s): LIPASE, AMYLASE in the last 168 hours. No results for input(s): AMMONIA in the last 168 hours.  ABG    Component Value Date/Time   PHART 7.240 (L) 07/22/2020 1340   PCO2ART 51.6 (H) 07/22/2020 1340   PO2ART 179 (H) 07/22/2020 1340   HCO3 21.4 07/22/2020 1340   ACIDBASEDEF 5.9 (H) 07/22/2020 1340   O2SAT 99.1 07/22/2020 1340     Coagulation Profile: Recent Labs  Lab 07/21/20 2301  INR 1.3*    Cardiac Enzymes: No results for input(s): CKTOTAL, CKMB, CKMBINDEX, TROPONINI in the last 168 hours.  HbA1C: No results found for: HGBA1C  CBG: Recent Labs  Lab 07/22/20 1116 07/22/20 1511 07/22/20 1925 07/22/20 2308 07/23/20 0307  GLUCAP 264* 301* 225* 190* 321*     Critical care time: 35 minutes    [name], MD [facility] Pager: [phone] Cell: [phone] If no response, call  [phone]

## 2020-07-23 NOTE — Progress Notes (Signed)
Patient noted to have HR at 35 during echo. 1 AMP Atropine given. HR now at 75.

## 2020-07-23 NOTE — Progress Notes (Signed)
Patient ID: Alex [name], male   DOB: 09/27/1956, 64 y.o.   MRN: [id number] S: No clotting overnight and tolerating CRRT without issues. O:BP 137/88   Pulse 74   Temp 97.7 F (36.5 C)   Resp (!) 35   Ht _0  (1.753 m)   Wt 113.4 kg   SpO2 100%   BMI 36.92 kg/m   Intake/Output Summary (Last 24 hours) at 07/23/2020 1224 Last data filed at 07/23/2020 1200 Gross per 24 hour  Intake 4616.87 ml  Output 5805 ml  Net -1188.13 ml   Intake/Output: I/O last 3 completed shifts: In: 7920 [I.V.:6102.6; Other:10; NG/GT:1510; IV Piggyback:297.4] Out: 5886 [Urine:291; YHCWC:3762]  Intake/Output this shift:  Total I/O In: 135.9 [I.V.:81.9; NG/GT:54] Out: 1368 [Other:1368] Weight change: 0.907 kg Gen: intubated/sedated CVS: RRR Resp: vent supported breathing without rales Abd: +BS, soft, nontender Ext: no edema  Recent Labs  Lab 08/11/2020 1248 08/01/2020 1248 08/14/2020 1600 07/21/20 0006 07/21/20 0307 07/21/20 2100 07/22/20 0350 07/22/20 2100 07/23/20 0210  NA 133*   < > 135 135 136 138 136 138 136  137  K 5.0   < > 5.2* 5.4* 5.8* 5.6* 4.9 4.1 4.2  4.3  CL 97*   < > 96* 102 101 102 100 103 99  100  CO2 16*   < > 17* 18* 19* 15* 20* _1 GLUCOSE 184*   < > 182* 247* 260* 213* 276* 239* 255*  256*  BUN 101*   < > 99* 86* 120* 105* 113* 65* 59*  59*  CREATININE 7.90*   < > 8.12* 8.52* 8.43* 8.01* 7.13* 3.89* 3.46*  3.44*  ALBUMIN 2.5*  --  2.5*  --  2.1* 1.8* 2.1* 1.9* 2.0*  2.0*  CALCIUM 8.3*   < > 8.5* 7.5* 7.4* 7.6* 7.1* 6.5* 6.9*  6.9*  PHOS  --   --   --   --  7.9* 8.4*  8.5* 8.1* 4.8*  4.9* 4.7*  4.9*  AST 113*  --  108*  --  83*  --  49*  --  55*  ALT 46*  --  48*  --  40  --  30  --  28   < > = values in this interval not displayed.   Liver Function Tests: Recent Labs  Lab 07/21/20 0307 07/21/20 2100 07/22/20 0350 07/22/20 2100 07/23/20 0210  AST 83*  --  49*  --  55*  ALT 40  --  30  --  28  ALKPHOS 57  --  65  --  64  BILITOT 0.3  --  0.5  --  0.4   PROT 6.4*  --  5.7*  --  5.4*  ALBUMIN 2.1*   < > 2.1* 1.9* 2.0*  2.0*   < > = values in this interval not displayed.   No results for input(s): LIPASE, AMYLASE in the last 168 hours. No results for input(s): AMMONIA in the last 168 hours. CBC: Recent Labs  Lab 08/04/2020 1248 08/14/2020 1248 07/21/20 0307 07/21/20 0307 07/21/20 2301 07/22/20 0350 07/23/20 0210  WBC 5.6   < > 2.8*   < > 6.2 6.6 9.9  NEUTROABS 4.2   < > 2.1  --   --  5.8 8.6*  HGB 17.1*   < > 13.8   < > 13.1 13.8 12.8*  HCT 51.2   < > 42.5   < > 39.8 42.8 38.9*  MCV 83.8  --  87.1  --  86.7 87.0 86.6  PLT 226   < > 185   < > 212 242 218   < > = values in this interval not displayed.   Cardiac Enzymes: No results for input(s): CKTOTAL, CKMB, CKMBINDEX, TROPONINI in the last 168 hours. CBG: Recent Labs  Lab 07/22/20 1925 07/22/20 2308 07/23/20 0307 07/23/20 0731 07/23/20 1119  GLUCAP 225* 190* 321* 220* 181*    Iron Studies:  Recent Labs    07/23/20 0210  FERRITIN 1,201*   Studies/Results: DG Abd 1 View  Result Date: 07/21/2020 CLINICAL DATA:  64 year old male with central line placement. EXAM: ABDOMEN - 1 VIEW; PORTABLE CHEST - 1 VIEW COMPARISON:  Chest radiograph dated 07/30/2020. FINDINGS: An endotracheal tube is noted with tip approximately 4 cm above the carina. Enteric tube extends below the diaphragm with tip and side-port in the proximal stomach. Left IJ central venous line with tip at the junction of the SVC/innominate vein. Interval worsening of bilateral airspace opacities compared to the prior radiograph. Small pleural effusions may be present. No pneumothorax. There is mild cardiomegaly. No acute osseous pathology. IMPRESSION: 1. Interval worsening of bilateral airspace opacities compared to the prior radiograph. 2. Support apparatus as described. 3. No pneumothorax. Electronically Signed   By: Anner Crete M.D.   On: 07/21/2020 18:22   US RENAL  Result Date: 07/21/2020 CLINICAL DATA:   Acute kidney injury EXAM: RENAL / URINARY TRACT ULTRASOUND COMPLETE COMPARISON:  None. FINDINGS: Right Kidney: Renal measurements: 13.9 x 6.4 x 6.6 cm = volume: 308 mL. There is increased cortical echogenicity without evidence for hydronephrosis. Left Kidney: Renal measurements: 13.1 x 6.5 x 6.5 cm = volume: 288 mL. Increased cortical echogenicity is noted. There is no hydronephrosis. Bladder: Underdistended by Foley catheter Other: None. IMPRESSION: 1. No hydronephrosis. 2. Echogenic kidneys bilaterally which can be seen in patients with medical renal disease. 3. Bladder poorly evaluated secondary to underdistention by Foley catheter. Electronically Signed   By: Constance Holster M.D.   On: 07/21/2020 21:22   DG Chest Port 1 View  Result Date: 07/23/2020 CLINICAL DATA:  Hypoxia EXAM: PORTABLE CHEST 1 VIEW COMPARISON:  July 22, 2020 FINDINGS: Endotracheal tube tip is 5.3 cm above the carina. Nasogastric tube tip and side port below the diaphragm. Right jugular catheter tip is in the superior vena cava. Left jugular catheter tip is in the superior vena cava just beyond the junction with the left innominate vein. No pneumothorax. There are small layering pleural effusions bilaterally with airspace opacity in portions of the left mid and lower lung regions. There is somewhat more diffuse interstitial edema. Heart remains prominent with pulmonary venous hypertension. No adenopathy appreciable. No bone lesions. IMPRESSION: Tube and catheter positions as described without pneumothorax. There is a degree of cardiomegaly with pulmonary vascular congestion. Areas of interstitial edema and probable alveolar edema with small pleural effusions. The overall appearance is felt to be indicative of congestive heart failure. Superimposed pneumonia on the left cannot be excluded. Electronically Signed   By: Lowella Grip III M.D.   On: 07/23/2020 08:13   DG CHEST PORT 1 VIEW  Result Date: 07/22/2020 CLINICAL DATA:   Hemodialysis catheter placement EXAM: PORTABLE CHEST 1 VIEW COMPARISON:  None. FINDINGS: Tip of the right IJ approach hemodialysis catheter is in the lower SVC. Unchanged appearance of the tip the left IJ approach central venous catheter. There is moderate interstitial pulmonary edema. Cardiomegaly is unchanged. IMPRESSION: Tip of right IJ approach hemodialysis catheter in the lower SVC. Electronically  Signed   By: Ulyses Jarred M.D.   On: 07/22/2020 00:30   DG CHEST PORT 1 VIEW  Result Date: 07/21/2020 CLINICAL DATA:  64 year old male with central line placement. EXAM: ABDOMEN - 1 VIEW; PORTABLE CHEST - 1 VIEW COMPARISON:  Chest radiograph dated 08/15/2020. FINDINGS: An endotracheal tube is noted with tip approximately 4 cm above the carina. Enteric tube extends below the diaphragm with tip and side-port in the proximal stomach. Left IJ central venous line with tip at the junction of the SVC/innominate vein. Interval worsening of bilateral airspace opacities compared to the prior radiograph. Small pleural effusions may be present. No pneumothorax. There is mild cardiomegaly. No acute osseous pathology. IMPRESSION: 1. Interval worsening of bilateral airspace opacities compared to the prior radiograph. 2. Support apparatus as described. 3. No pneumothorax. Electronically Signed   By: Anner Crete M.D.   On: 07/21/2020 18:22   VAS [place] LOWER EXTREMITY VENOUS (DVT)  Result Date: 07/21/2020  Lower Venous DVTStudy Indications: Swelling.  Risk Factors: COVID 19 positive. Limitations: Patient positioning. Comparison Study: No prior studies. Performing Technologist: Oliver Hum RVT  Examination Guidelines: A complete evaluation includes B-mode imaging, spectral Doppler, color Doppler, and power Doppler as needed of all accessible portions of each vessel. Bilateral testing is considered an integral part of a complete examination. Limited examinations for reoccurring indications may be performed as noted. The  reflux portion of the exam is performed with the patient in reverse Trendelenburg.  +---------+---------------+---------+-----------+----------+--------------+ RIGHT    CompressibilityPhasicitySpontaneityPropertiesThrombus Aging +---------+---------------+---------+-----------+----------+--------------+ CFV      Full           Yes      Yes                                 +---------+---------------+---------+-----------+----------+--------------+ SFJ      Full                                                        +---------+---------------+---------+-----------+----------+--------------+ FV Prox  Full                                                        +---------+---------------+---------+-----------+----------+--------------+ FV Mid   Full                                                        +---------+---------------+---------+-----------+----------+--------------+ FV DistalFull                                                        +---------+---------------+---------+-----------+----------+--------------+ PFV      Full                                                        +---------+---------------+---------+-----------+----------+--------------+  POP      Full           Yes      Yes                                 +---------+---------------+---------+-----------+----------+--------------+ PTV      Full                                                        +---------+---------------+---------+-----------+----------+--------------+ PERO     None                                         Acute          +---------+---------------+---------+-----------+----------+--------------+   +---------+---------------+---------+-----------+----------+--------------+ LEFT     CompressibilityPhasicitySpontaneityPropertiesThrombus Aging +---------+---------------+---------+-----------+----------+--------------+ CFV      Full           Yes       Yes                                 +---------+---------------+---------+-----------+----------+--------------+ SFJ      Full                                                        +---------+---------------+---------+-----------+----------+--------------+ FV Prox  Full                                                        +---------+---------------+---------+-----------+----------+--------------+ FV Mid   Full                                                        +---------+---------------+---------+-----------+----------+--------------+ FV DistalFull                                                        +---------+---------------+---------+-----------+----------+--------------+ PFV      Full                                                        +---------+---------------+---------+-----------+----------+--------------+ POP      Full           Yes      Yes                                 +---------+---------------+---------+-----------+----------+--------------+  PTV      Full                                                        +---------+---------------+---------+-----------+----------+--------------+ PERO     Full                                                        +---------+---------------+---------+-----------+----------+--------------+     Summary: RIGHT: - Findings consistent with acute deep vein thrombosis involving the right peroneal veins. - No cystic structure found in the popliteal fossa.  LEFT: - There is no evidence of deep vein thrombosis in the lower extremity.  - No cystic structure found in the popliteal fossa.  *See table(s) above for measurements and observations. Electronically signed by Ruta Hinds MD on 07/21/2020 at 3:40:36 PM.    Final    . artificial tears  1 application Both Eyes O4H  . vitamin C  500 mg Oral Daily  . chlorhexidine gluconate (MEDLINE KIT)  15 mL Mouth Rinse BID  . Chlorhexidine Gluconate Cloth  6  each Topical Daily  . clonazePAM  1 mg Per Tube BID  . feeding supplement (PROSource TF)  90 mL Per Tube 5 X Daily  . insulin aspart  0-20 Units Subcutaneous Q4H  . insulin aspart  5 Units Subcutaneous Q4H  . insulin aspart  5 Units Subcutaneous Q4H  . mouth rinse  15 mL Mouth Rinse BID  . mouth rinse  15 mL Mouth Rinse 10 times per day  . methylPREDNISolone (SOLU-MEDROL) injection  80 mg Intravenous Q8H  . multivitamin  1 tablet Per Tube QHS  . oxyCODONE  5 mg Oral Q6H  . sodium chloride flush  3 mL Intravenous Q12H  . zinc sulfate  220 mg Oral Daily    BMET    Component Value Date/Time   NA 137 07/23/2020 0210   NA 136 07/23/2020 0210   K 4.3 07/23/2020 0210   K 4.2 07/23/2020 0210   CL 100 07/23/2020 0210   CL 99 07/23/2020 0210   CO2 25 07/23/2020 0210   CO2 27 07/23/2020 0210   GLUCOSE 256 (H) 07/23/2020 0210   GLUCOSE 255 (H) 07/23/2020 0210   BUN 59 (H) 07/23/2020 0210   BUN 59 (H) 07/23/2020 0210   CREATININE 3.44 (H) 07/23/2020 0210   CREATININE 3.46 (H) 07/23/2020 0210   CALCIUM 6.9 (L) 07/23/2020 0210   CALCIUM 6.9 (L) 07/23/2020 0210   GFRNONAA 18 (L) 07/23/2020 0210   GFRNONAA 18 (L) 07/23/2020 0210   GFRAA 21 (L) 07/23/2020 0210   GFRAA 21 (L) 07/23/2020 0210   CBC    Component Value Date/Time   WBC 9.9 07/23/2020 0210   RBC 4.49 07/23/2020 0210   HGB 12.8 (L) 07/23/2020 0210   HCT 38.9 (L) 07/23/2020 0210   PLT 218 07/23/2020 0210   MCV 86.6 07/23/2020 0210   MCH 28.5 07/23/2020 0210   MCHC 32.9 07/23/2020 0210   RDW 13.6 07/23/2020 0210   LYMPHSABS 0.6 (L) 07/23/2020 0210   MONOABS 0.6 07/23/2020 0210   EOSABS 0.0 07/23/2020 0210   BASOSABS 0.0 07/23/2020 0210  Assessment/Plan: 1. Oliguric AKI vs progressive CKD - in setting of covid pneumonia and concomitant ARB use. Unclear what stage of CKD existed prior to this presentation as there is no baseline Scr known. He has poor medical literacy and insight. I discussed the severity of his  kidney disease and the potential need for dialysis, however he did not grasp the seriousness of his condition and just wanted to leave.  1. HD catheter placed 07/21/20 and initiated CRRT  2. Continue with CRRT  3. Systemic anticoagulation as well as intra-circuit heparin. 4. Renal US consistent with chronic medical renal disease.  5. Off of metformin and losartan 6. Goal CVP of <4 mmHg per PCCM 7. May need to change filter size if he becomes hypotensive (to smaller size) 2. Acute hypoxic respiratory failure with ARDS due to covid pneumonia. s/p intubation 07/21/20. 3. Covid pneumonia- unvaccinated smoker. He has been treated with decadron, rocephin, remdesivir per PCCM 4. Acute right leg DVT- on systemic heparin infusion. 5. Hyperkalemia- due to #1.  Improved with CRRT. 6. Metabolic acidosis- due to #1. Improving. 7. Proteinuria- likely due to underlying diabetic nephropathy, however we have seen worsening of proteinuria with covid-19 infection. 8. Disposition- unfortunately was not able to speak with Palliative care before he was intubated.   Donetta Potts, MD [employer] [phone]

## 2020-07-23 NOTE — Progress Notes (Signed)
Brief Pharmacy Note re: IV Heparin  For full details see pharmacist notes from earlier today.  Heparin level: still supra-therapeutic (0.72) on heparin infusion 700 units/hr Verified lab obtained from central line (not HD catheter) No bleeding or infusion related issues reported by RN  A/P:  Decrease Heparin infusion to 600 units/hr          Recheck heparin level in 8h  [name], PharmD, BCPS 07/23/2020@11 :37 PM

## 2020-07-23 NOTE — Progress Notes (Signed)
eLink Physician-Brief Progress Note Patient Name: Alex [name] DOB: [birth year] MRN: [id number]   Date of Service  07/23/2020  HPI/Events of Note  Notified of HR 30s. On levophed without significant increase in requirement. No significant electrolyte abnormality Ongoing CRRT  eICU Interventions  Ordered atropine 0.5 mg IV prn Titrate down sedation as tolerated Discussed with bedside RN     Intervention Category Major Interventions: Arrhythmia - evaluation and management  [name] 07/23/2020, 1:37 AM

## 2020-07-23 NOTE — Progress Notes (Signed)
ANTICOAGULATION CONSULT NOTE - Follow Up Consult  Pharmacy Consult for Heparin Indication: acute DVT  No Known Allergies  Patient Measurements: Height: _0  (175.3 cm) Weight: 113.4 kg (250 lb) IBW/kg (Calculated) : 70.7 Heparin Dosing Weight: 95 kg  Vital Signs: Temp: 97 F (36.1 C) (08/04 0700) Temp Source: Bladder (08/04 0400) BP: 124/86 (08/04 0700) Pulse Rate: 56 (08/04 0700)  Labs: Recent Labs    08/09/2020 1248 07/26/2020 1600 08/09/2020 1624 07/21/20 0006 07/21/20 2301 07/21/20 2301 07/22/20 0350 07/22/20 1800 07/22/20 2100 07/23/20 0210 07/23/20 0455  HGB 17.1*  --   --    < > 13.1   < > 13.8  --   --  12.8*  --   HCT 51.2  --   --    < > 39.8  --  42.8  --   --  38.9*  --   PLT 226  --   --    < > 212  --  242  --   --  218  --   APTT  --   --   --   --  31  --   --   --   --  >200* >200*  LABPROT  --   --   --   --  15.2  --   --   --   --   --   --   INR  --   --   --   --  1.3*  --   --   --   --   --   --   HEPARINUNFRC  --   --   --   --   --   --   --  0.66  --  0.96* 1.10*  CREATININE 7.90*   < >  --    < >  --    < > 7.13*  --  3.89* 3.46*  3.44*  --   TROPONINIHS 23*  --  21*  --   --   --   --   --   --   --   --    < > = values in this interval not displayed.    Estimated Creatinine Clearance: 27.3 mL/min (A) (by C-G formula based on SCr of 3.44 mg/dL (H)).   Medications:  Scheduled:  . vitamin C  500 mg Oral Daily  . chlorhexidine gluconate (MEDLINE KIT)  15 mL Mouth Rinse BID  . Chlorhexidine Gluconate Cloth  6 each Topical Daily  . docusate  100 mg Per Tube BID  . feeding supplement (PROSource TF)  90 mL Per Tube 5 X Daily  . insulin aspart  0-15 Units Subcutaneous Q4H  . insulin aspart  5 Units Subcutaneous Q4H  . insulin detemir  5 Units Subcutaneous BID  . mouth rinse  15 mL Mouth Rinse BID  . mouth rinse  15 mL Mouth Rinse 10 times per day  . methylPREDNISolone (SOLU-MEDROL) injection  80 mg Intravenous Q8H  . multivitamin  1  tablet Per Tube QHS  . polyethylene glycol  17 g Per Tube Daily  . sodium chloride flush  3 mL Intravenous Q12H  . zinc sulfate  220 mg Oral Daily   Infusions:  . cefTRIAXone (ROCEPHIN)  IV Stopped (07/22/20 2239)  . feeding supplement (VITAL HIGH PROTEIN) 1,000 mL (07/22/20 1245)  . fentaNYL infusion INTRAVENOUS 250 mcg/hr (07/23/20 0700)  . heparin 10,000 units/ 20 mL infusion syringe 250 Units/hr (07/23/20 0706)  .  heparin 1,500 Units/hr (07/23/20 0700)  . midazolam 5.5 mg/hr (07/23/20 0700)  . norepinephrine (LEVOPHED) Adult infusion 5 mcg/min (07/23/20 0700)  . prismasol BGK 2/2.5 replacement solution 500 mL/hr at 07/22/20 1604  . prismasol BGK 2/2.5 replacement solution 300 mL/hr at 07/22/20 0137  . prismasol BGK 4/2.5 2,500 mL/hr at 07/23/20 0016  . remdesivir 100 mg in NS 100 mL Stopped (07/22/20 1128)  .  sodium bicarbonate  infusion 1000 mL 125 mL/hr at 07/23/20 0658  . [START ON 07/24/2020] sodium bicarbonate 150 mEq in dextrose 5% 1000 mL     PRN: acetaminophen, fentaNYL, heparin, heparin, midazolam, midazolam, ondansetron **OR** ondansetron (ZOFRAN) IV, sodium chloride  Assessment: 64 yo male with COVID PNA, now on CRRT, D-dimer rose from 7 to 17 to start IV heparin per Rx dosing for acute R DVT peroneal vein.   Repeat Heparin level 1.1, still SUPRAtherapeutic. Confirmed from RN that was drawn from central line and not HD catheter  Note heparin 250 units/hr via CRRT circuit   CBC: Hg low 12.8, pltc WNL  No bleeding noted or infusion related issues per discussion with RN and confirmed that rate of heparin was 1500 units/hr  Goal of Therapy:  Heparin level 0.3-0.7 units/ml Monitor platelets by anticoagulation protocol: Yes   Plan:   Reduce IV heparin rate from 1500 to 950 units/hr  Recheck heparin level in 6 hours  Daily heparin level and CBC while on heparin  Monitor for signs/symptoms of bleeding   Adrian Saran, PharmD, BCPS [phone] until 3pm 07/23/2020  7:39 AM

## 2020-07-24 ENCOUNTER — Inpatient Hospital Stay (HOSPITAL_COMMUNITY): Payer: Medicaid Other

## 2020-07-24 LAB — MAGNESIUM: Magnesium: 2.6 mg/dL — ABNORMAL HIGH (ref 1.7–2.4)

## 2020-07-24 LAB — GLUCOSE, CAPILLARY
Glucose-Capillary: 237 mg/dL — ABNORMAL HIGH (ref 70–99)
Glucose-Capillary: 265 mg/dL — ABNORMAL HIGH (ref 70–99)
Glucose-Capillary: 283 mg/dL — ABNORMAL HIGH (ref 70–99)
Glucose-Capillary: 276 mg/dL — ABNORMAL HIGH (ref 70–99)
Glucose-Capillary: 293 mg/dL — ABNORMAL HIGH (ref 70–99)
Glucose-Capillary: 243 mg/dL — ABNORMAL HIGH (ref 70–99)

## 2020-07-24 LAB — COMPREHENSIVE METABOLIC PANEL
ALT: 38 U/L (ref 0–44)
ALT: 91 U/L — ABNORMAL HIGH (ref 0–44)
AST: 85 U/L — ABNORMAL HIGH (ref 15–41)
AST: 163 U/L — ABNORMAL HIGH (ref 15–41)
Albumin: 2.2 g/dL — ABNORMAL LOW (ref 3.5–5.0)
Albumin: 1.9 g/dL — ABNORMAL LOW (ref 3.5–5.0)
Alkaline Phosphatase: 78 U/L (ref 38–126)
Alkaline Phosphatase: 85 U/L (ref 38–126)
Anion gap: 10 (ref 5–15)
Anion gap: 26 — ABNORMAL HIGH (ref 5–15)
BUN: 47 mg/dL — ABNORMAL HIGH (ref 8–23)
BUN: 42 mg/dL — ABNORMAL HIGH (ref 8–23)
CO2: 26 mmol/L (ref 22–32)
CO2: 16 mmol/L — ABNORMAL LOW (ref 22–32)
Calcium: 7.4 mg/dL — ABNORMAL LOW (ref 8.9–10.3)
Calcium: 7.2 mg/dL — ABNORMAL LOW (ref 8.9–10.3)
Chloride: 97 mmol/L — ABNORMAL LOW (ref 98–111)
Chloride: 96 mmol/L — ABNORMAL LOW (ref 98–111)
Creatinine, Ser: 2.57 mg/dL — ABNORMAL HIGH (ref 0.61–1.24)
Creatinine, Ser: 2.88 mg/dL — ABNORMAL HIGH (ref 0.61–1.24)
GFR calc Af Amer: 30 mL/min — ABNORMAL LOW (ref >60)
GFR calc Af Amer: 26 mL/min — ABNORMAL LOW (ref >60)
GFR calc non Af Amer: 25 mL/min — ABNORMAL LOW (ref >60)
GFR calc non Af Amer: 22 mL/min — ABNORMAL LOW (ref >60)
Glucose, Bld: 252 mg/dL — ABNORMAL HIGH (ref 70–99)
Glucose, Bld: 337 mg/dL — ABNORMAL HIGH (ref 70–99)
Potassium: 4.4 mmol/L (ref 3.5–5.1)
Potassium: 2.8 mmol/L — ABNORMAL LOW (ref 3.5–5.1)
Sodium: 133 mmol/L — ABNORMAL LOW (ref 135–145)
Sodium: 138 mmol/L (ref 135–145)
Total Bilirubin: 0.3 mg/dL (ref 0.3–1.2)
Total Bilirubin: 0.5 mg/dL (ref 0.3–1.2)
Total Protein: 6 g/dL — ABNORMAL LOW (ref 6.5–8.1)
Total Protein: 5.2 g/dL — ABNORMAL LOW (ref 6.5–8.1)

## 2020-07-24 LAB — CBC WITH DIFFERENTIAL/PLATELET
Abs Immature Granulocytes: 0.35 10*3/uL — ABNORMAL HIGH (ref 0.00–0.07)
Basophils Absolute: 0 10*3/uL (ref 0.0–0.1)
Basophils Relative: 0 %
Eosinophils Absolute: 0 10*3/uL (ref 0.0–0.5)
Eosinophils Relative: 0 %
HCT: 43 % (ref 39.0–52.0)
Hemoglobin: 14 g/dL (ref 13.0–17.0)
Immature Granulocytes: 3 %
Lymphocytes Relative: 6 %
Lymphs Abs: 0.8 10*3/uL (ref 0.7–4.0)
MCH: 28.5 pg (ref 26.0–34.0)
MCHC: 32.6 g/dL (ref 30.0–36.0)
MCV: 87.6 fL (ref 80.0–100.0)
Monocytes Absolute: 0.6 10*3/uL (ref 0.1–1.0)
Monocytes Relative: 5 %
Neutro Abs: 10.4 10*3/uL — ABNORMAL HIGH (ref 1.7–7.7)
Neutrophils Relative %: 86 %
Platelets: 174 10*3/uL (ref 150–400)
RBC: 4.91 MIL/uL (ref 4.22–5.81)
RDW: 13.8 % (ref 11.5–15.5)
WBC: 12.1 10*3/uL — ABNORMAL HIGH (ref 4.0–10.5)
nRBC: 0 % (ref 0.0–0.2)

## 2020-07-24 LAB — RENAL FUNCTION PANEL
Albumin: 2.3 g/dL — ABNORMAL LOW (ref 3.5–5.0)
Albumin: 1.8 g/dL — ABNORMAL LOW (ref 3.5–5.0)
Anion gap: 12 (ref 5–15)
Anion gap: 15 (ref 5–15)
BUN: 47 mg/dL — ABNORMAL HIGH (ref 8–23)
BUN: 57 mg/dL — ABNORMAL HIGH (ref 8–23)
CO2: 25 mmol/L (ref 22–32)
CO2: 25 mmol/L (ref 22–32)
Calcium: 7.5 mg/dL — ABNORMAL LOW (ref 8.9–10.3)
Calcium: 6.7 mg/dL — ABNORMAL LOW (ref 8.9–10.3)
Chloride: 96 mmol/L — ABNORMAL LOW (ref 98–111)
Chloride: 94 mmol/L — ABNORMAL LOW (ref 98–111)
Creatinine, Ser: 2.65 mg/dL — ABNORMAL HIGH (ref 0.61–1.24)
Creatinine, Ser: 3.01 mg/dL — ABNORMAL HIGH (ref 0.61–1.24)
GFR calc Af Amer: 28 mL/min — ABNORMAL LOW (ref >60)
GFR calc Af Amer: 24 mL/min — ABNORMAL LOW (ref >60)
GFR calc non Af Amer: 25 mL/min — ABNORMAL LOW (ref >60)
GFR calc non Af Amer: 21 mL/min — ABNORMAL LOW (ref >60)
Glucose, Bld: 246 mg/dL — ABNORMAL HIGH (ref 70–99)
Glucose, Bld: 302 mg/dL — ABNORMAL HIGH (ref 70–99)
Phosphorus: 3.6 mg/dL (ref 2.5–4.6)
Phosphorus: 2.8 mg/dL (ref 2.5–4.6)
Potassium: 4.3 mmol/L (ref 3.5–5.1)
Potassium: 4.2 mmol/L (ref 3.5–5.1)
Sodium: 133 mmol/L — ABNORMAL LOW (ref 135–145)
Sodium: 134 mmol/L — ABNORMAL LOW (ref 135–145)

## 2020-07-24 LAB — BLOOD GAS, ARTERIAL
Acid-Base Excess: 1.1 mmol/L (ref 0.0–2.0)
Acid-base deficit: 13.4 mmol/L — ABNORMAL HIGH (ref 0.0–2.0)
Bicarbonate: 17.4 mmol/L — ABNORMAL LOW (ref 20.0–28.0)
Bicarbonate: 26.4 mmol/L (ref 20.0–28.0)
Drawn by: 441261
FIO2: 30
MECHVT: 420 mL
O2 Saturation: 30 %
O2 Saturation: 91.8 %
PEEP: 5 cmH2O
Patient temperature: 36
Patient temperature: 98.6
RATE: 35 resp/min
pCO2 arterial: 56 mmHg — ABNORMAL HIGH (ref 32.0–48.0)
pCO2 arterial: 46.1 mmHg (ref 32.0–48.0)
pH, Arterial: 7.111 — CL (ref 7.350–7.450)
pH, Arterial: 7.375 (ref 7.350–7.450)
pO2, Arterial: <31 mmHg — CL (ref 83.0–108.0)
pO2, Arterial: 66.1 mmHg — ABNORMAL LOW (ref 83.0–108.0)

## 2020-07-24 LAB — C-REACTIVE PROTEIN: CRP: 4.4 mg/dL — ABNORMAL HIGH (ref <1.0)

## 2020-07-24 LAB — CK TOTAL AND CKMB (NOT AT ARMC)
CK, MB: 14 ng/mL — ABNORMAL HIGH (ref 0.5–5.0)
Relative Index: 0.7 (ref 0.0–2.5)
Total CK: 2124 U/L — ABNORMAL HIGH (ref 49–397)

## 2020-07-24 LAB — LACTIC ACID, PLASMA
Lactic Acid, Venous: 8.4 mmol/L (ref 0.5–1.9)
Lactic Acid, Venous: >11 mmol/L (ref 0.5–1.9)
Lactic Acid, Venous: 3.6 mmol/L (ref 0.5–1.9)

## 2020-07-24 LAB — CBC
HCT: 44.7 % (ref 39.0–52.0)
Hemoglobin: 13.8 g/dL (ref 13.0–17.0)
MCH: 28.3 pg (ref 26.0–34.0)
MCHC: 30.9 g/dL (ref 30.0–36.0)
MCV: 91.8 fL (ref 80.0–100.0)
Platelets: 238 10*3/uL (ref 150–400)
RBC: 4.87 MIL/uL (ref 4.22–5.81)
RDW: 14.3 % (ref 11.5–15.5)
WBC: 33.6 10*3/uL — ABNORMAL HIGH (ref 4.0–10.5)
nRBC: 0.6 % — ABNORMAL HIGH (ref 0.0–0.2)

## 2020-07-24 LAB — POCT I-STAT 7, (LYTES, BLD GAS, ICA,H+H)
Acid-base deficit: 15 mmol/L — ABNORMAL HIGH (ref 0.0–2.0)
Bicarbonate: 17.7 mmol/L — ABNORMAL LOW (ref 20.0–28.0)
Calcium, Ion: 1 mmol/L — ABNORMAL LOW (ref 1.15–1.40)
HCT: 44 % (ref 39.0–52.0)
Hemoglobin: 15 g/dL (ref 13.0–17.0)
O2 Saturation: 86 %
Patient temperature: 96
Potassium: 2.8 mmol/L — ABNORMAL LOW (ref 3.5–5.1)
Sodium: 135 mmol/L (ref 135–145)
TCO2: 20 mmol/L — ABNORMAL LOW (ref 22–32)
pCO2 arterial: 65.3 mmHg (ref 32.0–48.0)
pH, Arterial: 7.031 — CL (ref 7.350–7.450)
pO2, Arterial: 70 mmHg — ABNORMAL LOW (ref 83.0–108.0)

## 2020-07-24 LAB — COOXEMETRY PANEL
Carboxyhemoglobin: 1.4 % (ref 0.5–1.5)
Methemoglobin: 0.7 % (ref 0.0–1.5)
O2 Saturation: 71.5 %
Total hemoglobin: 13 g/dL (ref 12.0–16.0)

## 2020-07-24 LAB — FERRITIN: Ferritin: 40 ng/mL (ref 24–336)

## 2020-07-24 LAB — D-DIMER, QUANTITATIVE: D-Dimer, Quant: 10.23 ug/mL-FEU — ABNORMAL HIGH (ref 0.00–0.50)

## 2020-07-24 LAB — POCT I-STAT, CHEM 8
BUN: 41 mg/dL — ABNORMAL HIGH (ref 8–23)
Calcium, Ion: 0.96 mmol/L — ABNORMAL LOW (ref 1.15–1.40)
Chloride: 97 mmol/L — ABNORMAL LOW (ref 98–111)
Creatinine, Ser: 2.8 mg/dL — ABNORMAL HIGH (ref 0.61–1.24)
Glucose, Bld: 333 mg/dL — ABNORMAL HIGH (ref 70–99)
HCT: 48 % (ref 39.0–52.0)
Hemoglobin: 16.3 g/dL (ref 13.0–17.0)
Potassium: 2.7 mmol/L — CL (ref 3.5–5.1)
Sodium: 139 mmol/L (ref 135–145)
TCO2: 17 mmol/L — ABNORMAL LOW (ref 22–32)

## 2020-07-24 LAB — TROPONIN I (HIGH SENSITIVITY)
Troponin I (High Sensitivity): 53 ng/L — ABNORMAL HIGH (ref <18)
Troponin I (High Sensitivity): 134 ng/L (ref <18)

## 2020-07-24 LAB — HEPARIN LEVEL (UNFRACTIONATED): Heparin Unfractionated: 0.37 IU/mL (ref 0.30–0.70)

## 2020-07-24 LAB — PHOSPHORUS: Phosphorus: 3.5 mg/dL (ref 2.5–4.6)

## 2020-07-24 LAB — APTT: aPTT: 58 seconds — ABNORMAL HIGH (ref 24–36)

## 2020-07-24 MED ORDER — DOPAMINE-DEXTROSE 3.2-5 MG/ML-% IV SOLN
2.5000 ug/kg/min | INTRAVENOUS | Status: DC
  Administered 2020-07-25: 2.5 ug/kg/min via INTRAVENOUS
  Filled 2020-07-24: qty 250

## 2020-07-24 MED ORDER — STERILE WATER FOR INJECTION IV SOLN
INTRAVENOUS | Status: DC
  Filled 2020-07-24 (×6): qty 150

## 2020-07-24 MED ORDER — PROPOFOL 1000 MG/100ML IV EMUL
5.0000 ug/kg/min | INTRAVENOUS | Status: DC
  Administered 2020-07-24: 10 ug/kg/min via INTRAVENOUS
  Administered 2020-07-25 – 2020-07-26 (×5): 20 ug/kg/min via INTRAVENOUS
  Administered 2020-07-27: 35 ug/kg/min via INTRAVENOUS
  Administered 2020-07-27 (×4): 30 ug/kg/min via INTRAVENOUS
  Administered 2020-07-27: 35 ug/kg/min via INTRAVENOUS
  Administered 2020-07-28: 45 ug/kg/min via INTRAVENOUS
  Administered 2020-07-28: 40 ug/kg/min via INTRAVENOUS
  Administered 2020-07-28: 50 ug/kg/min via INTRAVENOUS
  Administered 2020-07-28: 40 ug/kg/min via INTRAVENOUS
  Administered 2020-07-28: 45 ug/kg/min via INTRAVENOUS
  Administered 2020-07-28: 40 ug/kg/min via INTRAVENOUS
  Administered 2020-07-28 – 2020-07-29 (×2): 50 ug/kg/min via INTRAVENOUS
  Administered 2020-07-29: 40 ug/kg/min via INTRAVENOUS
  Administered 2020-07-29 – 2020-07-30 (×7): 50 ug/kg/min via INTRAVENOUS
  Administered 2020-07-30: 50.074 ug/kg/min via INTRAVENOUS
  Administered 2020-07-30 (×2): 50 ug/kg/min via INTRAVENOUS
  Administered 2020-07-30: 50.074 ug/kg/min via INTRAVENOUS
  Administered 2020-07-30: 50 ug/kg/min via INTRAVENOUS
  Administered 2020-07-30: 70 ug/kg/min via INTRAVENOUS
  Administered 2020-07-30 (×2): 50 ug/kg/min via INTRAVENOUS
  Administered 2020-07-31 (×2): 60 ug/kg/min via INTRAVENOUS
  Administered 2020-07-31 – 2020-08-01 (×10): 50 ug/kg/min via INTRAVENOUS
  Administered 2020-08-01: 55 ug/kg/min via INTRAVENOUS
  Administered 2020-08-01: 50 ug/kg/min via INTRAVENOUS
  Administered 2020-08-01 (×3): 55 ug/kg/min via INTRAVENOUS
  Administered 2020-08-01 – 2020-08-03 (×9): 50 ug/kg/min via INTRAVENOUS
  Administered 2020-08-03: 60 ug/kg/min via INTRAVENOUS
  Administered 2020-08-03: 50 ug/kg/min via INTRAVENOUS
  Administered 2020-08-03: 60 ug/kg/min via INTRAVENOUS
  Administered 2020-08-03 (×2): 50 ug/kg/min via INTRAVENOUS
  Administered 2020-08-03: 30 ug/kg/min via INTRAVENOUS
  Administered 2020-08-03: 80 ug/kg/min via INTRAVENOUS
  Administered 2020-08-03: 50 ug/kg/min via INTRAVENOUS
  Administered 2020-08-03: 60 ug/kg/min via INTRAVENOUS
  Administered 2020-08-04 – 2020-08-05 (×10): 50 ug/kg/min via INTRAVENOUS
  Administered 2020-08-05: 55 ug/kg/min via INTRAVENOUS
  Administered 2020-08-05 (×3): 50 ug/kg/min via INTRAVENOUS
  Administered 2020-08-05: 55 ug/kg/min via INTRAVENOUS
  Administered 2020-08-05 – 2020-08-07 (×20): 50 ug/kg/min via INTRAVENOUS
  Administered 2020-08-08 (×2): 60 ug/kg/min via INTRAVENOUS
  Administered 2020-08-08: 50 ug/kg/min via INTRAVENOUS
  Administered 2020-08-08: 55 ug/kg/min via INTRAVENOUS
  Administered 2020-08-08: 50 ug/kg/min via INTRAVENOUS
  Administered 2020-08-08: 55 ug/kg/min via INTRAVENOUS
  Administered 2020-08-08: 60 ug/kg/min via INTRAVENOUS
  Administered 2020-08-08: 50 ug/kg/min via INTRAVENOUS
  Administered 2020-08-08: 60 ug/kg/min via INTRAVENOUS
  Administered 2020-08-09 (×2): 50 ug/kg/min via INTRAVENOUS
  Administered 2020-08-09: 60 ug/kg/min via INTRAVENOUS
  Administered 2020-08-09: 50 ug/kg/min via INTRAVENOUS
  Administered 2020-08-09: 60 ug/kg/min via INTRAVENOUS
  Administered 2020-08-09 (×2): 50 ug/kg/min via INTRAVENOUS
  Administered 2020-08-09: 60 ug/kg/min via INTRAVENOUS
  Administered 2020-08-10: 50 ug/kg/min via INTRAVENOUS
  Administered 2020-08-10: 45 ug/kg/min via INTRAVENOUS
  Filled 2020-07-24 (×2): qty 100
  Filled 2020-07-24 (×3): qty 200
  Filled 2020-07-24: qty 100
  Filled 2020-07-24: qty 200
  Filled 2020-07-24: qty 100
  Filled 2020-07-24: qty 200
  Filled 2020-07-24 (×9): qty 100
  Filled 2020-07-24: qty 200
  Filled 2020-07-24: qty 100
  Filled 2020-07-24 (×2): qty 200
  Filled 2020-07-24: qty 100
  Filled 2020-07-24: qty 200
  Filled 2020-07-24: qty 100
  Filled 2020-07-24: qty 200
  Filled 2020-07-24 (×4): qty 100
  Filled 2020-07-24 (×3): qty 200
  Filled 2020-07-24 (×4): qty 100
  Filled 2020-07-24 (×4): qty 200
  Filled 2020-07-24: qty 100
  Filled 2020-07-24: qty 200
  Filled 2020-07-24 (×9): qty 100
  Filled 2020-07-24: qty 200
  Filled 2020-07-24: qty 100
  Filled 2020-07-24 (×2): qty 200
  Filled 2020-07-24: qty 100
  Filled 2020-07-24: qty 200
  Filled 2020-07-24 (×2): qty 100
  Filled 2020-07-24: qty 200
  Filled 2020-07-24: qty 100
  Filled 2020-07-24: qty 200
  Filled 2020-07-24: qty 100
  Filled 2020-07-24: qty 200
  Filled 2020-07-24 (×3): qty 100
  Filled 2020-07-24: qty 200
  Filled 2020-07-24 (×2): qty 100
  Filled 2020-07-24 (×3): qty 200
  Filled 2020-07-24: qty 100
  Filled 2020-07-24 (×3): qty 200
  Filled 2020-07-24 (×3): qty 100
  Filled 2020-07-24: qty 200
  Filled 2020-07-24 (×6): qty 100
  Filled 2020-07-24 (×2): qty 200
  Filled 2020-07-24 (×4): qty 100
  Filled 2020-07-24: qty 200

## 2020-07-24 MED ORDER — ATROPINE SULFATE 1 MG/10ML IJ SOSY
1.0000 mg | PREFILLED_SYRINGE | Freq: Once | INTRAMUSCULAR | Status: AC
  Administered 2020-07-24: 1 mg via INTRAVENOUS

## 2020-07-24 MED ORDER — HEPARIN (PORCINE) 25000 UT/250ML-% IV SOLN: 700.0000 [IU]/h | INTRAVENOUS | Status: DC

## 2020-07-24 MED ORDER — EPINEPHRINE 1 MG/10ML IJ SOSY
PREFILLED_SYRINGE | INTRAMUSCULAR | Status: AC
  Administered 2020-07-24: 1 mg
  Filled 2020-07-24: qty 20

## 2020-07-24 MED ORDER — POLYETHYLENE GLYCOL 3350 17 G PO PACK
17.0000 g | PACK | Freq: Every day | ORAL | Status: DC
  Administered 2020-07-24 – 2020-07-25 (×2): 17 g via ORAL

## 2020-07-24 MED ORDER — SODIUM BICARBONATE-DEXTROSE 150-5 MEQ/L-% IV SOLN
150.0000 meq | INTRAVENOUS | Status: DC
  Administered 2020-07-24 – 2020-07-25 (×3): 150 meq via INTRAVENOUS
  Filled 2020-07-24 (×3): qty 1000

## 2020-07-24 MED ORDER — ATROPINE SULFATE 1 MG/10ML IJ SOSY
PREFILLED_SYRINGE | INTRAMUSCULAR | Status: AC
  Filled 2020-07-24: qty 10

## 2020-07-24 MED ORDER — VASOPRESSIN 20 UNITS/100 ML INFUSION FOR SHOCK
0.0000 [IU]/min | INTRAVENOUS | Status: DC
  Administered 2020-07-24 (×2): 0.03 [IU]/min via INTRAVENOUS
  Filled 2020-07-24 (×2): qty 100

## 2020-07-24 MED ORDER — AMIODARONE HCL IN DEXTROSE 360-4.14 MG/200ML-% IV SOLN
INTRAVENOUS | Status: AC
  Administered 2020-07-24: 60 mg/h
  Filled 2020-07-24: qty 200

## 2020-07-24 MED ORDER — FENTANYL CITRATE (PF) 100 MCG/2ML IJ SOLN: 50.0000 ug | Freq: Once | INTRAMUSCULAR | Status: AC

## 2020-07-24 MED ORDER — SODIUM CHLORIDE 0.9 % IV SOLN
2.0000 g | Freq: Two times a day (BID) | INTRAVENOUS | Status: DC
  Administered 2020-07-24 – 2020-07-26 (×4): 2 g via INTRAVENOUS
  Filled 2020-07-24 (×4): qty 2

## 2020-07-24 MED ORDER — VANCOMYCIN HCL 2000 MG/400ML IV SOLN
2000.0000 mg | Freq: Once | INTRAVENOUS | Status: AC
  Administered 2020-07-24: 2000 mg via INTRAVENOUS
  Filled 2020-07-24: qty 400

## 2020-07-24 MED ORDER — SODIUM BICARBONATE 8.4 % IV SOLN
INTRAVENOUS | Status: DC
  Filled 2020-07-24 (×2): qty 150

## 2020-07-24 MED ORDER — IOHEXOL 9 MG/ML PO SOLN
500.0000 mL | ORAL | Status: AC
  Administered 2020-07-24 (×2): 500 mL via ORAL

## 2020-07-24 MED ORDER — METHYLPREDNISOLONE SODIUM SUCC 125 MG IJ SOLR
80.0000 mg | Freq: Two times a day (BID) | INTRAMUSCULAR | Status: DC
  Administered 2020-07-24 – 2020-07-25 (×2): 80 mg via INTRAVENOUS
  Filled 2020-07-24: qty 2

## 2020-07-24 MED ORDER — STERILE WATER FOR INJECTION IV SOLN
INTRAVENOUS | Status: DC
  Filled 2020-07-24 (×5): qty 150

## 2020-07-24 MED ORDER — VANCOMYCIN HCL 1250 MG/250ML IV SOLN
1250.0000 mg | INTRAVENOUS | Status: DC
  Administered 2020-07-25: 1250 mg via INTRAVENOUS
  Filled 2020-07-24 (×2): qty 250

## 2020-07-24 MED ORDER — AMIODARONE IV BOLUS ONLY 150 MG/100ML
INTRAVENOUS | Status: AC
  Filled 2020-07-24: qty 100

## 2020-07-24 MED ORDER — MIDAZOLAM HCL 2 MG/2ML IJ SOLN
2.0000 mg | INTRAMUSCULAR | Status: DC | PRN
  Administered 2020-07-28 – 2020-08-03 (×10): 2 mg via INTRAVENOUS
  Filled 2020-07-24 (×10): qty 2

## 2020-07-24 MED ORDER — SODIUM BICARBONATE 8.4 % IV SOLN
50.0000 meq | Freq: Once | INTRAVENOUS | Status: AC
  Administered 2020-07-24: 50 meq via INTRAVENOUS

## 2020-07-24 MED ORDER — DOCUSATE SODIUM 50 MG/5ML PO LIQD
100.0000 mg | Freq: Two times a day (BID) | ORAL | Status: DC
  Administered 2020-07-24 – 2020-07-25 (×2): 100 mg via ORAL
  Filled 2020-07-24 (×2): qty 10

## 2020-07-24 MED ORDER — MIDAZOLAM HCL 2 MG/2ML IJ SOLN
2.0000 mg | INTRAMUSCULAR | Status: AC | PRN
  Administered 2020-07-26 – 2020-07-27 (×3): 2 mg via INTRAVENOUS
  Filled 2020-07-24 (×4): qty 2

## 2020-07-24 MED ORDER — SODIUM BICARBONATE 8.4 % IV SOLN
INTRAVENOUS | Status: AC
  Filled 2020-07-24: qty 100

## 2020-07-24 MED ORDER — EPINEPHRINE PF 1 MG/ML IJ SOLN: 1.0000 mg | Freq: Once | INTRAMUSCULAR | Status: DC

## 2020-07-24 MED ORDER — POTASSIUM CHLORIDE 10 MEQ/50ML IV SOLN
10.0000 meq | INTRAVENOUS | Status: AC
  Administered 2020-07-24 (×4): 10 meq via INTRAVENOUS
  Filled 2020-07-24 (×4): qty 50

## 2020-07-24 MED ORDER — LACTATED RINGERS IV BOLUS
1000.0000 mL | Freq: Once | INTRAVENOUS | Status: AC
  Administered 2020-07-24: 1000 mL via INTRAVENOUS

## 2020-07-24 MED ORDER — FENTANYL BOLUS VIA INFUSION
50.0000 ug | INTRAVENOUS | Status: DC | PRN
  Administered 2020-07-24 – 2020-08-10 (×35): 50 ug via INTRAVENOUS
  Filled 2020-07-24: qty 50

## 2020-07-24 MED ORDER — INSULIN ASPART 100 UNIT/ML ~~LOC~~ SOLN
10.0000 [IU] | SUBCUTANEOUS | Status: DC
  Administered 2020-07-24 – 2020-07-31 (×42): 10 [IU] via SUBCUTANEOUS

## 2020-07-24 MED ORDER — EPINEPHRINE 1 MG/10ML IJ SOSY
1.0000 mg | PREFILLED_SYRINGE | Freq: Once | INTRAMUSCULAR | Status: AC
  Administered 2020-07-24: 1 mg via INTRAVENOUS

## 2020-07-24 MED ORDER — FENTANYL 2500MCG IN NS 250ML (10MCG/ML) PREMIX INFUSION
50.0000 ug/h | INTRAVENOUS | Status: DC
  Administered 2020-07-24: 100 ug/h via INTRAVENOUS
  Administered 2020-07-25: 125 ug/h via INTRAVENOUS
  Administered 2020-07-26: 100 ug/h via INTRAVENOUS
  Administered 2020-07-27 – 2020-07-28 (×3): 175 ug/h via INTRAVENOUS
  Administered 2020-07-29: 225 ug/h via INTRAVENOUS
  Administered 2020-07-29: 125 ug/h via INTRAVENOUS
  Administered 2020-07-30: 250 ug/h via INTRAVENOUS
  Administered 2020-07-30: 225 ug/h via INTRAVENOUS
  Administered 2020-07-31 – 2020-08-02 (×8): 250 ug/h via INTRAVENOUS
  Administered 2020-08-03 (×2): 300 ug/h via INTRAVENOUS
  Administered 2020-08-03 – 2020-08-05 (×5): 250 ug/h via INTRAVENOUS
  Administered 2020-08-05 – 2020-08-08 (×10): 300 ug/h via INTRAVENOUS
  Administered 2020-08-09 (×2): 350 ug/h via INTRAVENOUS
  Administered 2020-08-09: 300 ug/h via INTRAVENOUS
  Administered 2020-08-10 (×2): 350 ug/h via INTRAVENOUS
  Filled 2020-07-24 (×42): qty 250

## 2020-07-24 MED ORDER — IOHEXOL 9 MG/ML PO SOLN
ORAL | Status: AC
  Filled 2020-07-24: qty 1000

## 2020-07-24 MED ORDER — SODIUM BICARBONATE 8.4 % IV SOLN
100.0000 meq | Freq: Once | INTRAVENOUS | Status: AC
  Administered 2020-07-24: 100 meq via INTRAVENOUS
  Filled 2020-07-24: qty 100

## 2020-07-24 NOTE — Progress Notes (Signed)
Patient weaned to +5 of PEEP and 30% per O2 sats of 100% on +10 of PEEP and 40%. Patient currently 100%. RT will continue to assess patient.

## 2020-07-24 NOTE — Progress Notes (Addendum)
[facility] PCCM  Sudden change in status: blood pressure dropped, became bradycardic VBG (most likely, doubt arterial stick) shows severe acidosis I placed arterial line emergently bolused 1 L LR Started back levophed Started vasporessin STAT CXR with bilateral infiltrates, no pneumothorax, ? Lucency around mediastinal structures  Plan Stat cbc, cmet, abg, lactic acid, troponin EKG Hold heparin as suspicion for hemorrhage (hypothremia, hypotension) May add back epinephrine infusion if no improvement  Additional CC time 45 minutes  [name], MD [facility] Pager: [phone] Cell: [phone] If no response, call [phone]

## 2020-07-24 NOTE — Progress Notes (Signed)
NAME:  Alex [name], MRN:  [id number], DOB:  November 06, 1956, LOS: 4 ADMISSION DATE:  08/03/2020, CONSULTATION DATE:  8/1 REFERRING MD:  Sedonia Small, CHIEF COMPLAINT:  Dyspnea   Brief History   64 y/o male with acute hypoxemic respiratory failure due to COVID 19 pneumonia admitted on 8/1  Past Medical History  Cigarette smoker DM2 Hypertension CKD?  Significant Hospital Events   8/1 admission 8/2 intubation, start CRRT 8/5 significant improvement in oxygenation  Consults:  Nephrology  Procedures:  8/2 ETT >  8/2 L IJ CVL >  8/3 R IJ HD cath >   Significant Diagnostic Tests:  8/2 lower ext doppler/vascular ultrasound >> acute R DVT peroneal vein, left negative 8/2 renal ultrasound >> no hydro, findings consistent with medicorenal disease  Micro Data:  8/1 SARS COV 2 > positive 8/1 blood >   Antimicrobials/COVID Rx:  8/1 ceftriaxone > 8/5 8/1 remdesivir >  8/1 tocilizumab   Interim history/subjective:   Oxygenation significantly improved   Objective   Blood pressure (!) 149/84, pulse 81, temperature 98.1 F (36.7 C), resp. rate (!) 3, height 5\' 9"  (1.753 m), weight 112.5 kg, SpO2 100 %. CVP:  [5 mmHg-12 mmHg] 5 mmHg  Vent Mode: PRVC FiO2 (%):  [40 %-100 %] 40 % Set Rate:  [35 bmp] 35 bmp Vt Set:  [420 mL] 420 mL PEEP:  [10 ZDG38-75 cmH20] 10 cmH20 Plateau Pressure:  [25 cmH20-30 cmH20] 30 cmH20   Intake/Output Summary (Last 24 hours) at 07/24/2020 0750 Last data filed at 07/24/2020 0700 Gross per 24 hour  Intake 5227.75 ml  Output 7323 ml  Net -2095.25 ml   Filed Weights   07/22/20 0324 07/23/20 0500 07/24/20 0500  Weight: 112.5 kg 113.4 kg 112.5 kg    Examination:  General:  In bed on vent HENT: NCAT ETT in place PULM: Crackles bases B, vent supported breathing CV: RRR, no mgr GI: BS+, soft, nontender MSK: normal bulk and tone Neuro: sedated on vent  8/5 CXR images personally reviewed> rotated image, ett/lines in place, bibasilar airspace  disease  Resolved Hospital Problem list     Assessment & Plan:  ARDS due to COVID 19 pneumonia > improving oxygenation as of 8/5 Acute pulmonary edema due to acute renal failure Check ABG Likely change to conventional ventilation protocol today after checking ABG Full mechanical vent support VAP prevention Daily WUA/SBT Plan decrease solumedrol on 8/5, then lower again on 8/6  Acute DVT R leg Discuss with pharmacy, could change to oral anticoagulant from my standpoint  AKI with hyperkalemia Hyperkalemia BPH Remove as much volume as possible with CRRT Monitor BMET and UOP Replace electrolytes as needed  Hyperglycemia Decrease solumedrol Increase tube feeding coverage SSI levemir  Need for sedation for mechanical ventilation Change RASS target to -1 Stop neuromuscular blockade protocol, versed infusion, oxycodone and clonazepam Continue fentanyl infusion PAD protocol   Best practice:  Diet: regular Pain/Anxiety/Delirium protocol (if indicated): n/a VAP protocol (if indicated): n/a DVT prophylaxis: subcutaneous heparin GI prophylaxis: n/a Glucose control: SSI Mobility: bed rest Code Status: full Family Communication: I called his brother Herbie [place] on 8/5 for an update Disposition: remain in ICU  Labs   CBC: Recent Labs  Lab 08/18/2020 1248 07/23/2020 1248 07/21/20 0307 07/21/20 2301 07/22/20 0350 07/23/20 0210 07/24/20 0359  WBC 5.6   < > 2.8* 6.2 6.6 9.9 12.1*  NEUTROABS 4.2  --  2.1  --  5.8 8.6* 10.4*  HGB 17.1*   < > 13.8 13.1 13.8 12.8*  14.0  HCT 51.2   < > 42.5 39.8 42.8 38.9* 43.0  MCV 83.8   < > 87.1 86.7 87.0 86.6 87.6  PLT 226   < > 185 212 242 218 174   < > = values in this interval not displayed.    Basic Metabolic Panel: Recent Labs  Lab 07/21/20 2100 07/21/20 2100 07/22/20 0350 07/22/20 2100 07/23/20 0210 07/23/20 1600 07/24/20 0359  NA 138   < > 136 138 136  137 136 133*  133*  K 5.6*   < > 4.9 4.1 4.2  4.3 4.3 4.4  4.3  CL  102   < > 100 103 99  100 97* 97*  96*  CO2 15*   < > 20* 23 27  25 26 26  25   GLUCOSE 213*   < > 276* 239* 255*  256* 275* 252*  246*  BUN 105*   < > 113* 65* 59*  59* 48* 47*  47*  CREATININE 8.01*   < > 7.13* 3.89* 3.46*  3.44* 3.07* 2.57*  2.65*  CALCIUM 7.6*   < > 7.1* 6.5* 6.9*  6.9* 7.0* 7.4*  7.5*  MG 2.3  --  2.5* 2.3 2.4  --  2.6*  PHOS 8.4*  8.5*   < > 8.1* 4.8*  4.9* 4.7*  4.9* 4.3 3.5  3.6   < > = values in this interval not displayed.   GFR: Estimated Creatinine Clearance: 36.4 mL/min (A) (by C-G formula based on SCr of 2.57 mg/dL (H)). Recent Labs  Lab 08/01/2020 1248 08/17/2020 1624 07/21/20 0307 07/21/20 2301 07/22/20 0350 07/23/20 0210 07/24/20 0359  PROCALCITON 4.75  --   --   --   --   --   --   WBC 5.6  --    < > 6.2 6.6 9.9 12.1*  LATICACIDVEN 2.1* 1.3  --   --   --   --   --    < > = values in this interval not displayed.    Liver Function Tests: Recent Labs  Lab 07/26/2020 1600 08/04/2020 1600 07/21/20 0307 07/21/20 2100 07/22/20 0350 07/22/20 2100 07/23/20 0210 07/23/20 1600 07/24/20 0359  AST 108*  --  83*  --  49*  --  55*  --  85*  ALT 48*  --  40  --  30  --  28  --  38  ALKPHOS 59  --  57  --  65  --  64  --  78  BILITOT 0.3  --  0.3  --  0.5  --  0.4  --  0.3  PROT 7.8  --  6.4*  --  5.7*  --  5.4*  --  6.0*  ALBUMIN 2.5*   < > 2.1*   < > 2.1* 1.9* 2.0*  2.0* 2.1* 2.2*  2.3*   < > = values in this interval not displayed.   No results for input(s): LIPASE, AMYLASE in the last 168 hours. No results for input(s): AMMONIA in the last 168 hours.  ABG    Component Value Date/Time   PHART 7.240 (L) 07/22/2020 1340   PCO2ART 51.6 (H) 07/22/2020 1340   PO2ART 179 (H) 07/22/2020 1340   HCO3 21.4 07/22/2020 1340   ACIDBASEDEF 5.9 (H) 07/22/2020 1340   O2SAT 99.1 07/22/2020 1340     Coagulation Profile: Recent Labs  Lab 07/21/20 2301  INR 1.3*    Cardiac Enzymes: No results for input(s): CKTOTAL, CKMB, CKMBINDEX,  TROPONINI in the last 168 hours.  HbA1C: Hgb A1c MFr Bld  Date/Time Value Ref Range Status  07/23/2020 09:36 AM 12.3 (H) 4.8 - 5.6 % Final    Comment:    (NOTE) Pre diabetes:          5.7%-6.4%  Diabetes:              >6.4%  Glycemic control for   <7.0% adults with diabetes     CBG: Recent Labs  Lab 07/23/20 1119 07/23/20 1545 07/23/20 1937 07/23/20 2346 07/24/20 0403  GLUCAP 181* 192* 340* 154* 237*     Critical care time: 35 minutes    [name], MD [facility] Pager: [phone] Cell: [phone] If no response, call [phone]

## 2020-07-24 NOTE — Progress Notes (Signed)
[facility]  Labs reviewed: K low, Hgb stable, Lactic acid > 11 Shock now improved slightly Bowel injury suspected EKG with non-specific ST wave changes in anterior precordial leads Bradycardia episodes have improved  Plan: Stat CT abdomen/pelvis Restart heparin Continue aggressive resuscitation Increase minute ventilation on vent by increasing TVol to 8cc/kg IBW Send COOX Follow serial lactic acid   Brother updated by phone  [name], MD [facility] Pager: [phone] Cell: [phone] If no response, call [phone]

## 2020-07-24 NOTE — Progress Notes (Signed)
Patient transported to CT without complication. ABG obtained and sent to lab.

## 2020-07-24 NOTE — Progress Notes (Signed)
Patient ID: BAYDEN GIL, male   DOB: Dec 31, 1955, 64 y.o.   MRN: [id number] S: Had another episode of bradycardia last night which responded to atropine O:BP (!) 141/79   Pulse (!) 105   Temp 97.7 F (36.5 C)   Resp (!) 35 Comment: Vent set rate  Ht 5' 9"  (1.753 m)   Wt 112.5 kg   SpO2 100%   BMI 36.62 kg/m   Intake/Output Summary (Last 24 hours) at 07/24/2020 0835 Last data filed at 07/24/2020 0800 Gross per 24 hour  Intake 5301.69 ml  Output 7076 ml  Net -1774.31 ml   Intake/Output: I/O last 3 completed shifts: In: 8121.5 [I.V.:6057.5; PH/XT:0569; IV VXYIAXKPV:374] Out: 82707 [Urine:35; EMLJQ:49201]  Intake/Output this shift:  Total I/O In: 209.9 [I.V.:164.9; NG/GT:45] Out: -  Weight change: -0.907 kg Gen: intubated and sedated Physical exam: unable to complete due to COVID + status.  In order to preserve PPE equipment and to minimize exposure to providers.  Notes from other caregivers reviewed   Recent Labs  Lab 08/07/2020 1248 08/06/2020 1248 08/07/2020 1600 07/21/20 0006 07/21/20 0307 07/21/20 2100 07/22/20 0350 07/22/20 2100 07/23/20 0210 07/23/20 1600 07/24/20 0359  NA 133*   < > 135   < > 136 138 136 138 136  137 136 133*  133*  K 5.0   < > 5.2*   < > 5.8* 5.6* 4.9 4.1 4.2  4.3 4.3 4.4  4.3  CL 97*   < > 96*   < > 101 102 100 103 99  100 97* 97*  96*  CO2 16*   < > 17*   < > 19* 15* 20* 23 27  25 26 26  25   GLUCOSE 184*   < > 182*   < > 260* 213* 276* 239* 255*  256* 275* 252*  246*  BUN 101*   < > 99*   < > 120* 105* 113* 65* 59*  59* 48* 47*  47*  CREATININE 7.90*   < > 8.12*   < > 8.43* 8.01* 7.13* 3.89* 3.46*  3.44* 3.07* 2.57*  2.65*  ALBUMIN 2.5*   < > 2.5*  --  2.1* 1.8* 2.1* 1.9* 2.0*  2.0* 2.1* 2.2*  2.3*  CALCIUM 8.3*   < > 8.5*   < > 7.4* 7.6* 7.1* 6.5* 6.9*  6.9* 7.0* 7.4*  7.5*  PHOS  --   --   --   --  7.9* 8.4*  8.5* 8.1* 4.8*  4.9* 4.7*  4.9* 4.3 3.5  3.6  AST 113*  --  108*  --  83*  --  49*  --  55*  --  85*  ALT 46*  --   48*  --  40  --  30  --  28  --  38   < > = values in this interval not displayed.   Liver Function Tests: Recent Labs  Lab 07/22/20 0350 07/22/20 2100 07/23/20 0210 07/23/20 1600 07/24/20 0359  AST 49*  --  55*  --  85*  ALT 30  --  28  --  38  ALKPHOS 65  --  64  --  78  BILITOT 0.5  --  0.4  --  0.3  PROT 5.7*  --  5.4*  --  6.0*  ALBUMIN 2.1*   < > 2.0*  2.0* 2.1* 2.2*  2.3*   < > = values in this interval not displayed.   No results for input(s): LIPASE,  AMYLASE in the last 168 hours. No results for input(s): AMMONIA in the last 168 hours. CBC: Recent Labs  Lab 07/21/20 0307 07/21/20 0307 07/21/20 2301 07/21/20 2301 07/22/20 0350 07/23/20 0210 07/24/20 0359  WBC 2.8*   < > 6.2   < > 6.6 9.9 12.1*  NEUTROABS 2.1   < >  --   --  5.8 8.6* 10.4*  HGB 13.8   < > 13.1   < > 13.8 12.8* 14.0  HCT 42.5   < > 39.8   < > 42.8 38.9* 43.0  MCV 87.1  --  86.7  --  87.0 86.6 87.6  PLT 185   < > 212   < > 242 218 174   < > = values in this interval not displayed.   Cardiac Enzymes: No results for input(s): CKTOTAL, CKMB, CKMBINDEX, TROPONINI in the last 168 hours. CBG: Recent Labs  Lab 07/23/20 1545 07/23/20 1937 07/23/20 2346 07/24/20 0403 07/24/20 0803  GLUCAP 192* 340* 154* 237* 265*    Iron Studies:  Recent Labs    07/24/20 0359  FERRITIN 40   Studies/Results: DG Chest Port 1 View  Result Date: 07/24/2020 CLINICAL DATA:  Respiratory failure. EXAM: PORTABLE CHEST 1 VIEW COMPARISON:  07/23/2020. FINDINGS: Endotracheal tube, NG tube, bilateral IJ lines in stable position. Stable cardiomegaly. Diffuse unchanged bilateral pulmonary infiltrates/edema. Small bilateral pleural effusions cannot be excluded. No pneumothorax. IMPRESSION: 1.  Lines and tubes in stable position. 2.  Cardiomegaly, unchanged from prior exam. 3. Diffuse bilateral pulmonary infiltrates/edema again noted without interim change. Small bilateral pleural effusions again cannot be excluded. Chest  appears unchanged from prior exam. Electronically Signed   By: Marcello Moores  Register   On: 07/24/2020 06:55   DG Chest Port 1 View  Result Date: 07/23/2020 CLINICAL DATA:  Hypoxia EXAM: PORTABLE CHEST 1 VIEW COMPARISON:  July 22, 2020 FINDINGS: Endotracheal tube tip is 5.3 cm above the carina. Nasogastric tube tip and side port below the diaphragm. Right jugular catheter tip is in the superior vena cava. Left jugular catheter tip is in the superior vena cava just beyond the junction with the left innominate vein. No pneumothorax. There are small layering pleural effusions bilaterally with airspace opacity in portions of the left mid and lower lung regions. There is somewhat more diffuse interstitial edema. Heart remains prominent with pulmonary venous hypertension. No adenopathy appreciable. No bone lesions. IMPRESSION: Tube and catheter positions as described without pneumothorax. There is a degree of cardiomegaly with pulmonary vascular congestion. Areas of interstitial edema and probable alveolar edema with small pleural effusions. The overall appearance is felt to be indicative of congestive heart failure. Superimposed pneumonia on the left cannot be excluded. Electronically Signed   By: Lowella Grip III M.D.   On: 07/23/2020 08:13   ECHOCARDIOGRAM COMPLETE  Result Date: 07/23/2020    ECHOCARDIOGRAM REPORT   Patient Name:   EADEN HETTINGER Date of Exam: 07/23/2020 Medical Rec #:  [id number]    Height:       69.0 in Accession #:    [id number]   Weight:       250.0 lb Date of Birth:  [birth year]    BSA:          2.272 m Patient Age:    64 years     BP:           117/65 mmHg Patient Gender: M            HR:  42 bpm. Exam Location:  Inpatient Procedure: 2D Echo, Cardiac Doppler, Color Doppler and Intracardiac            Opacification Agent Indications:    Dyspnea 786.09 / R06.00  History:        Patient has no prior history of Echocardiogram examinations.                 COVID - 19 Positive.  Sonographer:     Jonelle Sidle Dance Referring Phys: [place] Comments: Echo performed with patient supine and on artificial respirator and patient is morbidly obese. IMPRESSIONS  1. Left ventricular ejection fraction, by estimation, is 60 to 65%. The left ventricle has normal function. The left ventricle has no regional wall motion abnormalities. There is mild left ventricular hypertrophy of the basal-septal segment. Left ventricular diastolic parameters were normal.  2. Right ventricular systolic function is normal. The right ventricular size is normal. There is normal pulmonary artery systolic pressure.  3. The mitral valve is normal in structure. No evidence of mitral valve regurgitation. No evidence of mitral stenosis.  4. The aortic valve is normal in structure. Aortic valve regurgitation is not visualized. No aortic stenosis is present.  5. The inferior vena cava is normal in size with greater than 50% respiratory variability, suggesting right atrial pressure of 3 mmHg. FINDINGS  Left Ventricle: Left ventricular ejection fraction, by estimation, is 60 to 65%. The left ventricle has normal function. The left ventricle has no regional wall motion abnormalities. Definity contrast agent was given IV to delineate the left ventricular  endocardial borders. The left ventricular internal cavity size was normal in size. There is mild left ventricular hypertrophy of the basal-septal segment. Left ventricular diastolic parameters were normal. Normal left ventricular filling pressure. Right Ventricle: The right ventricular size is normal. No increase in right ventricular wall thickness. Right ventricular systolic function is normal. There is normal pulmonary artery systolic pressure. The tricuspid regurgitant velocity is 2.27 m/s, and  with an assumed right atrial pressure of 8 mmHg, the estimated right ventricular systolic pressure is 04.5 mmHg. Left Atrium: Left atrial size was normal in size. Right Atrium: Right  atrial size was normal in size. Pericardium: There is no evidence of pericardial effusion. Mitral Valve: The mitral valve is normal in structure. Normal mobility of the mitral valve leaflets. No evidence of mitral valve regurgitation. No evidence of mitral valve stenosis. Tricuspid Valve: The tricuspid valve is normal in structure. Tricuspid valve regurgitation is mild . No evidence of tricuspid stenosis. Aortic Valve: The aortic valve is normal in structure. Aortic valve regurgitation is not visualized. No aortic stenosis is present. Pulmonic Valve: The pulmonic valve was normal in structure. Pulmonic valve regurgitation is not visualized. No evidence of pulmonic stenosis. Aorta: The aortic root is normal in size and structure. Venous: The inferior vena cava is normal in size with greater than 50% respiratory variability, suggesting right atrial pressure of 3 mmHg. IAS/Shunts: No atrial level shunt detected by color flow Doppler.  LEFT VENTRICLE PLAX 2D LVIDd:         4.60 cm  Diastology LVIDs:         3.10 cm  LV e' lateral:   7.94 cm/s LV PW:         1.10 cm  LV E/e' lateral: 8.4 LV IVS:        1.40 cm  LV e' medial:    6.96 cm/s LVOT diam:     2.40 cm  LV E/e' medial:  9.6 LV SV:         111 LV SV Index:   49 LVOT Area:     4.52 cm  IVC IVC diam: 2.30 cm LEFT ATRIUM           Index       RIGHT ATRIUM           Index LA diam:      3.60 cm 1.58 cm/m  RA Area:     16.00 cm LA Vol (A2C): 69.7 ml 30.68 ml/m RA Volume:   40.10 ml  17.65 ml/m LA Vol (A4C): 42.1 ml 18.53 ml/m  AORTIC VALVE LVOT Vmax:   125.00 cm/s LVOT Vmean:  80.000 cm/s LVOT VTI:    0.246 m  AORTA Ao Root diam: 3.70 cm Ao Asc diam:  3.50 cm MITRAL VALVE               TRICUSPID VALVE MV Area (PHT): 2.83 cm    TR Peak grad:   20.6 mmHg MV Decel Time: 268 msec    TR Vmax:        227.00 cm/s MV E velocity: 66.80 cm/s MV A velocity: 50.80 cm/s  SHUNTS MV E/A ratio:  1.31        Systemic VTI:  0.25 m                            Systemic Diam: 2.40 cm  Mihai Croitoru MD Electronically signed by Sanda Klein MD Signature Date/Time: 07/23/2020/5:41:18 PM    Final    . artificial tears  1 application Both Eyes Y1V  . vitamin C  500 mg Oral Daily  . chlorhexidine gluconate (MEDLINE KIT)  15 mL Mouth Rinse BID  . Chlorhexidine Gluconate Cloth  6 each Topical Daily  . clonazePAM  1 mg Per Tube BID  . feeding supplement (PROSource TF)  90 mL Per Tube 5 X Daily  . insulin aspart  0-20 Units Subcutaneous Q4H  . insulin aspart  5 Units Subcutaneous Q4H  . mouth rinse  15 mL Mouth Rinse BID  . mouth rinse  15 mL Mouth Rinse 10 times per day  . methylPREDNISolone (SOLU-MEDROL) injection  80 mg Intravenous Q8H  . multivitamin  1 tablet Per Tube QHS  . oxyCODONE  5 mg Oral Q6H  . sodium chloride flush  3 mL Intravenous Q12H  . zinc sulfate  220 mg Oral Daily    BMET    Component Value Date/Time   NA 133 (L) 07/24/2020 0359   NA 133 (L) 07/24/2020 0359   K 4.4 07/24/2020 0359   K 4.3 07/24/2020 0359   CL 97 (L) 07/24/2020 0359   CL 96 (L) 07/24/2020 0359   CO2 26 07/24/2020 0359   CO2 25 07/24/2020 0359   GLUCOSE 252 (H) 07/24/2020 0359   GLUCOSE 246 (H) 07/24/2020 0359   BUN 47 (H) 07/24/2020 0359   BUN 47 (H) 07/24/2020 0359   CREATININE 2.57 (H) 07/24/2020 0359   CREATININE 2.65 (H) 07/24/2020 0359   CALCIUM 7.4 (L) 07/24/2020 0359   CALCIUM 7.5 (L) 07/24/2020 0359   GFRNONAA 25 (L) 07/24/2020 0359   GFRNONAA 25 (L) 07/24/2020 0359   GFRAA 30 (L) 07/24/2020 0359   GFRAA 28 (L) 07/24/2020 0359   CBC    Component Value Date/Time   WBC 12.1 (H) 07/24/2020 0359   RBC 4.91 07/24/2020 0359   HGB 14.0  07/24/2020 0359   HCT 43.0 07/24/2020 0359   PLT 174 07/24/2020 0359   MCV 87.6 07/24/2020 0359   MCH 28.5 07/24/2020 0359   MCHC 32.6 07/24/2020 0359   RDW 13.8 07/24/2020 0359   LYMPHSABS 0.8 07/24/2020 0359   MONOABS 0.6 07/24/2020 0359   EOSABS 0.0 07/24/2020 0359   BASOSABS 0.0 07/24/2020 0359     Assessment/Plan: 1. OliguricAKI vs progressive CKD - in setting of covid pneumonia and concomitant ARBuse. Unclearwhat stage of CKD existed prior to this presentation as there is no baseline Scr known. He has poor medical literacy and insight. I discussed the severity of his kidney disease and the potential need for dialysis, however he did not grasp the seriousness of his condition and just wantedto leave.  1. HD catheter placed 07/21/20 and initiated CRRT  2. Systemic anticoagulation as well as intra-circuit heparin. 3. Renal USconsistent with chronic medical renal disease. 4. Off ofmetformin and losartan 5. Goal CVP of <4 mmHg per PCCM 6. Continue with CRRT 2. Acute hypoxic respiratory failure with ARDS due to covid pneumonia.s/pintubation 07/21/20. 3. Covid pneumonia- unvaccinated smoker. He has been treated with decadron, rocephin, remdesivir per PCCM 4. Acute right leg DVT- on systemic heparin infusion. 5. Hyperkalemia- due to #1.Improved with CRRT. 6. Metabolic acidosis- due to #1. Improving with CRRT.  Can stop IV bicarb if ok with PCCM. 7. Proteinuria- likely due to underlying diabetic nephropathy, however we have seen worsening of proteinuria with covid-19 infection. 8. Disposition-unfortunately was not able to speak with Palliative care before he was intubated.   Donetta Potts, MD [employer] [phone]

## 2020-07-24 NOTE — Procedures (Signed)
Admit: 08/01/2020 LOS: 4  Had another episode of bradycardia last night which responded to atropine  Current CRRT Prescription: Start Date: 07/21/20 Catheter: LIJ trialysis cath placed 07/21/20 BFR: 300 Pre Blood Pump: 500 DFR: 2500 Replacement Rate: 300 Goal UF:  50-100 ml/hr for CVP of 4 mmHg Anticoagulation: heparin Clotting:  none    S: Tolerating CRRT  O: 08/04 0701 - 08/05 0700 In: 5227.8 [I.V.:3853.8; NG/GT:1274; IV Piggyback:100] Out: 1660 [Urine:10]  Filed Weights   07/22/20 0324 07/23/20 0500 07/24/20 0500  Weight: 112.5 kg 113.4 kg 112.5 kg    Recent Labs  Lab 07/23/20 0210 07/23/20 1600 07/24/20 0359  NA 136   137 136 133*   133*  K 4.2   4.3 4.3 4.4   4.3  CL 99   100 97* 97*   96*  CO2 _0 GLUCOSE 255*   256* 275* 252*   246*  BUN 59*   59* 48* 47*   47*  CREATININE 3.46*   3.44* 3.07* 2.57*   2.65*  CALCIUM 6.9*   6.9* 7.0* 7.4*   7.5*  PHOS 4.7*   4.9* 4.3 3.5   3.6   Recent Labs  Lab 07/22/20 0350 07/23/20 0210 07/24/20 0359  WBC 6.6 9.9 12.1*  NEUTROABS 5.8 8.6* 10.4*  HGB 13.8 12.8* 14.0  HCT 42.8 38.9* 43.0  MCV 87.0 86.6 87.6  PLT 242 218 174    Scheduled Meds:  artificial tears  1 application Both Eyes A0O   vitamin C  500 mg Oral Daily   chlorhexidine gluconate (MEDLINE KIT)  15 mL Mouth Rinse BID   Chlorhexidine Gluconate Cloth  6 each Topical Daily   clonazePAM  1 mg Per Tube BID   feeding supplement (PROSource TF)  90 mL Per Tube 5 X Daily   insulin aspart  0-20 Units Subcutaneous Q4H   insulin aspart  5 Units Subcutaneous Q4H   mouth rinse  15 mL Mouth Rinse BID   mouth rinse  15 mL Mouth Rinse 10 times per day   methylPREDNISolone (SOLU-MEDROL) injection  80 mg Intravenous Q8H   multivitamin  1 tablet Per Tube QHS   oxyCODONE  5 mg Oral Q6H   sodium chloride flush  3 mL Intravenous Q12H   zinc sulfate  220 mg Oral Daily   Continuous Infusions:  cefTRIAXone (ROCEPHIN)  IV Stopped  (07/23/20 2237)   feeding supplement (VITAL HIGH PROTEIN) 45 mL/hr at 07/23/20 2200   fentaNYL infusion INTRAVENOUS 300 mcg/hr (07/24/20 0800)   heparin 10,000 units/ 20 mL infusion syringe 250 Units/hr (07/23/20 0706)   heparin 600 Units/hr (07/24/20 0800)   midazolam 8 mg/hr (07/24/20 0800)   norepinephrine (LEVOPHED) Adult infusion Stopped (07/23/20 2217)   prismasol BGK 2/2.5 replacement solution 500 mL/hr at 07/24/20 0843   prismasol BGK 2/2.5 replacement solution 300 mL/hr at 07/23/20 1726   prismasol BGK 4/2.5 2,500 mL/hr at 07/24/20 4599   remdesivir 100 mg in NS 100 mL Stopped (07/23/20 0952)   sodium bicarbonate 150 mEq in dextrose 5% 1000 mL 125 mL/hr at 07/24/20 0800   PRN Meds:.acetaminophen, fentaNYL, heparin, heparin, midazolam, ondansetron **OR** ondansetron (ZOFRAN) IV, sodium chloride, vecuronium  ABG    Component Value Date/Time   PHART 7.240 (L) 07/22/2020 1340   PCO2ART 51.6 (H) 07/22/2020 1340   PO2ART 179 (H) 07/22/2020 1340   HCO3 21.4 07/22/2020 1340   ACIDBASEDEF 5.9 (H) 07/22/2020 1340   O2SAT 99.1 07/22/2020  1340    A/P  1. 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     Donato Heinz, MD [facility] Pager: [phone] Office [phone]

## 2020-07-24 NOTE — Progress Notes (Signed)
Inpatient Diabetes Program Recommendations  AACE/ADA: New Consensus Statement on Inpatient Glycemic Control (2015)  Target Ranges:  Prepandial:   less than 140 mg/dL      Peak postprandial:   less than 180 mg/dL (1-2 hours)      Critically ill patients:  140 - 180 mg/dL   Lab Results  Component Value Date   GLUCAP 265 (H) 07/24/2020   HGBA1C 12.3 (H) 07/23/2020    Review of Glycemic Control  Diabetes history: None  Current orders for Inpatient glycemic control:  Novolog 0-20 units Q4h hours Novolog 5 units Q4 hours Tube Feed Coverage  Vital HP 45 ml/hour Solumedrol 80 mg Q8 hours Creat/BUN: 3.46/59  Inpatient Diabetes Program Recommendations:    - add back Levemir 10 units bid   Thanks, Tama Headings RN, MSN, BC-ADM Inpatient Diabetes Coordinator Team Pager [phone] (8a-5p)

## 2020-07-24 NOTE — Progress Notes (Addendum)
Istat ABG: 7.013/69.6/77/17.7/86% from [place]; Settings: PRVC 420/35/+5/100%. Dr. Lake Bells made aware. PEEP to +10 and Vt increased to 8cc - 560 mL.

## 2020-07-24 NOTE — Progress Notes (Signed)
[facility] P[facility]  CT abdomen/pelvis does not show clear cause of lactic acidosis CT head NAICP, non-specific white matter disease COOX> SvO2 normal  Shock continues to improve Troponin slightly elevated   Impression: Unclear cause of shock earlier today, sepsis? NSTEMI Continued circulatory shock ARDS AKI  Plan: Continue supportive care Add broad spectrum antibiotics incase this is sepsis Blood, resp cultures ordered Wean off levophed for MAP > 65 Heparin infusion Continue sodium bicarbonate infusion Monitor lactic acid value and abg Monitor troponin  Brother updated by phone, discussed code status, advised limited code including no CPR or shocks.  He agrees but wants to clarify this with his mother and siblings prior to having [place] write this order.  Additional cc time 30 minutes  [name], MD [place] [facility] Pager: [phone] Cell: [phone] If no response, call [phone]

## 2020-07-24 NOTE — Progress Notes (Signed)
ABG held until after CT per MD.

## 2020-07-24 NOTE — Progress Notes (Addendum)
Patient noted to have decreased HR/BP/Temp. VS as recorded. CRRT and Heparin stopped. Dr Lake Bells at bedside. 4 Amps Bicarb given, 1 AMP atropine, 2 AMP epi, Amio bolus, Vaso/Levo gtt started and 1 L LR given. Art line placed. Labs drawn and EKG done.

## 2020-07-24 NOTE — Progress Notes (Signed)
Pharmacy Antibiotic Note  Alex [name] is a 64 y.o. male admitted on 07/24/2020 with respiratory failure due to [place] went into shock today. Patient was previously on ceftriaxone. Pharmacy has been consulted for vancomycin and cefepime dosing.  Plan: Cefepime 2 g iv q 12 hours  Vancomycin 2000 mg iv once followed by 1250 mg iv q 24 hours  F/U renal function, clinical course  Height: 5\' 9"  (175.3 cm) Weight: 112.5 kg (248 lb) IBW/kg (Calculated) : 70.7  Temp (24hrs), Avg:97.6 F (36.4 C), Min:95.9 F (35.5 C), Max:100.2 F (37.9 C)  Recent Labs  Lab 08/05/2020 1248 07/30/2020 1600 07/24/2020 1624 07/21/20 0006 07/21/20 2100 07/21/20 2301 07/22/20 0350 07/22/20 0350 07/22/20 2100 07/23/20 0210 07/23/20 1600 07/24/20 0359 07/24/20 1130 07/24/20 1202  WBC 5.6  --   --    < >  --  6.2 6.6  --   --  9.9  --  12.1*  --  33.6*  CREATININE 7.90*   < >  --    < >   < >  --  7.13*   < > 3.89* 3.46*  3.44* 3.07* 2.57*  2.65*  --  2.88*  LATICACIDVEN 2.1*  --  1.3  --   --   --   --   --   --   --   --   --  8.4* >11.0*   < > = values in this interval not displayed.    Estimated Creatinine Clearance: 32.5 mL/min (A) (by C-G formula based on SCr of 2.88 mg/dL (H)).    No Known Allergies   Thank you for allowing pharmacy to be a part of this patient's care.  Ulice Dash D 07/24/2020 5:29 PM

## 2020-07-24 NOTE — TOC Progression Note (Signed)
Transition of Care [facility]) - Progression Note    Patient Details  Name: Alex [name] MRN: [id number] Date of Birth: Jun 27, 1956  [facility] CM/SW Contact  Leeroy Cha, RN Phone Number: 07/24/2020, 7:34 AM  Clinical Narrative:    Remains on the vent, iv solu-medrol, iv sedation, iv pressors,iv heparin, crrt ongoing.  Covid labs remain elevated, na 111, bun 47/creat 2.15.   Following for progression and toc needs.   Expected Discharge Plan: Home/Self Care Barriers to Discharge: Continued Medical Work up  Expected Discharge Plan and Services Expected Discharge Plan: Home/Self Care       Living arrangements for the past 2 months: Single Family Home                                       Social Determinants of Health (SDOH) Interventions    Readmission Risk Interventions No flowsheet data found.

## 2020-07-24 NOTE — Progress Notes (Signed)
ANTICOAGULATION CONSULT NOTE - Follow Up Consult  Pharmacy Consult for Heparin Indication: acute DVT  No Known Allergies  Patient Measurements: Height: 5' 9" (175.3 cm) Weight: 112.5 kg (248 lb) IBW/kg (Calculated) : 70.7 Heparin Dosing Weight: 95 kg  Vital Signs: Temp: 99 F (37.2 C) (08/05 1400) Temp Source: Bladder (08/05 0400) BP: 113/64 (08/05 1300) Pulse Rate: 72 (08/05 1400)  Labs: Recent Labs    07/21/20 2301 07/22/20 0350 07/23/20 0210 07/23/20 0210 07/23/20 0455 07/23/20 0455 07/23/20 1400 07/23/20 1600 07/23/20 2200 07/24/20 0359 07/24/20 0730 07/24/20 1202  HGB 13.1   < > 12.8*   < >  --   --   --   --   --  14.0  --  13.8  HCT 39.8   < > 38.9*  --   --   --   --   --   --  43.0  --  44.7  PLT 212   < > 218  --   --   --   --   --   --  174  --  238  APTT 31  --  >200*  --  >200*  --   --   --   --  58*  --   --   LABPROT 15.2  --   --   --   --   --   --   --   --   --   --   --   INR 1.3*  --   --   --   --   --   --   --   --   --   --   --   HEPARINUNFRC  --    < > 0.96*   < > 1.10*   < > 0.96*  --  0.72*  --  0.37  --   CREATININE  --    < > 3.46*  3.44*   < >  --   --   --  3.07*  --  2.57*  2.65*  --  2.88*  TROPONINIHS  --   --   --   --   --   --   --   --   --   --   --  53*   < > = values in this interval not displayed.    Estimated Creatinine Clearance: 32.5 mL/min (A) (by C-G formula based on SCr of 2.88 mg/dL (H)).   Medications:  Scheduled:  . iohexol      . vitamin C  500 mg Oral Daily  . chlorhexidine gluconate (MEDLINE KIT)  15 mL Mouth Rinse BID  . Chlorhexidine Gluconate Cloth  6 each Topical Daily  . docusate  100 mg Oral BID  . feeding supplement (PROSource TF)  90 mL Per Tube 5 X Daily  . insulin aspart  0-20 Units Subcutaneous Q4H  . insulin aspart  10 Units Subcutaneous Q4H  . iohexol  500 mL Oral Q1H  . mouth rinse  15 mL Mouth Rinse BID  . mouth rinse  15 mL Mouth Rinse 10 times per day  . methylPREDNISolone  (SOLU-MEDROL) injection  80 mg Intravenous Q12H  . multivitamin  1 tablet Per Tube QHS  . polyethylene glycol  17 g Oral Daily  . sodium chloride flush  3 mL Intravenous Q12H  . zinc sulfate  220 mg Oral Daily   Infusions:  . feeding supplement (VITAL HIGH PROTEIN) 45 mL/hr at  07/23/20 2200  . fentaNYL infusion INTRAVENOUS 10 mcg/hr (07/24/20 1315)  . heparin 10,000 units/ 20 mL infusion syringe 250 Units/hr (07/23/20 0706)  . heparin    . norepinephrine (LEVOPHED) Adult infusion 15 mcg/min (07/24/20 1315)  . potassium chloride    . prismasol BGK 2/2.5 replacement solution 500 mL/hr at 07/24/20 0843  . prismasol BGK 2/2.5 replacement solution 300 mL/hr at 07/24/20 1053  . prismasol BGK 4/2.5 2,500 mL/hr at 07/24/20 0942  . sodium bicarbonate 150 mEq in dextrose 5% 1000 mL 100 mL/hr at 07/24/20 1315  . vasopressin 0.03 Units/min (07/24/20 1315)   PRN: acetaminophen, fentaNYL, heparin, heparin, midazolam, midazolam, ondansetron **OR** ondansetron (ZOFRAN) IV, sodium chloride  Assessment: 64 yo male with COVID PNA, now on CRRT, D-dimer rose from 7 to 17 to start IV heparin per Rx dosing for acute R DVT peroneal vein.  07/24/20, AM  Heparin level 0.37 this AM, therapeutic. Note heparin 250 units/hr via CRRT circuit   CBC: Hg low 14, pltc WNL  No bleeding noted   issues per discussion with RN   Infusion was interrupted during the day as concern for hemorrhage Stopped for about two hours  Goal of Therapy:  Heparin level 0.3-0.7 units/ml Monitor platelets by anticoagulation protocol: Yes   Plan:   Resume IV heparin rate at 600 units/hr    Recheck heparin level in 8 hours  Daily heparin level and CBC while on heparin  Monitor for signs/symptoms of bleeding    Royetta Asal, PharmD, BCPS 07/24/2020 2:05 PM

## 2020-07-24 NOTE — Progress Notes (Signed)
eLink Physician-Brief Progress Note Patient Name: Alex [name] DOB: March 01, 1956 MRN: [id number]   Date of Service  07/24/2020  HPI/Events of Note  Bradycardia on higher doses of Fentanyl, patient also dropped his blood pressure at the onset of dialysis but it has recovered.  eICU Interventions  Start Propofol and wean down Fentanyl, Dopamine 2.5 mcg infusion for heart rate  <  45 bpm.        Cyniah Gossard U [name] 07/24/2020, 10:50 PM

## 2020-07-24 NOTE — Procedures (Signed)
Arterial Line Insertion Procedure Note MIKI BLANK [id number] [birth year]  Procedure: Insertion of Arterial Line Indications: Frequent blood sampling and blood pressure monitorin  Procedure Details Consent: Unable to obtain consent because of emergent medical necessity. Time Out: Verified patient identification, verified procedure, site/side was marked, verified correct patient position, special equipment/implants available, medications/allergies/relevent history reviewed, required imaging and test results available.  Performed  Maximum sterile technique was used including antiseptics, cap, gloves, gown, hand hygiene, mask and sheet. Skin prep: Chlorhexidine; local anesthetic administered A single lumen arterial catheter was placed in the R femoral artery using the Seldinger technique.    Ultrasound was used to verify the patency of the artery and for real time needle guidance.  Evaluation Blood flow good Complications: No apparent complications Patient did tolerate procedure well.  [name] 07/24/2020, 11:57 AM

## 2020-07-25 ENCOUNTER — Inpatient Hospital Stay (HOSPITAL_COMMUNITY): Payer: Medicaid Other

## 2020-07-25 LAB — COMPREHENSIVE METABOLIC PANEL
ALT: 116 U/L — ABNORMAL HIGH (ref 0–44)
AST: 184 U/L — ABNORMAL HIGH (ref 15–41)
Albumin: 1.7 g/dL — ABNORMAL LOW (ref 3.5–5.0)
Alkaline Phosphatase: 71 U/L (ref 38–126)
Anion gap: 12 (ref 5–15)
BUN: 61 mg/dL — ABNORMAL HIGH (ref 8–23)
CO2: 31 mmol/L (ref 22–32)
Calcium: 6.5 mg/dL — ABNORMAL LOW (ref 8.9–10.3)
Chloride: 92 mmol/L — ABNORMAL LOW (ref 98–111)
Creatinine, Ser: 2.85 mg/dL — ABNORMAL HIGH (ref 0.61–1.24)
GFR calc Af Amer: 26 mL/min — ABNORMAL LOW (ref >60)
GFR calc non Af Amer: 22 mL/min — ABNORMAL LOW (ref >60)
Glucose, Bld: 212 mg/dL — ABNORMAL HIGH (ref 70–99)
Potassium: 3.5 mmol/L (ref 3.5–5.1)
Sodium: 135 mmol/L (ref 135–145)
Total Bilirubin: 0.3 mg/dL (ref 0.3–1.2)
Total Protein: 4.7 g/dL — ABNORMAL LOW (ref 6.5–8.1)

## 2020-07-25 LAB — CBC
HCT: 36.3 % — ABNORMAL LOW (ref 39.0–52.0)
Hemoglobin: 11.9 g/dL — ABNORMAL LOW (ref 13.0–17.0)
MCH: 28.1 pg (ref 26.0–34.0)
MCHC: 32.8 g/dL (ref 30.0–36.0)
MCV: 85.8 fL (ref 80.0–100.0)
Platelets: 205 10*3/uL (ref 150–400)
RBC: 4.23 MIL/uL (ref 4.22–5.81)
RDW: 13.2 % (ref 11.5–15.5)
WBC: 19.2 10*3/uL — ABNORMAL HIGH (ref 4.0–10.5)
nRBC: 0.9 % — ABNORMAL HIGH (ref 0.0–0.2)

## 2020-07-25 LAB — BLOOD GAS, ARTERIAL
Acid-Base Excess: 11.1 mmol/L — ABNORMAL HIGH (ref 0.0–2.0)
Acid-Base Excess: 6.9 mmol/L — ABNORMAL HIGH (ref 0.0–2.0)
Bicarbonate: 34.5 mmol/L — ABNORMAL HIGH (ref 20.0–28.0)
Bicarbonate: 29.7 mmol/L — ABNORMAL HIGH (ref 20.0–28.0)
Drawn by: 441261
FIO2: 50
FIO2: 50
MECHVT: 560 mL
MECHVT: 560 mL
O2 Saturation: 86.2 %
O2 Saturation: 94.6 %
PEEP: 10 cmH2O
PEEP: 14 cmH2O
Patient temperature: 98.6
Patient temperature: 36.1
RATE: 35 resp/min
RATE: 30 resp/min
pCO2 arterial: 39.6 mmHg (ref 32.0–48.0)
pCO2 arterial: 34.4 mmHg (ref 32.0–48.0)
pH, Arterial: 7.549 — ABNORMAL HIGH (ref 7.350–7.450)
pH, Arterial: 7.541 — ABNORMAL HIGH (ref 7.350–7.450)
pO2, Arterial: 49.8 mmHg — ABNORMAL LOW (ref 83.0–108.0)
pO2, Arterial: 63.8 mmHg — ABNORMAL LOW (ref 83.0–108.0)

## 2020-07-25 LAB — CBC WITH DIFFERENTIAL/PLATELET
Abs Immature Granulocytes: 0.51 10*3/uL — ABNORMAL HIGH (ref 0.00–0.07)
Basophils Absolute: 0 10*3/uL (ref 0.0–0.1)
Basophils Relative: 0 %
Eosinophils Absolute: 0 10*3/uL (ref 0.0–0.5)
Eosinophils Relative: 0 %
HCT: 35.2 % — ABNORMAL LOW (ref 39.0–52.0)
Hemoglobin: 12.1 g/dL — ABNORMAL LOW (ref 13.0–17.0)
Immature Granulocytes: 3 %
Lymphocytes Relative: 10 %
Lymphs Abs: 1.6 10*3/uL (ref 0.7–4.0)
MCH: 28.5 pg (ref 26.0–34.0)
MCHC: 34.4 g/dL (ref 30.0–36.0)
MCV: 82.8 fL (ref 80.0–100.0)
Monocytes Absolute: 0.6 10*3/uL (ref 0.1–1.0)
Monocytes Relative: 4 %
Neutro Abs: 12.8 10*3/uL — ABNORMAL HIGH (ref 1.7–7.7)
Neutrophils Relative %: 83 %
Platelets: 157 10*3/uL (ref 150–400)
RBC: 4.25 MIL/uL (ref 4.22–5.81)
RDW: 13.2 % (ref 11.5–15.5)
WBC: 15.6 10*3/uL — ABNORMAL HIGH (ref 4.0–10.5)
nRBC: 0.4 % — ABNORMAL HIGH (ref 0.0–0.2)

## 2020-07-25 LAB — GLUCOSE, CAPILLARY
Glucose-Capillary: 157 mg/dL — ABNORMAL HIGH (ref 70–99)
Glucose-Capillary: 174 mg/dL — ABNORMAL HIGH (ref 70–99)
Glucose-Capillary: 194 mg/dL — ABNORMAL HIGH (ref 70–99)
Glucose-Capillary: 159 mg/dL — ABNORMAL HIGH (ref 70–99)
Glucose-Capillary: 157 mg/dL — ABNORMAL HIGH (ref 70–99)
Glucose-Capillary: 171 mg/dL — ABNORMAL HIGH (ref 70–99)
Glucose-Capillary: 178 mg/dL — ABNORMAL HIGH (ref 70–99)

## 2020-07-25 LAB — RENAL FUNCTION PANEL
Albumin: 1.7 g/dL — ABNORMAL LOW (ref 3.5–5.0)
Albumin: 1.9 g/dL — ABNORMAL LOW (ref 3.5–5.0)
Anion gap: 13 (ref 5–15)
Anion gap: 11 (ref 5–15)
BUN: 60 mg/dL — ABNORMAL HIGH (ref 8–23)
BUN: 72 mg/dL — ABNORMAL HIGH (ref 8–23)
CO2: 30 mmol/L (ref 22–32)
CO2: 32 mmol/L (ref 22–32)
Calcium: 6.5 mg/dL — ABNORMAL LOW (ref 8.9–10.3)
Calcium: 6.9 mg/dL — ABNORMAL LOW (ref 8.9–10.3)
Chloride: 92 mmol/L — ABNORMAL LOW (ref 98–111)
Chloride: 97 mmol/L — ABNORMAL LOW (ref 98–111)
Creatinine, Ser: 2.71 mg/dL — ABNORMAL HIGH (ref 0.61–1.24)
Creatinine, Ser: 3.11 mg/dL — ABNORMAL HIGH (ref 0.61–1.24)
GFR calc Af Amer: 28 mL/min — ABNORMAL LOW (ref >60)
GFR calc Af Amer: 23 mL/min — ABNORMAL LOW (ref >60)
GFR calc non Af Amer: 24 mL/min — ABNORMAL LOW (ref >60)
GFR calc non Af Amer: 20 mL/min — ABNORMAL LOW (ref >60)
Glucose, Bld: 216 mg/dL — ABNORMAL HIGH (ref 70–99)
Glucose, Bld: 153 mg/dL — ABNORMAL HIGH (ref 70–99)
Phosphorus: 2.6 mg/dL (ref 2.5–4.6)
Phosphorus: 4.9 mg/dL — ABNORMAL HIGH (ref 2.5–4.6)
Potassium: 3.5 mmol/L (ref 3.5–5.1)
Potassium: 3.9 mmol/L (ref 3.5–5.1)
Sodium: 135 mmol/L (ref 135–145)
Sodium: 140 mmol/L (ref 135–145)

## 2020-07-25 LAB — MAGNESIUM: Magnesium: 2.3 mg/dL (ref 1.7–2.4)

## 2020-07-25 LAB — PROTIME-INR
INR: 1.2 (ref 0.8–1.2)
Prothrombin Time: 14.2 seconds (ref 11.4–15.2)

## 2020-07-25 LAB — APTT
aPTT: 59 seconds — ABNORMAL HIGH (ref 24–36)
aPTT: 29 seconds (ref 24–36)

## 2020-07-25 LAB — CULTURE, BLOOD (ROUTINE X 2)
Culture: NO GROWTH
Culture: NO GROWTH

## 2020-07-25 LAB — HEPARIN LEVEL (UNFRACTIONATED)
Heparin Unfractionated: 0.28 IU/mL — ABNORMAL LOW (ref 0.30–0.70)
Heparin Unfractionated: 0.1 IU/mL — ABNORMAL LOW (ref 0.30–0.70)

## 2020-07-25 LAB — C-REACTIVE PROTEIN: CRP: 2.2 mg/dL — ABNORMAL HIGH (ref <1.0)

## 2020-07-25 LAB — FERRITIN: Ferritin: 204 ng/mL (ref 24–336)

## 2020-07-25 LAB — LACTIC ACID, PLASMA: Lactic Acid, Venous: 2.6 mmol/L (ref 0.5–1.9)

## 2020-07-25 LAB — PHOSPHORUS: Phosphorus: 2.5 mg/dL (ref 2.5–4.6)

## 2020-07-25 LAB — D-DIMER, QUANTITATIVE: D-Dimer, Quant: 9.3 ug/mL-FEU — ABNORMAL HIGH (ref 0.00–0.50)

## 2020-07-25 LAB — TRIGLYCERIDES: Triglycerides: 74 mg/dL (ref <150)

## 2020-07-25 MED ORDER — ZINC SULFATE 220 (50 ZN) MG PO CAPS
220.0000 mg | ORAL_CAPSULE | Freq: Every day | ORAL | Status: DC
  Administered 2020-07-26 – 2020-08-08 (×14): 220 mg
  Filled 2020-07-25 (×15): qty 1

## 2020-07-25 MED ORDER — POLYETHYLENE GLYCOL 3350 17 G PO PACK
17.0000 g | PACK | Freq: Every day | ORAL | Status: DC
  Administered 2020-07-26 – 2020-08-08 (×11): 17 g
  Filled 2020-07-25 (×9): qty 1

## 2020-07-25 MED ORDER — PIVOT 1.5 CAL PO LIQD
1000.0000 mL | ORAL | Status: DC
  Administered 2020-07-25 – 2020-07-28 (×3): 1000 mL
  Filled 2020-07-25 (×5): qty 1000

## 2020-07-25 MED ORDER — EPINEPHRINE 0.1 MG/10ML (10 MCG/ML) SYRINGE FOR IV PUSH (FOR BLOOD PRESSURE SUPPORT)
5.0000 ug | PREFILLED_SYRINGE | Freq: Once | INTRAVENOUS | Status: DC | PRN
  Filled 2020-07-25: qty 10

## 2020-07-25 MED ORDER — ALTEPLASE (PULMONARY EMBOLISM) INFUSION
50.0000 mg | Freq: Once | INTRAVENOUS | Status: AC
  Administered 2020-07-25: 50 mg via INTRAVENOUS
  Filled 2020-07-25: qty 50

## 2020-07-25 MED ORDER — PRISMASOL BGK 4/2.5 32-4-2.5 MEQ/L REPLACEMENT SOLN: Status: DC

## 2020-07-25 MED ORDER — ATROPINE SULFATE 1 MG/10ML IJ SOSY
PREFILLED_SYRINGE | INTRAMUSCULAR | Status: AC
  Administered 2020-07-25: 0.5 mg via INTRAVENOUS
  Filled 2020-07-25: qty 10

## 2020-07-25 MED ORDER — DOCUSATE SODIUM 50 MG/5ML PO LIQD
100.0000 mg | Freq: Two times a day (BID) | ORAL | Status: DC
  Administered 2020-07-26 – 2020-08-10 (×24): 100 mg
  Filled 2020-07-25 (×25): qty 10

## 2020-07-25 MED ORDER — SODIUM CHLORIDE 0.9 % IV SOLN: 250.0000 mL | Freq: Once | INTRAVENOUS | Status: DC

## 2020-07-25 MED ORDER — ACETAMINOPHEN 325 MG PO TABS
650.0000 mg | ORAL_TABLET | Freq: Four times a day (QID) | ORAL | Status: DC | PRN
  Administered 2020-07-26: 650 mg
  Filled 2020-07-25: qty 2

## 2020-07-25 MED ORDER — SENNOSIDES 8.8 MG/5ML PO SYRP
10.0000 mL | ORAL_SOLUTION | Freq: Every day | ORAL | Status: DC | PRN
  Administered 2020-07-25 – 2020-08-03 (×4): 10 mL
  Filled 2020-07-25 (×5): qty 10

## 2020-07-25 MED ORDER — ATROPINE SULFATE 1 MG/10ML IJ SOSY: 0.5000 mg | PREFILLED_SYRINGE | Freq: Once | INTRAMUSCULAR | Status: AC

## 2020-07-25 MED ORDER — ALTEPLASE (PULMONARY EMBOLISM) INFUSION
50.0000 mg | Freq: Once | INTRAVENOUS | Status: DC
  Filled 2020-07-25: qty 50

## 2020-07-25 MED ORDER — ASCORBIC ACID 500 MG PO TABS
500.0000 mg | ORAL_TABLET | Freq: Every day | ORAL | Status: DC
  Administered 2020-07-26 – 2020-08-08 (×14): 500 mg
  Filled 2020-07-25 (×15): qty 1

## 2020-07-25 MED ORDER — PREDNISONE 20 MG PO TABS
50.0000 mg | ORAL_TABLET | Freq: Every day | ORAL | Status: DC
  Administered 2020-07-26 – 2020-07-29 (×4): 50 mg via ORAL
  Filled 2020-07-25 (×4): qty 2

## 2020-07-25 MED ORDER — HEPARIN (PORCINE) 25000 UT/250ML-% IV SOLN
950.0000 [IU]/h | INTRAVENOUS | Status: DC
  Administered 2020-07-25: 950 [IU]/h via INTRAVENOUS

## 2020-07-25 MED ORDER — HEPARIN (PORCINE) 25000 UT/250ML-% IV SOLN: 950.0000 [IU]/h | INTRAVENOUS | Status: DC

## 2020-07-25 MED ORDER — SODIUM CHLORIDE 0.9 % IV SOLN
250.0000 mL | Freq: Once | INTRAVENOUS | Status: AC
  Administered 2020-07-25: 250 mL via INTRAVENOUS

## 2020-07-25 NOTE — Procedures (Signed)
Admit: 07/31/2020 LOS: 5  Had significant change in hemodynamics and acidemia yesterday but improving today.    Current CRRT Prescription: Start Date: 07/21/20 Catheter: 07/21/20 LIJ trialysis cath BFR: 300 ml/hr Pre Blood Pump: 500 ml/hr DFR: 2500 ml/hr Replacement Rate: 300 ml/hr Goal UF: 50-100 ml/hr for CVP of 4 mm Hg Anticoagulation:  heparin Clotting:  Yes but likely related to central line    S: More hemodynamically stable for now but has had recurring episodes of bradycardia which have continued to worsen in severity.  O: 08/05 0701 - 08/06 0700 In: 3595.7 [I.V.:2584.1; NG/GT:285; IV Piggyback:726.6] Out: 1021 [Urine:67]  Filed Weights   07/22/20 0324 07/23/20 0500 07/24/20 0500  Weight: 112.5 kg 113.4 kg 112.5 kg    Recent Labs  Lab 07/24/20 0359 07/24/20 0359 07/24/20 1202 07/24/20 1217 07/24/20 1223 07/24/20 1600 07/25/20 0212  NA 133*  133*   < > 138   < > 139 134* 135  135  K 4.4  4.3   < > 2.8*   < > 2.7* 4.2 3.5  3.5  CL 97*  96*   < > 96*   < > 97* 94* 92*  92*  CO2 26  25   < > 16*  --   --  25 31  30   GLUCOSE 252*  246*   < > 337*   < > 333* 302* 212*  216*  BUN 47*  47*   < > 42*   < > 41* 57* 61*  60*  CREATININE 2.57*  2.65*   < > 2.88*   < > 2.80* 3.01* 2.85*  2.71*  CALCIUM 7.4*  7.5*   < > 7.2*  --   --  6.7* 6.5*  6.5*  PHOS 3.5  3.6  --   --   --   --  2.8 2.5  2.6   < > = values in this interval not displayed.   Recent Labs  Lab 07/23/20 0210 07/23/20 0210 07/24/20 0359 07/24/20 0359 07/24/20 1202 07/24/20 1202 07/24/20 1217 07/24/20 1223 07/25/20 0212  WBC 9.9   < > 12.1*  --  33.6*  --   --   --  15.6*  NEUTROABS 8.6*  --  10.4*  --   --   --   --   --  12.8*  HGB 12.8*   < > 14.0   < > 13.8   < > 15.0 16.3 12.1*  HCT 38.9*   < > 43.0   < > 44.7   < > 44.0 48.0 35.2*  MCV 86.6   < > 87.6  --  91.8  --   --   --  82.8  PLT 218   < > 174  --  238  --   --   --  157   < > = values in this interval not displayed.     Scheduled Meds: . [START ON 07/26/2020] vitamin C  500 mg Per Tube Daily  . chlorhexidine gluconate (MEDLINE KIT)  15 mL Mouth Rinse BID  . Chlorhexidine Gluconate Cloth  6 each Topical Daily  . docusate  100 mg Per Tube BID  . feeding supplement (PROSource TF)  90 mL Per Tube 5 X Daily  . insulin aspart  0-20 Units Subcutaneous Q4H  . insulin aspart  10 Units Subcutaneous Q4H  . mouth rinse  15 mL Mouth Rinse 10 times per day  . methylPREDNISolone (SOLU-MEDROL) injection  80 mg Intravenous Q12H  .  multivitamin  1 tablet Per Tube QHS  . [START ON 07/26/2020] polyethylene glycol  17 g Per Tube Daily  . sodium chloride flush  3 mL Intravenous Q12H  . [START ON 07/26/2020] zinc sulfate  220 mg Per Tube Daily   Continuous Infusions: .  prismasol BGK 4/2.5 500 mL/hr at 07/25/20 1053  .  prismasol BGK 4/2.5 300 mL/hr at 07/25/20 1053  . ceFEPime (MAXIPIME) IV Stopped (07/25/20 0557)  . DOPamine Stopped (07/25/20 0453)  . feeding supplement (VITAL HIGH PROTEIN) 45 mL/hr at 07/23/20 2200  . fentaNYL infusion INTRAVENOUS 125 mcg/hr (07/25/20 1000)  . heparin 10,000 units/ 20 mL infusion syringe 250 Units/hr (07/25/20 1020)  . heparin 700 Units/hr (07/25/20 0328)  . norepinephrine (LEVOPHED) Adult infusion 5 mcg/min (07/25/20 1000)  . prismasol BGK 4/2.5 2,500 mL/hr at 07/25/20 0529  . propofol (DIPRIVAN) infusion 20 mcg/kg/min (07/25/20 1000)  . vancomycin    . vasopressin Stopped (07/24/20 2304)   PRN Meds:.acetaminophen, fentaNYL, heparin, heparin, midazolam, midazolam, ondansetron **OR** ondansetron (ZOFRAN) IV, sennosides, sodium chloride  ABG    Component Value Date/Time   PHART 7.549 (H) 07/25/2020 0901   PCO2ART 39.6 07/25/2020 0901   PO2ART 49.8 (L) 07/25/2020 0901   HCO3 34.5 (H) 07/25/2020 0901   TCO2 17 (L) 07/24/2020 1223   ACIDBASEDEF 15.0 (H) 07/24/2020 1217   O2SAT 86.2 07/25/2020 0901    A/P  1. 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      Donato Heinz, MD [place] Pager: [phone] Office [phone]

## 2020-07-25 NOTE — Progress Notes (Signed)
eLink Physician-Brief Progress Note Patient Name: Alex [name] DOB: 03-May-1956 MRN: [id number]   Date of Service  07/25/2020  HPI/Events of Note  Patient is on the ventilator and needs bilateral wrist restraints to prevent self-extubation.  eICU Interventions  Bilateral wrist restraints ordered.        Kerry Kass [place] 07/25/2020, 5:05 AM

## 2020-07-25 NOTE — Progress Notes (Signed)
eLink Physician-Brief Progress Note Patient Name: Alex [name] DOB: 06/26/1956 MRN: [id number]   Date of Service  07/25/2020  HPI/Events of Note  Ventilator dyssynchrony.  eICU Interventions  Ceiling on Fentanyl increased to 300 mcg.        Kerry Kass [place] 07/25/2020, 9:58 PM

## 2020-07-25 NOTE — Progress Notes (Addendum)
[facility]  Called back to bedside due to another episode of bradycardia and hypotension which happened without apparent cause. No change on ventialtor at the time, no suctioning previously.    Bedside echo performed, showed RV dilation, confirmed this finding by speaking with cardiology on call who reviewed images with me.  At this point the overwhelming evidence of cause for his shock points to pulmonary embolism as he has a known DVT and our work up for other causes of shock (sepsis, bleeding) have been unrevealing.  I explained to his brother Herbie [place] that there is some diagnostic uncertainty in the the absence of a CT angiogram, but the risk of further kidney injury or cardiac arrest with transport if we were to perform that test is very high.    Given the repeated episodes of shock and cardiac instability, I believe that the best approach is to treat him with TPA now.  I explained to Herbie [place] (brother) that the risk of bleeding is high with TPA, but the chances of cardiac arrest is higher.  After discussion of the risks and benefits he is willing to proceed with TPA administration.   Will return blood to CRRT machine, then give 50U TPA after  Additional cc time 1 hour  [name], MD [place] [facility] Pager: [phone] Cell: [phone] If no response, call [phone]

## 2020-07-25 NOTE — Progress Notes (Signed)
Brief Pharmacy Anti-Coagulation Note:  Heparin held for administration of TPA Pharmacy consulted to resume heparin drip when aPTT < 80 APTT 29 Start heparin drip at 950 units/hr (previous rate) Heparin level in 8 hours  Dolly Rias RPh 07/25/2020, 8:31 PM

## 2020-07-25 NOTE — Progress Notes (Signed)
[facility]  Planning to give 50 U TPA over 2 hours Hold CRRT for 2 hours after When restart CRRT, keep net even overnight  [name], MD [place] [facility] Pager: [phone] Cell: [phone] If no response, call [phone]

## 2020-07-25 NOTE — Progress Notes (Signed)
Patient ID: Alex [name], male   DOB: Feb 09, 1956, 64 y.o.   MRN: [id number] S: Pt became hemodynamically unstable yesterday with bradycardia (again) and hypotension.  He also had a significant drop in bicarb and pH.  His lactate level increased and CT scan of abd to r/o ischemic bowel was performed without any significant findings.  He was started back on levophed, vasopressin and dopamine were added as well as CRRT replacement fluids changed to isotonic bicarb.   O:BP 123/62   Pulse 67   Temp 99.7 F (37.6 C)   Resp (!) 35   Ht 5' 9"  (1.753 m)   Wt 112.5 kg   SpO2 100%   BMI 36.62 kg/m   Intake/Output Summary (Last 24 hours) at 07/25/2020 1050 Last data filed at 07/25/2020 0900 Gross per 24 hour  Intake 4681.39 ml  Output 193 ml  Net 4488.39 ml   Intake/Output: I/O last 3 completed shifts: In: 6097 [I.V.:4495.4; NG/GT:875; IV Piggyback:726.6] Out: 4496 [Urine:67; Other:4579]  Intake/Output this shift:  Total I/O In: 1746 [I.V.:932.7; NG/GT:715.5; IV Piggyback:97.8] Out: 0  Weight change:  Gen: intubated and sedated Physical exam: unable to complete due to COVID + status.  In order to preserve PPE equipment and to minimize exposure to providers.  Notes from other caregivers reviewed   Recent Labs  Lab 08/17/2020 1600 07/21/20 0006 07/21/20 0307 07/21/20 2100 07/22/20 0350 07/22/20 0350 07/22/20 2100 07/22/20 2100 07/23/20 0210 07/23/20 0210 07/23/20 1600 07/24/20 0359 07/24/20 1202 07/24/20 1217 07/24/20 1223 07/24/20 1600 07/25/20 0212  NA 135   < > 136   < > 136   < > 138   < > 136  137   < > 136 133*  133* 138 135 139 134* 135  135  K 5.2*   < > 5.8*   < > 4.9   < > 4.1   < > 4.2  4.3   < > 4.3 4.4  4.3 2.8* 2.8* 2.7* 4.2 3.5  3.5  CL 96*   < > 101   < > 100   < > 103   < > 99  100  --  97* 97*  96* 96*  --  97* 94* 92*  92*  CO2 17*   < > 19*   < > 20*   < > 23  --  27  25  --  26 26  25  16*  --   --  25 31  30   GLUCOSE 182*   < > 260*   < > 276*   < >  239*   < > 255*  256*  --  275* 252*  246* 337*  --  333* 302* 212*  216*  BUN 99*   < > 120*   < > 113*   < > 65*   < > 59*  59*  --  48* 47*  47* 42*  --  41* 57* 61*  60*  CREATININE 8.12*   < > 8.43*   < > 7.13*   < > 3.89*   < > 3.46*  3.44*  --  3.07* 2.57*  2.65* 2.88*  --  2.80* 3.01* 2.85*  2.71*  ALBUMIN 2.5*  --  2.1*   < > 2.1*   < > 1.9*  --  2.0*  2.0*  --  2.1* 2.2*  2.3* 1.9*  --   --  1.8* 1.7*  1.7*  CALCIUM 8.5*   < > 7.4*   < >  7.1*   < > 6.5*  --  6.9*  6.9*  --  7.0* 7.4*  7.5* 7.2*  --   --  6.7* 6.5*  6.5*  PHOS  --   --  7.9*   < > 8.1*  --  4.8*  4.9*  --  4.7*  4.9*  --  4.3 3.5  3.6  --   --   --  2.8 2.5  2.6  AST 108*  --  83*  --  49*  --   --   --  55*  --   --  85* 163*  --   --   --  184*  ALT 48*  --  40  --  30  --   --   --  28  --   --  38 91*  --   --   --  116*   < > = values in this interval not displayed.   Liver Function Tests: Recent Labs  Lab 07/24/20 0359 07/24/20 0359 07/24/20 1202 07/24/20 1600 07/25/20 0212  AST 85*  --  163*  --  184*  ALT 38  --  91*  --  116*  ALKPHOS 78  --  85  --  71  BILITOT 0.3  --  0.5  --  0.3  PROT 6.0*  --  5.2*  --  4.7*  ALBUMIN 2.2*  2.3*   < > 1.9* 1.8* 1.7*  1.7*   < > = values in this interval not displayed.   No results for input(s): LIPASE, AMYLASE in the last 168 hours. No results for input(s): AMMONIA in the last 168 hours. CBC: Recent Labs  Lab 07/22/20 0350 07/22/20 0350 07/23/20 0210 07/23/20 0210 07/24/20 0359 07/24/20 0359 07/24/20 1202 07/24/20 1202 07/24/20 1217 07/24/20 1223 07/25/20 0212  WBC 6.6   < > 9.9   < > 12.1*  --  33.6*  --   --   --  15.6*  NEUTROABS 5.8   < > 8.6*  --  10.4*  --   --   --   --   --  12.8*  HGB 13.8   < > 12.8*   < > 14.0   < > 13.8   < > 15.0 16.3 12.1*  HCT 42.8   < > 38.9*   < > 43.0   < > 44.7   < > 44.0 48.0 35.2*  MCV 87.0  --  86.6  --  87.6  --  91.8  --   --   --  82.8  PLT 242   < > 218   < > 174  --  238  --   --    --  157   < > = values in this interval not displayed.   Cardiac Enzymes: Recent Labs  Lab 07/24/20 1202  CKTOTAL 2,124*  CKMB 14.0*   CBG: Recent Labs  Lab 07/24/20 1945 07/24/20 2322 07/25/20 0317 07/25/20 0627 07/25/20 0742  GLUCAP 293* 243* 157* 174* 194*    Iron Studies:  Recent Labs    07/25/20 0212  FERRITIN 204   Studies/Results: CT ABDOMEN PELVIS WO CONTRAST  Result Date: 07/24/2020 CLINICAL DATA:  Sepsis, hypoxia, COVID pneumonia EXAM: CT ABDOMEN AND PELVIS WITHOUT CONTRAST TECHNIQUE: Multidetector CT imaging of the abdomen and pelvis was performed following the standard protocol without IV contrast. Oral enteric contrast was administered. COMPARISON:  None. FINDINGS: Lower chest: Cardiomegaly. Very dense heterogeneous airspace opacity  and consolidation of the dependent bilateral lung bases, with diffuse ground-glass opacity in the remaining aerated portions of the lungs. Hepatobiliary: No solid liver abnormality is seen. No gallstones, gallbladder wall thickening, or biliary dilatation. Pancreas: Unremarkable. No pancreatic ductal dilatation or surrounding inflammatory changes. Spleen: Normal in size without significant abnormality. Adrenals/Urinary Tract: Adrenal glands are unremarkable. Kidneys are normal, without renal calculi, solid lesion, or hydronephrosis. The urinary bladder is decompressed by Foley catheter. Stomach/Bowel: Stomach is within normal limits. Esophagogastric tube with tip and side port in the gastric body. Appendix appears normal. No evidence of bowel wall thickening, distention, or inflammatory changes. Sigmoid diverticulosis. Vascular/Lymphatic: Aortic atherosclerosis. No enlarged abdominal or pelvic lymph nodes. Reproductive: No mass or other significant abnormality. Other: No abdominal wall hernia or abnormality. No abdominopelvic ascites. There is retroperitoneal soft tissue stranding overlying the aortic bifurcation and bilateral common iliac  vessels (series 2, image 73). Musculoskeletal: No acute or significant osseous findings. IMPRESSION: 1. No definite noncontrast CT findings of the abdomen or pelvis to explain sepsis. 2. There is retroperitoneal soft tissue stranding overlying the aortic bifurcation and bilateral common iliac vessels, of uncertain nature or significance, without evidence of mass, lymphadenopathy, or inflammatory findings specific to an abdominal or pelvic organ. 3. Very dense heterogeneous airspace opacity and consolidation of the dependent bilateral lung bases, with diffuse ground-glass opacity in the remaining aerated portions of the lungs. Findings are consistent with multifocal infection and COVID-19 airspace disease. 4. Aortic Atherosclerosis (ICD10-I70.0). Electronically Signed   By: Eddie Candle M.D.   On: 07/24/2020 16:51   CT HEAD WO CONTRAST  Result Date: 07/24/2020 CLINICAL DATA:  Mental status change, CNS infection suspected. Additional history provided: Sepsis, acute hypoxic respiratory failure due to COVID-19 pneumonia. EXAM: CT HEAD WITHOUT CONTRAST TECHNIQUE: Contiguous axial images were obtained from the base of the skull through the vertex without intravenous contrast. COMPARISON:  No pertinent prior exams are available for comparison. FINDINGS: Brain: Cerebral volume is normal. Moderate patchy hypoattenuation within the cerebral white matter is nonspecific, but most commonly seen on the basis of chronic small vessel ischemia. There is no acute intracranial hemorrhage. No demarcated cortical infarct is identified. No extra-axial fluid collection. No evidence of intracranial mass. No midline shift. Vascular: No hyperdense vessel.  Atherosclerotic calcifications. Skull: Normal. Negative for fracture or focal lesion. Sinuses/Orbits: Presumed chronic newly displaced deformities of the lamina papyracea bilaterally. Mild ethmoid sinus mucosal thickening. Left mastoid effusion. IMPRESSION: No CT evidence of acute  intracranial abnormality. Moderate patchy hypoattenuation within the cerebral white matter is nonspecific, but most commonly seen on the basis of chronic small vessel ischemia. Mild ethmoid sinus mucosal thickening. Left mastoid effusion. Electronically Signed   By: Kellie Simmering DO   On: 07/24/2020 16:25   DG Chest Port 1 View  Result Date: 07/25/2020 CLINICAL DATA:  COVID.  Respiratory distress. EXAM: PORTABLE CHEST 1 VIEW COMPARISON:  Chest x-ray 07/24/2020. FINDINGS: Endotracheal tube, NG tube, bilateral IJ lines stable position. Stable cardiomegaly. Diffuse severe bilateral pulmonary infiltrates/edema again noted. Similar findings noted on prior study. Small left pleural effusion cannot be excluded. No pneumothorax. IMPRESSION: 1.  Lines and tubes in stable position. 2.  Stable cardiomegaly. 3. Diffuse severe bilateral pulmonary infiltrates/edema again noted. Interim progression from prior exam. Small left pleural effusion cannot be excluded. Electronically Signed   By: Marcello Moores  Register   On: 07/25/2020 06:29   DG CHEST PORT 1 VIEW  Result Date: 07/24/2020 CLINICAL DATA:  COVID-19 positivity with shock EXAM: PORTABLE CHEST 1  VIEW COMPARISON:  07/24/2020 FINDINGS: Endotracheal tube, gastric catheter and bilateral jugular catheters are again seen and stable. Cardiac shadow is prominent accentuated by the frontal technique. Vascular congestion with parenchymal opacity is noted similar to that seen on the prior exam likely representing edema. No bony abnormality is noted. IMPRESSION: Persistent bilateral opacity likely representing edema. Electronically Signed   By: Inez Catalina M.D.   On: 07/24/2020 11:48   DG Chest Port 1 View  Result Date: 07/24/2020 CLINICAL DATA:  Respiratory failure. EXAM: PORTABLE CHEST 1 VIEW COMPARISON:  07/23/2020. FINDINGS: Endotracheal tube, NG tube, bilateral IJ lines in stable position. Stable cardiomegaly. Diffuse unchanged bilateral pulmonary infiltrates/edema. Small  bilateral pleural effusions cannot be excluded. No pneumothorax. IMPRESSION: 1.  Lines and tubes in stable position. 2.  Cardiomegaly, unchanged from prior exam. 3. Diffuse bilateral pulmonary infiltrates/edema again noted without interim change. Small bilateral pleural effusions again cannot be excluded. Chest appears unchanged from prior exam. Electronically Signed   By: Marcello Moores  Register   On: 07/24/2020 06:55   ECHOCARDIOGRAM COMPLETE  Result Date: 07/23/2020    ECHOCARDIOGRAM REPORT   Patient Name:   Christiaan BRIT WERNETTE Date of Exam: 07/23/2020 Medical Rec #:  [id number]    Height:       69.0 in Accession #:    [id number]   Weight:       250.0 lb Date of Birth:  [birth year]    BSA:          2.272 m Patient Age:    33 years     BP:           117/65 mmHg Patient Gender: M            HR:           42 bpm. Exam Location:  Inpatient Procedure: 2D Echo, Cardiac Doppler, Color Doppler and Intracardiac            Opacification Agent Indications:    Dyspnea 786.09 / R06.00  History:        Patient has no prior history of Echocardiogram examinations.                 COVID - 19 Positive.  Sonographer:    Jonelle Sidle Dance Referring Phys: [place] Comments: Echo performed with patient supine and on artificial respirator and patient is morbidly obese. IMPRESSIONS  1. Left ventricular ejection fraction, by estimation, is 60 to 65%. The left ventricle has normal function. The left ventricle has no regional wall motion abnormalities. There is mild left ventricular hypertrophy of the basal-septal segment. Left ventricular diastolic parameters were normal.  2. Right ventricular systolic function is normal. The right ventricular size is normal. There is normal pulmonary artery systolic pressure.  3. The mitral valve is normal in structure. No evidence of mitral valve regurgitation. No evidence of mitral stenosis.  4. The aortic valve is normal in structure. Aortic valve regurgitation is not visualized. No aortic  stenosis is present.  5. The inferior vena cava is normal in size with greater than 50% respiratory variability, suggesting right atrial pressure of 3 mmHg. FINDINGS  Left Ventricle: Left ventricular ejection fraction, by estimation, is 60 to 65%. The left ventricle has normal function. The left ventricle has no regional wall motion abnormalities. Definity contrast agent was given IV to delineate the left ventricular  endocardial borders. The left ventricular internal cavity size was normal in size. There is mild left ventricular hypertrophy of the basal-septal segment.  Left ventricular diastolic parameters were normal. Normal left ventricular filling pressure. Right Ventricle: The right ventricular size is normal. No increase in right ventricular wall thickness. Right ventricular systolic function is normal. There is normal pulmonary artery systolic pressure. The tricuspid regurgitant velocity is 2.27 m/s, and  with an assumed right atrial pressure of 8 mmHg, the estimated right ventricular systolic pressure is 63.0 mmHg. Left Atrium: Left atrial size was normal in size. Right Atrium: Right atrial size was normal in size. Pericardium: There is no evidence of pericardial effusion. Mitral Valve: The mitral valve is normal in structure. Normal mobility of the mitral valve leaflets. No evidence of mitral valve regurgitation. No evidence of mitral valve stenosis. Tricuspid Valve: The tricuspid valve is normal in structure. Tricuspid valve regurgitation is mild . No evidence of tricuspid stenosis. Aortic Valve: The aortic valve is normal in structure. Aortic valve regurgitation is not visualized. No aortic stenosis is present. Pulmonic Valve: The pulmonic valve was normal in structure. Pulmonic valve regurgitation is not visualized. No evidence of pulmonic stenosis. Aorta: The aortic root is normal in size and structure. Venous: The inferior vena cava is normal in size with greater than 50% respiratory variability,  suggesting right atrial pressure of 3 mmHg. IAS/Shunts: No atrial level shunt detected by color flow Doppler.  LEFT VENTRICLE PLAX 2D LVIDd:         4.60 cm  Diastology LVIDs:         3.10 cm  LV e' lateral:   7.94 cm/s LV PW:         1.10 cm  LV E/e' lateral: 8.4 LV IVS:        1.40 cm  LV e' medial:    6.96 cm/s LVOT diam:     2.40 cm  LV E/e' medial:  9.6 LV SV:         111 LV SV Index:   49 LVOT Area:     4.52 cm  IVC IVC diam: 2.30 cm LEFT ATRIUM           Index       RIGHT ATRIUM           Index LA diam:      3.60 cm 1.58 cm/m  RA Area:     16.00 cm LA Vol (A2C): 69.7 ml 30.68 ml/m RA Volume:   40.10 ml  17.65 ml/m LA Vol (A4C): 42.1 ml 18.53 ml/m  AORTIC VALVE LVOT Vmax:   125.00 cm/s LVOT Vmean:  80.000 cm/s LVOT VTI:    0.246 m  AORTA Ao Root diam: 3.70 cm Ao Asc diam:  3.50 cm MITRAL VALVE               TRICUSPID VALVE MV Area (PHT): 2.83 cm    TR Peak grad:   20.6 mmHg MV Decel Time: 268 msec    TR Vmax:        227.00 cm/s MV E velocity: 66.80 cm/s MV A velocity: 50.80 cm/s  SHUNTS MV E/A ratio:  1.31        Systemic VTI:  0.25 m                            Systemic Diam: 2.40 cm Dani Gobble Croitoru MD Electronically signed by Sanda Klein MD Signature Date/Time: 07/23/2020/5:41:18 PM    Final    . Derrill Memo ON 07/26/2020] vitamin C  500 mg Per Tube Daily  . chlorhexidine gluconate (MEDLINE  KIT)  15 mL Mouth Rinse BID  . Chlorhexidine Gluconate Cloth  6 each Topical Daily  . docusate  100 mg Per Tube BID  . feeding supplement (PROSource TF)  90 mL Per Tube 5 X Daily  . insulin aspart  0-20 Units Subcutaneous Q4H  . insulin aspart  10 Units Subcutaneous Q4H  . mouth rinse  15 mL Mouth Rinse 10 times per day  . methylPREDNISolone (SOLU-MEDROL) injection  80 mg Intravenous Q12H  . multivitamin  1 tablet Per Tube QHS  . [START ON 07/26/2020] polyethylene glycol  17 g Per Tube Daily  . sodium chloride flush  3 mL Intravenous Q12H  . [START ON 07/26/2020] zinc sulfate  220 mg Per Tube Daily    BMET     Component Value Date/Time   NA 135 07/25/2020 0212   NA 135 07/25/2020 0212   K 3.5 07/25/2020 0212   K 3.5 07/25/2020 0212   CL 92 (L) 07/25/2020 0212   CL 92 (L) 07/25/2020 0212   CO2 31 07/25/2020 0212   CO2 30 07/25/2020 0212   GLUCOSE 212 (H) 07/25/2020 0212   GLUCOSE 216 (H) 07/25/2020 0212   BUN 61 (H) 07/25/2020 0212   BUN 60 (H) 07/25/2020 0212   CREATININE 2.85 (H) 07/25/2020 0212   CREATININE 2.71 (H) 07/25/2020 0212   CALCIUM 6.5 (L) 07/25/2020 0212   CALCIUM 6.5 (L) 07/25/2020 0212   GFRNONAA 22 (L) 07/25/2020 0212   GFRNONAA 24 (L) 07/25/2020 0212   GFRAA 26 (L) 07/25/2020 0212   GFRAA 28 (L) 07/25/2020 0212   CBC    Component Value Date/Time   WBC 15.6 (H) 07/25/2020 0212   RBC 4.25 07/25/2020 0212   HGB 12.1 (L) 07/25/2020 0212   HCT 35.2 (L) 07/25/2020 0212   PLT 157 07/25/2020 0212   MCV 82.8 07/25/2020 0212   MCH 28.5 07/25/2020 0212   MCHC 34.4 07/25/2020 0212   RDW 13.2 07/25/2020 0212   LYMPHSABS 1.6 07/25/2020 0212   MONOABS 0.6 07/25/2020 0212   EOSABS 0.0 07/25/2020 0212   BASOSABS 0.0 07/25/2020 0212     Assessment/Plan: 1. Recurring bradycardia with worsening shock- has been progressive and recurring since admission.  Unclear cause of acute worsening of acidemia/hypotension.  Improving at present. 2. OliguricAKI vs progressive CKD - in setting of covid pneumonia and concomitant ARBuse. Unclearwhat stage of CKD existed prior to this presentation as there is no baseline Scr known. He has poor medical literacy and insight. I discussed the severity of his kidney disease and the potential need for dialysis, however he did not grasp the seriousness of his condition and just wantedto leave.  1. HD catheter placed 07/21/20 and initiated CRRT  2. Systemic anticoagulation as well as intra-circuit heparin. 3. Renal USconsistent with chronic medical renal disease. 4. Off ofmetformin and losartan 5. Goal CVP of <4 mmHg per PCCM 6. Continue  with CRRT 7. Clotted system likely due to issues with line and may require new HD catheter if it continues to interfere with dialysis. 3. Acute hypoxic respiratory failure with ARDS due to covid pneumonia.s/pintubation 07/21/20. 4. Covid pneumonia- unvaccinated smoker. He has been treated with decadron, rocephin, remdesivir per PCCM 5. Acute right leg DVT- on systemic heparin infusion. 6. Hyperkalemia- due to #1.Improved with CRRT. 7. Metabolic acidosis- due to #1. Improving with CRRT.  Can stop IV bicarb if ok with PCCM. 8. Proteinuria- likely due to underlying diabetic nephropathy, however we have seen worsening of proteinuria with covid-19  infection. 9. Disposition-unfortunately was not able to speak with Palliative care before he was intubated.  Donetta Potts, MD [employer] [phone]

## 2020-07-25 NOTE — Progress Notes (Signed)
ANTICOAGULATION CONSULT NOTE - Follow Up Consult  Pharmacy Consult for Heparin Indication: acute DVT  No Known Allergies  Patient Measurements: Height: _0  (175.3 cm) Weight: 112.5 kg (248 lb) IBW/kg (Calculated) : 70.7 Heparin Dosing Weight: 95 kg  Vital Signs: Temp: 97.3 F (36.3 C) (08/06 1245) Temp Source: Bladder (08/06 0800) BP: 123/62 (08/06 0846) Pulse Rate: 58 (08/06 1245)  Labs: Recent Labs    07/23/20 0455 07/23/20 1400 07/23/20 2200 07/24/20 0359 07/24/20 0359 07/24/20 0730 07/24/20 1202 07/24/20 1202 07/24/20 1217 07/24/20 1217 07/24/20 1223 07/24/20 1316 07/24/20 1600 07/25/20 0212 07/25/20 1200  HGB  --   --   --  14.0   < >  --  13.8   < > 15.0   < > 16.3  --   --  12.1*  --   HCT  --   --   --  43.0   < >  --  44.7   < > 44.0  --  48.0  --   --  35.2*  --   PLT  --   --   --  174  --   --  238  --   --   --   --   --   --  157  --   APTT >200*  --   --  58*  --   --   --   --   --   --   --   --   --  59*  --   HEPARINUNFRC 1.10*   < >   < >  --   --  0.37  --   --   --   --   --   --   --  0.28* 0.10*  CREATININE  --    < >  --  2.57*  2.65*   < >  --  2.88*  --   --    < > 2.80*  --  3.01* 2.85*  2.71*  --   CKTOTAL  --   --   --   --   --   --  2,124*  --   --   --   --   --   --   --   --   CKMB  --   --   --   --   --   --  14.0*  --   --   --   --   --   --   --   --   TROPONINIHS  --   --   --   --   --   --  53*  --   --   --   --  134*  --   --   --    < > = values in this interval not displayed.    Estimated Creatinine Clearance: 32.8 mL/min (A) (by C-G formula based on SCr of 2.85 mg/dL (H)).   Medications:  Scheduled:  . [START ON 07/26/2020] vitamin C  500 mg Per Tube Daily  . chlorhexidine gluconate (MEDLINE KIT)  15 mL Mouth Rinse BID  . Chlorhexidine Gluconate Cloth  6 each Topical Daily  . docusate  100 mg Per Tube BID  . feeding supplement (PROSource TF)  90 mL Per Tube 5 X Daily  . insulin aspart  0-20 Units  Subcutaneous Q4H  . insulin aspart  10 Units Subcutaneous Q4H  . mouth rinse  15 mL  Mouth Rinse 10 times per day  . [START ON 07/26/2020] polyethylene glycol  17 g Per Tube Daily  . [START ON 07/26/2020] predniSONE  50 mg Oral Q breakfast  . sodium chloride flush  3 mL Intravenous Q12H  . [START ON 07/26/2020] zinc sulfate  220 mg Per Tube Daily   Infusions:  .  prismasol BGK 4/2.5 500 mL/hr at 07/25/20 1053  .  prismasol BGK 4/2.5 300 mL/hr at 07/25/20 1053  . ceFEPime (MAXIPIME) IV Stopped (07/25/20 0557)  . DOPamine 2.5 mcg/kg/min (07/25/20 1157)  . feeding supplement (PIVOT 1.5 CAL)    . fentaNYL infusion INTRAVENOUS 125 mcg/hr (07/25/20 1301)  . heparin 10,000 units/ 20 mL infusion syringe 250 Units/hr (07/25/20 1200)  . heparin 700 Units/hr (07/25/20 0328)  . norepinephrine (LEVOPHED) Adult infusion Stopped (07/25/20 1218)  . prismasol BGK 4/2.5 2,500 mL/hr at 07/25/20 0529  . propofol (DIPRIVAN) infusion 20 mcg/kg/min (07/25/20 1300)  . vancomycin    . vasopressin Stopped (07/24/20 2304)   PRN: acetaminophen, fentaNYL, heparin, heparin, midazolam, midazolam, ondansetron **OR** ondansetron (ZOFRAN) IV, sennosides, sodium chloride  Assessment: 64 yo male with COVID PNA, now on CRRT, D-dimer rose from 7 to 17 to start IV heparin per Rx dosing for acute R DVT peroneal vein.  07/25/20, AM  Heparin level SUBtherapeutic and decreased despite increase in IV heparin rate from 600 to 700 units/hr early this AM.   Note heparin 250 units/hr via CRRT circuit   CBC: Hg low 12.1, pltc WNL  No bleeding noted or infusion issues per discussion with RN   Goal of Therapy:  Heparin level 0.3-0.7 units/ml Monitor platelets by anticoagulation protocol: Yes   Plan:   Increase IV heparin rate from 700 units/hr to 950 units/hr  Recheck heparin level in 8 hours  Daily heparin level and CBC while on heparin  Monitor for signs/symptoms of bleeding   Adrian Saran, PharmD, BCPS 07/25/2020  1:03 PM

## 2020-07-25 NOTE — Progress Notes (Signed)
[facility] Progress Note Patient Name: Alex [name] DOB: 26-Feb-1956 MRN: [id number]   Date of Service  07/25/2020  HPI/Events of Note  Elevated blood pressure, he is off Norepinephrine, he is on 2.5 mcg / kg / min of Dopamine.  eICU Interventions  Bedside RN instructed to wean Dopamine as tolerated for MAP goal of 65 mmHg and heart rate goal of > 50 bpm.        Chamia Schmutz U Aiana Nordquist 07/25/2020, 9:12 PM

## 2020-07-25 NOTE — Progress Notes (Signed)
ANTICOAGULATION CONSULT NOTE - Follow Up Consult  Pharmacy Consult for Heparin Indication: acute DVT  No Known Allergies  Patient Measurements: Height: 5' 9"  (175.3 cm) Weight: 112.5 kg (248 lb) IBW/kg (Calculated) : 70.7 Heparin Dosing Weight: 95 kg  Vital Signs: Temp: 97.2 F (36.2 C) (08/06 0249) Temp Source: Bladder (08/06 0000) BP: 121/66 (08/06 0249) Pulse Rate: 50 (08/06 0249)  Labs: Recent Labs    07/23/20 0210 07/23/20 0210 07/23/20 0455 07/23/20 1400 07/23/20 2200 07/24/20 0359 07/24/20 0359 07/24/20 0730 07/24/20 1202 07/24/20 1202 07/24/20 1217 07/24/20 1217 07/24/20 1223 07/24/20 1316 07/24/20 1600 07/25/20 0212  HGB 12.8*   < >  --   --   --  14.0   < >  --  13.8   < > 15.0   < > 16.3  --   --  12.1*  HCT 38.9*   < >  --   --   --  43.0   < >  --  44.7   < > 44.0  --  48.0  --   --  35.2*  PLT 218   < >  --   --   --  174  --   --  238  --   --   --   --   --   --  157  APTT >200*  --  >200*  --   --  58*  --   --   --   --   --   --   --   --   --   --   HEPARINUNFRC 0.96*   < > 1.10*   < > 0.72*  --   --  0.37  --   --   --   --   --   --   --  0.28*  CREATININE 3.46*  3.44*   < >  --    < >  --  2.57*  2.65*   < >  --  2.88*  --   --    < > 2.80*  --  3.01* 2.85*  2.71*  CKTOTAL  --   --   --   --   --   --   --   --  2,124*  --   --   --   --   --   --   --   CKMB  --   --   --   --   --   --   --   --  14.0*  --   --   --   --   --   --   --   TROPONINIHS  --   --   --   --   --   --   --   --  53*  --   --   --   --  134*  --   --    < > = values in this interval not displayed.    Estimated Creatinine Clearance: 32.8 mL/min (A) (by C-G formula based on SCr of 2.85 mg/dL (H)).   Medications:  Scheduled:  . vitamin C  500 mg Oral Daily  . chlorhexidine gluconate (MEDLINE KIT)  15 mL Mouth Rinse BID  . Chlorhexidine Gluconate Cloth  6 each Topical Daily  . docusate  100 mg Oral BID  . feeding supplement (PROSource TF)  90 mL Per Tube 5 X  Daily  . insulin aspart  0-20 Units Subcutaneous Q4H  . insulin aspart  10 Units Subcutaneous Q4H  . mouth rinse  15 mL Mouth Rinse BID  . mouth rinse  15 mL Mouth Rinse 10 times per day  . methylPREDNISolone (SOLU-MEDROL) injection  80 mg Intravenous Q12H  . multivitamin  1 tablet Per Tube QHS  . polyethylene glycol  17 g Oral Daily  . sodium chloride flush  3 mL Intravenous Q12H  . zinc sulfate  220 mg Oral Daily   Infusions:  . ceFEPime (MAXIPIME) IV Stopped (07/24/20 1928)  . DOPamine    . feeding supplement (VITAL HIGH PROTEIN) 45 mL/hr at 07/23/20 2200  . fentaNYL infusion INTRAVENOUS 100 mcg/hr (07/25/20 0200)  . heparin 10,000 units/ 20 mL infusion syringe 250 Units/hr (07/23/20 0706)  . heparin 600 Units/hr (07/24/20 1846)  . norepinephrine (LEVOPHED) Adult infusion 2 mcg/min (07/25/20 0200)  . prismasol BGK 4/2.5 2,500 mL/hr at 07/25/20 0121  . propofol (DIPRIVAN) infusion 10 mcg/kg/min (07/25/20 0200)  . sodium bicarbonate 150 mEq in dextrose 5% 1000 mL 100 mL/hr at 07/25/20 0200  . sodium bicarbonate (isotonic) 1000 mL infusion 500 mL/hr at 07/24/20 2258  . sodium bicarbonate (isotonic) 1000 mL infusion 300 mL/hr at 07/25/20 0239  . vancomycin    . vasopressin Stopped (07/24/20 2304)   PRN: acetaminophen, fentaNYL, heparin, heparin, midazolam, midazolam, ondansetron **OR** ondansetron (ZOFRAN) IV, sodium chloride  Assessment: 64 yo male with COVID PNA, now on CRRT, D-dimer rose from 7 to 17 to start IV heparin per Rx dosing for acute R DVT peroneal vein.  07/25/20, AM  Heparin level 0.28 after infusion resumed, SUBtherapeutic. Note heparin 250 units/hr via CRRT circuit   CBC: Hg low 12.1, pltc WNL  No bleeding noted or infusion issues per discussion with RN   Goal of Therapy:  Heparin level 0.3-0.7 units/ml Monitor platelets by anticoagulation protocol: Yes   Plan:   Increase IV heparin rate to 700 units/hr    Recheck heparin level in 8 hours  Daily  heparin level and CBC while on heparin  Monitor for signs/symptoms of bleeding   [name], PharmD, BCPS 07/25/2020 3:21 AM

## 2020-07-25 NOTE — Progress Notes (Signed)
eLink Physician-Brief Progress Note Patient Name: Alex [name] DOB: 05-02-1956 MRN: [id number]   Date of Service  07/25/2020  HPI/Events of Note  Patient dropped his heart rate down to 38  Bpm, he received 0.5 mg of Atropine and became tachycardic and hypertensive, propfol sedation was increased and Norepinephrine and Dopamine were held, blood pressure and heart rate then drifted down to systolic in the 91'A and MAP in the low 60's, Norepinephrine was restarted at low dose.  eICU Interventions  Plan is to manage a recurrence of bradycardia with demand external pacing  at 50 bpm in order to avoid wild rhythm and hemodynamic swings.        Kerry Kass Davina Howlett 07/25/2020, 11:38 PM

## 2020-07-25 NOTE — Progress Notes (Signed)
Nutrition Follow-up  DOCUMENTATION CODES:   Obesity unspecified  INTERVENTION:  - will adjust TF regimen with decrease in propofol rate. - Pivot 1.5 @ 45 ml/hr with 90 ml Prosource TF x5/day (10 packets/day). - this regimen + kcal from current propofol rate will provide 2376 kcal (107% estimated kcal need), 211 grams protein, and 820 ml free water.  - will d/c rena-vit. - free water flush, if desired, to be per CCM.  NUTRITION DIAGNOSIS:   Increased nutrient needs related to acute illness, catabolic illness (PHXTA-56 infection) as evidenced by estimated needs. -ongoing  GOAL:   Provide needs based on ASPEN/SCCM guidelines -met with TF regimen  MONITOR:   Vent status, TF tolerance, Labs, Weight trends, I & O's  ASSESSMENT:   64 year old male who quit smoking 2 weeks PTA. He presented to the ED due to 3 day hx of fever, feeling unwell, generalized weakness, and SOB. He family brought him in due to poor responsiveness with O2 of 45% in the ED. CXR showed evidence of bilateral interstitial and alveolar infiltrates. Patient is unvaccinated against COVID-19 and was diagnosed with COVID-19 this admission.  Significant Events: 8/2- admission; intubation; OGT placement; TF initiation 8/3- CRRT started early morning; initial RD assessment (NFPE completed)  Patient remains intubated in supine position with OGT in place. He is receiving TF at rate: Vital High Protein @ 45 ml/hr with 90 ml Prosource TF x5/day (10 packets/day) and no free water flush. This regimen provides 1480 kcal, 204 grams protein, and 903 ml free water.   Re-estimated kcal need based on adjusted body weight of 87.2 kg.   Spoke with RN who reports patient is tolerating TF regimen without issue. Patient remains on CRRT. No plan for proning at this time.    Patient is currently intubated on ventilator support MV: 16.4 L/min Temp (24hrs), Avg:98.4 F (36.9 C), Min:95.9 F (35.5 C), Max:100.2 F (37.9 C) Propofol:  13.5 ml/hr (356 kcal)   Labs reviewed; CBGs: 157, 174, 194 mg/dl, Cl: 92 mmol/l, BUN: 61 mg/dl, creatinine: 2.85 mg/dl, Ca: 6.5 mg/dl, LFTs elevated, GFR: 26 ml/min. Medications reviewed; 500 mg ascorbic acid/day, 100 mg colace BID, sliding scale novolog, 10 units novolog every 4 hours, 1 tablet rena-vit/day, 17 g miralax/day, 50 mg deltasone/day, 10 mEq IV KCl x5 runs 8/5, 220 mg zinc sulfate/day.  Drips; propofol @ 20 mcg/kg/min, fentanyl @ 125 mcg/hr, levo @ 5 mcg/min,   Diet Order:   Diet Order            Diet NPO time specified  Diet effective now                 EDUCATION NEEDS:   No education needs have been identified at this time  Skin:  Skin Assessment: Reviewed RN Assessment  Last BM:  8/2  Height:   Ht Readings from Last 1 Encounters:  07/24/20 _0  (1.753 m)    Weight:   Wt Readings from Last 1 Encounters:  07/24/20 112.5 kg     Estimated Nutritional Needs:  Kcal:  2221 kcal (PSU with AjBW) Protein:  >/= 202 grams (1.8 grams/kg actual weight) Fluid:  >/= 2.2 L/day     Jarome Matin, MS, RD, LDN, CNSC Inpatient Clinical Dietitian RD pager # available in [facility]  After hours/weekend pager # available in [facility]

## 2020-07-25 NOTE — Progress Notes (Signed)
NAME:  Alex [name], MRN:  [id number], DOB:  03-May-1956, LOS: 5 ADMISSION DATE:  07/29/2020, CONSULTATION DATE:  8/1 REFERRING MD:  Sedonia Small, CHIEF COMPLAINT:  Dyspnea   Brief History   64 y/o male with acute hypoxemic respiratory failure due to COVID 19 pneumonia admitted on 8/1  Past Medical History  Cigarette smoker DM2 Hypertension CKD?  Significant Hospital Events   8/1 admission 8/2 intubation, start CRRT 8/5 significant improvement in oxygenation; mid morning developed severe sudden shock of uncertain etiology, low grade temp  Consults:  Nephrology  Procedures:  8/2 ETT >  8/2 L IJ CVL >  8/3 R IJ HD cath >   Significant Diagnostic Tests:  8/2 lower ext doppler/vascular ultrasound >> acute R DVT peroneal vein, left negative 8/2 renal ultrasound >> no hydro, findings consistent with medicorenal disease  Micro Data:  8/1 SARS COV 2 > positive 8/1 blood >   8/5 blood >  8/5 resp >   Antimicrobials/COVID Rx:  8/1 ceftriaxone > 8/5 8/1 remdesivir >  8/1 tocilizumab   Interim history/subjective:   Loletha Grayer again Some agitation, required restraints Brief dopapine Vasopressin off No bowel movement   Objective   Blood pressure 121/66, pulse 71, temperature 98.8 F (37.1 C), resp. rate (!) 35, height 5\' 9"  (1.753 m), weight 112.5 kg, SpO2 100 %. CVP:  [4 mmHg-10 mmHg] 4 mmHg  Vent Mode: PRVC FiO2 (%):  [30 %-100 %] 70 % Set Rate:  [35 bmp] 35 bmp Vt Set:  [420 mL-560 mL] 560 mL PEEP:  [5 cmH20-10 cmH20] 10 cmH20 Plateau Pressure:  [24 cmH20-34 cmH20] 28 cmH20   Intake/Output Summary (Last 24 hours) at 07/25/2020 0725 Last data filed at 07/25/2020 0400 Gross per 24 hour  Intake 3595.69 ml  Output 1021 ml  Net 2574.69 ml   Filed Weights   07/22/20 0324 07/23/20 0500 07/24/20 0500  Weight: 112.5 kg 113.4 kg 112.5 kg    Examination:  General:  In bed on vent HENT: NCAT ETT in place PULM: CTA B, vent supported breathing CV: RRR, no mgr GI: BS+, soft,  nontender MSK: normal bulk and tone Neuro: sedated on vent  8/5 CXR images personally reviewed> rotated image, ett/lines in place, bibasilar airspace disease  Resolved Hospital Problem list     Assessment & Plan:  ARDS due to COVID 19 pneumonia > worsening oxygenation on 8/6 Picture worrisome for HCAP Acute pulmonary edema due to acute renal failure Repeat ABG 1400 F/u culture sent yesterday Continue broad spectrum antibiotics Increase PEEP to 14 Decrease RR 30 Continue ARDS/lung protective vent strategy Hold prone with bradycardia episodes Change solumedrol to prednisone 50mg  daily  Severe shock midmorning 8/5, periodic bradycardia> no clear source, possible septic shock?; labs not consistent with primary cardiac event; PE? Continue antibiotics Wean off levophed for MAP > 65 Dopamine on hold Continue heparin infusion Bedside echo   Acute DVT R leg Continue heparin infusion  AKI with hyperkalemia BPH Metabolic acidosis 8/6 Monitor BMET and UOP Replace electrolytes as needed Stop bicarbonate infuson Continue CRRT  Hyperglycemia Continue tube feeding coverage, levemir, SSI  Need for sedation for mechanical ventilation RASS target: -1 Continue versed prn, fentanyl infusion per PAD protocol   Best practice:  Diet: regular Pain/Anxiety/Delirium protocol (if indicated): n/a VAP protocol (if indicated): n/a DVT prophylaxis: heparin infusion GI prophylaxis: n/a Glucose control: SSI Mobility: bed rest Code Status: full Family Communication: I called his brother Herbie [place] on 8/6 for an update Disposition: remain in ICU  Labs  CBC: Recent Labs  Lab 07/21/20 0307 07/21/20 2301 07/22/20 0350 07/22/20 0350 07/23/20 0210 07/23/20 0210 07/24/20 0359 07/24/20 1202 07/24/20 1217 07/24/20 1223 07/25/20 0212  WBC 2.8*   < > 6.6  --  9.9  --  12.1* 33.6*  --   --  15.6*  NEUTROABS 2.1  --  5.8  --  8.6*  --  10.4*  --   --   --  12.8*  HGB 13.8   < > 13.8   < >  12.8*   < > 14.0 13.8 15.0 16.3 12.1*  HCT 42.5   < > 42.8   < > 38.9*   < > 43.0 44.7 44.0 48.0 35.2*  MCV 87.1   < > 87.0  --  86.6  --  87.6 91.8  --   --  82.8  PLT 185   < > 242  --  218  --  174 238  --   --  157   < > = values in this interval not displayed.    Basic Metabolic Panel: Recent Labs  Lab 07/22/20 0350 07/22/20 0350 07/22/20 2100 07/22/20 2100 07/23/20 0210 07/23/20 0210 07/23/20 1600 07/23/20 1600 07/24/20 0359 07/24/20 0359 07/24/20 1202 07/24/20 1217 07/24/20 1223 07/24/20 1600 07/25/20 0212  NA 136   < > 138   < > 136  137   < > 136   < > 133*  133*   < > 138 135 139 134* 135  135  K 4.9   < > 4.1   < > 4.2  4.3   < > 4.3   < > 4.4  4.3   < > 2.8* 2.8* 2.7* 4.2 3.5  3.5  CL 100   < > 103   < > 99  100   < > 97*   < > 97*  96*  --  96*  --  97* 94* 92*  92*  CO2 20*   < > 23   < > 27  25   < > 26  --  26  25  --  16*  --   --  25 31  30   GLUCOSE 276*   < > 239*   < > 255*  256*   < > 275*   < > 252*  246*  --  337*  --  333* 302* 212*  216*  BUN 113*   < > 65*   < > 59*  59*   < > 48*   < > 47*  47*  --  42*  --  41* 57* 61*  60*  CREATININE 7.13*   < > 3.89*   < > 3.46*  3.44*   < > 3.07*   < > 2.57*  2.65*  --  2.88*  --  2.80* 3.01* 2.85*  2.71*  CALCIUM 7.1*   < > 6.5*   < > 6.9*  6.9*   < > 7.0*  --  7.4*  7.5*  --  7.2*  --   --  6.7* 6.5*  6.5*  MG 2.5*  --  2.3  --  2.4  --   --   --  2.6*  --   --   --   --   --  2.3  PHOS 8.1*   < > 4.8*  4.9*   < > 4.7*  4.9*  --  4.3  --  3.5  3.6  --   --   --   --  2.8 2.5  2.6   < > = values in this interval not displayed.   GFR: Estimated Creatinine Clearance: 32.8 mL/min (A) (by C-G formula based on SCr of 2.85 mg/dL (H)). Recent Labs  Lab 08/03/2020 1248 08/06/2020 1248 08/08/2020 1624 07/21/20 0307 07/23/20 0210 07/24/20 0359 07/24/20 1130 07/24/20 1202 07/24/20 1928 07/25/20 0212  PROCALCITON 4.75  --   --   --   --   --   --   --   --   --   WBC 5.6  --   --    < >  9.9 12.1*  --  33.6*  --  15.6*  LATICACIDVEN 2.1*   < > 1.3  --   --   --  8.4* >11.0* 3.6*  --    < > = values in this interval not displayed.    Liver Function Tests: Recent Labs  Lab 07/22/20 0350 07/22/20 2100 07/23/20 0210 07/23/20 0210 07/23/20 1600 07/24/20 0359 07/24/20 1202 07/24/20 1600 07/25/20 0212  AST 49*  --  55*  --   --  85* 163*  --  184*  ALT 30  --  28  --   --  38 91*  --  116*  ALKPHOS 65  --  64  --   --  78 85  --  71  BILITOT 0.5  --  0.4  --   --  0.3 0.5  --  0.3  PROT 5.7*  --  5.4*  --   --  6.0* 5.2*  --  4.7*  ALBUMIN 2.1*   < > 2.0*  2.0*   < > 2.1* 2.2*  2.3* 1.9* 1.8* 1.7*  1.7*   < > = values in this interval not displayed.   No results for input(s): LIPASE, AMYLASE in the last 168 hours. No results for input(s): AMMONIA in the last 168 hours.  ABG    Component Value Date/Time   PHART 7.375 07/24/2020 1600   PCO2ART 46.1 07/24/2020 1600   PO2ART 66.1 (L) 07/24/2020 1600   HCO3 26.4 07/24/2020 1600   TCO2 17 (L) 07/24/2020 1223   ACIDBASEDEF 15.0 (H) 07/24/2020 1217   O2SAT 91.8 07/24/2020 1600     Coagulation Profile: Recent Labs  Lab 07/21/20 2301  INR 1.3*    Cardiac Enzymes: Recent Labs  Lab 07/24/20 1202  CKTOTAL 2,124*  CKMB 14.0*    HbA1C: Hgb A1c MFr Bld  Date/Time Value Ref Range Status  07/23/2020 09:36 AM 12.3 (H) 4.8 - 5.6 % Final    Comment:    (NOTE) Pre diabetes:          5.7%-6.4%  Diabetes:              >6.4%  Glycemic control for   <7.0% adults with diabetes     CBG: Recent Labs  Lab 07/24/20 1648 07/24/20 1945 07/24/20 2322 07/25/20 0317 07/25/20 0627  GLUCAP 276* 293* 243* 157* 174*     Critical care time: 40 minutes    [name], MD [place] [facility] Pager: [phone] Cell: [phone] If no response, call [phone]

## 2020-07-26 ENCOUNTER — Inpatient Hospital Stay (HOSPITAL_COMMUNITY): Payer: Medicaid Other

## 2020-07-26 DIAGNOSIS — I2609 Other pulmonary embolism with acute cor pulmonale: Secondary | ICD-10-CM

## 2020-07-26 DIAGNOSIS — Z9911 Dependence on respirator [ventilator] status: Secondary | ICD-10-CM

## 2020-07-26 DIAGNOSIS — J80 Acute respiratory distress syndrome: Secondary | ICD-10-CM

## 2020-07-26 LAB — GLUCOSE, CAPILLARY
Glucose-Capillary: 192 mg/dL — ABNORMAL HIGH (ref 70–99)
Glucose-Capillary: 203 mg/dL — ABNORMAL HIGH (ref 70–99)
Glucose-Capillary: 227 mg/dL — ABNORMAL HIGH (ref 70–99)
Glucose-Capillary: 252 mg/dL — ABNORMAL HIGH (ref 70–99)
Glucose-Capillary: 262 mg/dL — ABNORMAL HIGH (ref 70–99)
Glucose-Capillary: 262 mg/dL — ABNORMAL HIGH (ref 70–99)

## 2020-07-26 LAB — RENAL FUNCTION PANEL
Albumin: 2 g/dL — ABNORMAL LOW (ref 3.5–5.0)
Albumin: 1.9 g/dL — ABNORMAL LOW (ref 3.5–5.0)
Albumin: 1.9 g/dL — ABNORMAL LOW (ref 3.5–5.0)
Anion gap: 16 — ABNORMAL HIGH (ref 5–15)
Anion gap: 19 — ABNORMAL HIGH (ref 5–15)
Anion gap: 17 — ABNORMAL HIGH (ref 5–15)
BUN: 85 mg/dL — ABNORMAL HIGH (ref 8–23)
BUN: 95 mg/dL — ABNORMAL HIGH (ref 8–23)
BUN: 109 mg/dL — ABNORMAL HIGH (ref 8–23)
CO2: 27 mmol/L (ref 22–32)
CO2: 25 mmol/L (ref 22–32)
CO2: 25 mmol/L (ref 22–32)
Calcium: 7.3 mg/dL — ABNORMAL LOW (ref 8.9–10.3)
Calcium: 7.3 mg/dL — ABNORMAL LOW (ref 8.9–10.3)
Calcium: 7.3 mg/dL — ABNORMAL LOW (ref 8.9–10.3)
Chloride: 94 mmol/L — ABNORMAL LOW (ref 98–111)
Chloride: 92 mmol/L — ABNORMAL LOW (ref 98–111)
Chloride: 92 mmol/L — ABNORMAL LOW (ref 98–111)
Creatinine, Ser: 3.83 mg/dL — ABNORMAL HIGH (ref 0.61–1.24)
Creatinine, Ser: 4.42 mg/dL — ABNORMAL HIGH (ref 0.61–1.24)
Creatinine, Ser: 5.03 mg/dL — ABNORMAL HIGH (ref 0.61–1.24)
GFR calc Af Amer: 18 mL/min — ABNORMAL LOW (ref >60)
GFR calc Af Amer: 15 mL/min — ABNORMAL LOW (ref >60)
GFR calc Af Amer: 13 mL/min — ABNORMAL LOW (ref >60)
GFR calc non Af Amer: 16 mL/min — ABNORMAL LOW (ref >60)
GFR calc non Af Amer: 13 mL/min — ABNORMAL LOW (ref >60)
GFR calc non Af Amer: 11 mL/min — ABNORMAL LOW (ref >60)
Glucose, Bld: 205 mg/dL — ABNORMAL HIGH (ref 70–99)
Glucose, Bld: 217 mg/dL — ABNORMAL HIGH (ref 70–99)
Glucose, Bld: 308 mg/dL — ABNORMAL HIGH (ref 70–99)
Phosphorus: 6 mg/dL — ABNORMAL HIGH (ref 2.5–4.6)
Phosphorus: 5.4 mg/dL — ABNORMAL HIGH (ref 2.5–4.6)
Phosphorus: 6.2 mg/dL — ABNORMAL HIGH (ref 2.5–4.6)
Potassium: 4.5 mmol/L (ref 3.5–5.1)
Potassium: 3.9 mmol/L (ref 3.5–5.1)
Potassium: 4.5 mmol/L (ref 3.5–5.1)
Sodium: 137 mmol/L (ref 135–145)
Sodium: 136 mmol/L (ref 135–145)
Sodium: 134 mmol/L — ABNORMAL LOW (ref 135–145)

## 2020-07-26 LAB — CBC WITH DIFFERENTIAL/PLATELET
Abs Immature Granulocytes: 0.96 10*3/uL — ABNORMAL HIGH (ref 0.00–0.07)
Basophils Absolute: 0.1 10*3/uL (ref 0.0–0.1)
Basophils Relative: 0 %
Eosinophils Absolute: 0 10*3/uL (ref 0.0–0.5)
Eosinophils Relative: 0 %
HCT: 36.1 % — ABNORMAL LOW (ref 39.0–52.0)
Hemoglobin: 11.9 g/dL — ABNORMAL LOW (ref 13.0–17.0)
Immature Granulocytes: 5 %
Lymphocytes Relative: 10 %
Lymphs Abs: 2.2 10*3/uL (ref 0.7–4.0)
MCH: 28.5 pg (ref 26.0–34.0)
MCHC: 33 g/dL (ref 30.0–36.0)
MCV: 86.6 fL (ref 80.0–100.0)
Monocytes Absolute: 1.2 10*3/uL — ABNORMAL HIGH (ref 0.1–1.0)
Monocytes Relative: 5 %
Neutro Abs: 16.9 10*3/uL — ABNORMAL HIGH (ref 1.7–7.7)
Neutrophils Relative %: 80 %
Platelets: 196 10*3/uL (ref 150–400)
RBC: 4.17 MIL/uL — ABNORMAL LOW (ref 4.22–5.81)
RDW: 13.8 % (ref 11.5–15.5)
WBC: 21.3 10*3/uL — ABNORMAL HIGH (ref 4.0–10.5)
nRBC: 1.5 % — ABNORMAL HIGH (ref 0.0–0.2)

## 2020-07-26 LAB — COMPREHENSIVE METABOLIC PANEL
ALT: 103 U/L — ABNORMAL HIGH (ref 0–44)
AST: 105 U/L — ABNORMAL HIGH (ref 15–41)
Albumin: 1.9 g/dL — ABNORMAL LOW (ref 3.5–5.0)
Alkaline Phosphatase: 75 U/L (ref 38–126)
Anion gap: 17 — ABNORMAL HIGH (ref 5–15)
BUN: 97 mg/dL — ABNORMAL HIGH (ref 8–23)
CO2: 26 mmol/L (ref 22–32)
Calcium: 7.2 mg/dL — ABNORMAL LOW (ref 8.9–10.3)
Chloride: 92 mmol/L — ABNORMAL LOW (ref 98–111)
Creatinine, Ser: 4.16 mg/dL — ABNORMAL HIGH (ref 0.61–1.24)
GFR calc Af Amer: 16 mL/min — ABNORMAL LOW (ref >60)
GFR calc non Af Amer: 14 mL/min — ABNORMAL LOW (ref >60)
Glucose, Bld: 217 mg/dL — ABNORMAL HIGH (ref 70–99)
Potassium: 3.9 mmol/L (ref 3.5–5.1)
Sodium: 135 mmol/L (ref 135–145)
Total Bilirubin: 0.6 mg/dL (ref 0.3–1.2)
Total Protein: 4.6 g/dL — ABNORMAL LOW (ref 6.5–8.1)

## 2020-07-26 LAB — HEPARIN LEVEL (UNFRACTIONATED)
Heparin Unfractionated: 0.13 IU/mL — ABNORMAL LOW (ref 0.30–0.70)
Heparin Unfractionated: 0.25 IU/mL — ABNORMAL LOW (ref 0.30–0.70)

## 2020-07-26 LAB — MAGNESIUM: Magnesium: 2.4 mg/dL (ref 1.7–2.4)

## 2020-07-26 LAB — APTT: aPTT: 38 seconds — ABNORMAL HIGH (ref 24–36)

## 2020-07-26 MED ORDER — HEPARIN (PORCINE) 25000 UT/250ML-% IV SOLN
1200.0000 [IU]/h | INTRAVENOUS | Status: DC
  Filled 2020-07-26: qty 250

## 2020-07-26 MED ORDER — HEPARIN (PORCINE) 2000 UNITS/L FOR CRRT
INTRAVENOUS_CENTRAL | Status: DC | PRN
  Administered 2020-07-27: 2000 mL via INTRAVENOUS_CENTRAL
  Filled 2020-07-26 (×14): qty 1000
  Filled 2020-07-26: qty 4000
  Filled 2020-07-26 (×5): qty 1000
  Filled 2020-07-26: qty 2000
  Filled 2020-07-26 (×4): qty 1000

## 2020-07-26 MED ORDER — ALTEPLASE 2 MG IJ SOLR
2.0000 mg | Freq: Once | INTRAMUSCULAR | Status: AC
  Administered 2020-07-26: 2 mg
  Filled 2020-07-26: qty 2

## 2020-07-26 MED ORDER — INSULIN DETEMIR 100 UNIT/ML ~~LOC~~ SOLN
25.0000 [IU] | Freq: Two times a day (BID) | SUBCUTANEOUS | Status: DC
  Administered 2020-07-26 – 2020-07-28 (×5): 25 [IU] via SUBCUTANEOUS
  Filled 2020-07-26 (×6): qty 0.25

## 2020-07-26 MED ORDER — VANCOMYCIN HCL 1500 MG/300ML IV SOLN: 1500.0000 mg | INTRAVENOUS | Status: DC

## 2020-07-26 MED ORDER — HEPARIN (PORCINE) 25000 UT/250ML-% IV SOLN
1350.0000 [IU]/h | INTRAVENOUS | Status: DC
  Administered 2020-07-26 (×2): 1350 [IU]/h via INTRAVENOUS
  Filled 2020-07-26: qty 250

## 2020-07-26 MED ORDER — STERILE WATER FOR INJECTION IJ SOLN
INTRAMUSCULAR | Status: AC
  Filled 2020-07-26: qty 10

## 2020-07-26 MED ORDER — VANCOMYCIN HCL 1250 MG/250ML IV SOLN
1250.0000 mg | INTRAVENOUS | Status: DC
  Administered 2020-07-26: 1250 mg via INTRAVENOUS
  Filled 2020-07-26 (×2): qty 250

## 2020-07-26 MED ORDER — ACD FORMULA A 0.73-2.45-2.2 GM/100ML VI SOLN
3000.0000 mL | Status: DC
  Administered 2020-07-26: 3000 mL via INTRAVENOUS_CENTRAL
  Administered 2020-07-27 (×3): 1000 mL via INTRAVENOUS_CENTRAL
  Administered 2020-07-27: 3000 mL via INTRAVENOUS_CENTRAL
  Administered 2020-07-27 – 2020-07-28 (×5): 1000 mL via INTRAVENOUS_CENTRAL
  Administered 2020-07-28: 3000 mL via INTRAVENOUS_CENTRAL
  Administered 2020-07-28: 1000 mL via INTRAVENOUS_CENTRAL
  Administered 2020-07-29: 3000 mL via INTRAVENOUS_CENTRAL
  Administered 2020-07-29 – 2020-07-30 (×10): 1000 mL via INTRAVENOUS_CENTRAL
  Administered 2020-07-31: 3000 mL via INTRAVENOUS_CENTRAL
  Administered 2020-07-31 (×2): 1000 mL via INTRAVENOUS_CENTRAL
  Administered 2020-07-31: 3000 mL via INTRAVENOUS_CENTRAL
  Administered 2020-08-01 (×8): 1000 mL via INTRAVENOUS_CENTRAL
  Administered 2020-08-02: 3000 mL via INTRAVENOUS_CENTRAL
  Administered 2020-08-02 (×2): 1000 mL via INTRAVENOUS_CENTRAL
  Filled 2020-07-26 (×2): qty 1000
  Filled 2020-07-26 (×2): qty 3000
  Filled 2020-07-26 (×2): qty 1000
  Filled 2020-07-26: qty 3000
  Filled 2020-07-26: qty 1000
  Filled 2020-07-26: qty 3000
  Filled 2020-07-26: qty 1000
  Filled 2020-07-26 (×5): qty 3000
  Filled 2020-07-26: qty 1000
  Filled 2020-07-26 (×4): qty 3000
  Filled 2020-07-26: qty 1000
  Filled 2020-07-26 (×7): qty 3000
  Filled 2020-07-26 (×4): qty 1000
  Filled 2020-07-26 (×2): qty 3000
  Filled 2020-07-26: qty 1000
  Filled 2020-07-26: qty 3000
  Filled 2020-07-26: qty 1000
  Filled 2020-07-26 (×5): qty 3000
  Filled 2020-07-26: qty 1000
  Filled 2020-07-26: qty 3000

## 2020-07-26 MED ORDER — ALTEPLASE 2 MG IJ SOLR: 2.0000 mg | Freq: Once | INTRAMUSCULAR | Status: DC | PRN

## 2020-07-26 MED ORDER — SODIUM CHLORIDE 0.9 % IV SOLN
2.0000 g | Freq: Two times a day (BID) | INTRAVENOUS | Status: DC
  Administered 2020-07-26 – 2020-07-27 (×2): 2 g via INTRAVENOUS
  Filled 2020-07-26 (×2): qty 2

## 2020-07-26 MED ORDER — DEXTROSE 5 % IV SOLN
20.0000 g | INTRAVENOUS | Status: DC
  Administered 2020-07-26 – 2020-08-02 (×9): 20 g via INTRAVENOUS_CENTRAL
  Filled 2020-07-26 (×14): qty 200

## 2020-07-26 MED ORDER — PRISMASOL BGK 4/2.5 32-4-2.5 MEQ/L REPLACEMENT SOLN: Status: DC

## 2020-07-26 MED ORDER — HEPARIN SODIUM (PORCINE) 1000 UNIT/ML DIALYSIS
1000.0000 [IU] | INTRAMUSCULAR | Status: DC | PRN
  Administered 2020-07-26 (×4): 1000 [IU] via INTRAVENOUS_CENTRAL
  Filled 2020-07-26 (×5): qty 6

## 2020-07-26 MED ORDER — SODIUM CHLORIDE 0.9 % IV SOLN
INTRAVENOUS | Status: DC
  Filled 2020-07-26 (×39): qty 4.5

## 2020-07-26 MED ORDER — SODIUM CHLORIDE 0.9 % IV SOLN: 2.0000 g | INTRAVENOUS | Status: DC

## 2020-07-26 MED ORDER — ACD FORMULA A 0.73-2.45-2.2 GM/100ML VI SOLN
3000.0000 mL | Status: DC
  Filled 2020-07-26 (×2): qty 3000

## 2020-07-26 NOTE — Progress Notes (Signed)
eLink Physician-Brief Progress Note Patient Name: Alex [name] DOB: [birth year] MRN: [id number]   Date of Service  07/26/2020  HPI/Events of Note  Patient has had issues with his dialysis catheter, and with marked bradycardia on CRRT.  eICU Interventions  Resumption of CRRT was deferred until the a.m.        Kerry Kass [name] 07/26/2020, 1:07 AM

## 2020-07-26 NOTE — Progress Notes (Signed)
eLink Physician-Brief Progress Note Patient Name: Alex [name] DOB: 1956-02-23 MRN: [id number]   Date of Service  07/26/2020  HPI/Events of Note  Called to camera in due to vent desynchrony which seems to be interfering with CRRT as well. Also with episode of hypertension  eICU Interventions  Improved with increased sedation Discussed with nurse, call eLink with if further concerns     Intervention Category Major Interventions: Respiratory failure - evaluation and management;Other:  Judd Lien 07/26/2020, 10:30 PM

## 2020-07-26 NOTE — Progress Notes (Signed)
Patient ID: Alex [name], male   DOB: 01/17/1956, 64 y.o.   MRN: [id number] S: pt was taken off CRRT last night due to severe bradycardia and hemodynamic instability. Today HR's are up to 90- 110 range, pt is on levo gtt at 14 ug /min. FiO2 is 50%. Wt's are up 4kg from admit and I/O from admit are 24L in and 15 L out (+9L).    O:BP 117/62   Pulse 96   Temp 99.3 F (37.4 C)   Resp (!) 32   Ht 5' 9"  (1.753 m)   Wt 112.5 kg   SpO2 100%   BMI 36.62 kg/m   Intake/Output Summary (Last 24 hours) at 07/26/2020 1314 Last data filed at 07/26/2020 1019 Gross per 24 hour  Intake 1971.54 ml  Output 540 ml  Net 1431.54 ml   Intake/Output: I/O last 3 completed shifts: In: 5391.4 [I.V.:3179.8; Other:10; NG/GT:1295.5; IV [id number]:160] Out: 1067 [Urine:52; Other:1015]  Intake/Output this shift:  Total I/O In: 399.1 [I.V.:339; NG/GT:20; IV Piggyback:40.1] Out: 15 [Urine:15] Weight change:  Gen: intubated and sedated Physical exam: unable to complete due to COVID + status.  In order to preserve PPE equipment and to minimize exposure to providers.  Notes from other caregivers reviewed  Assessment/Plan: 1. Recurring bradycardia with worsening shock- has been progressive and recurring since admission.  Unclear cause of acute worsening of acidemia/hypotension.  Seems to be improved today.  2. OliguricAKI vs progressive CKD - in setting of covid pneumonia and concomitant ARBuse. Unclearwhat stage of CKD existed prior to this presentation as there is no baseline Scr known. He has poor medical literacy and insight. 1. HD catheter placed 07/21/20 and initiated CRRT 2. Renal USconsistent with chronic medical renal disease. 3. Off ofmetformin and losartan 4. Goal CVP of <4 mmHg per PCCM 5. Keep even w/ CRRT today 6. Systemic anticoagulation as well as intra-circuit heparin were employed however pt cont'd to clot multiple filters over last 48hrs > - will TPA the catheter and resume CRRT w/ Oxiris filter  but using citrate anticoagulation (also is getting IV systemic heparin) to see if these measures reduce the clotting issue.  If continues to clot filters / alarm, etc., may need HD cath replacement as well 3. Acute hypoxic respiratory failure with ARDS due to covid pneumonia.s/pintubation 07/21/20. 4. Covid pneumonia- unvaccinated smoker. He has been treated with decadron, rocephin, remdesivir per PCCM 5. Acute right leg DVT- on systemic heparin infusion. 6. Hyperkalemia- due to #1.Improved with CRRT. 7. Metabolic acidosis- due to #1. Improving with CRRT.  Can stop IV bicarb if ok with PCCM. 8. Proteinuria- likely due to underlying diabetic nephropathy, however we have seen worsening of proteinuria with covid-19 infection.  Kelly Splinter, MD 07/26/2020, 1:17 PM       Recent Labs  Lab 07/21/20 0307 07/21/20 2100 07/22/20 0350 07/22/20 2100 07/23/20 0210 07/23/20 0210 07/23/20 1600 07/23/20 1600 07/24/20 0359 07/24/20 0359 07/24/20 1202 07/24/20 1202 07/24/20 1217 07/24/20 1223 07/24/20 1600 07/25/20 0212 07/25/20 1600 07/26/20 0027 07/26/20 0400  NA 136   < > 136   < > 136  137   < > 136   < > 133*  133*   < > 138   < > 135 139 134* 135  135 140 137 136  135  K 5.8*   < > 4.9   < > 4.2  4.3   < > 4.3   < > 4.4  4.3   < > 2.8*   < >  2.8* 2.7* 4.2 3.5  3.5 3.9 4.5 3.9  3.9  CL 101   < > 100   < > 99  100   < > 97*   < > 97*  96*   < > 96*  --   --  97* 94* 92*  92* 97* 94* 92*  92*  CO2 19*   < > 20*   < > 27  25   < > 26   < > 26  25  --  16*  --   --   --  25 31  30  32 27 25  26   GLUCOSE 260*   < > 276*   < > 255*  256*   < > 275*   < > 252*  246*   < > 337*  --   --  333* 302* 212*  216* 153* 205* 217*  217*  BUN 120*   < > 113*   < > 59*  59*   < > 48*   < > 47*  47*   < > 42*  --   --  41* 57* 61*  60* 72* 85* 95*  97*  CREATININE 8.43*   < > 7.13*   < > 3.46*  3.44*   < > 3.07*   < > 2.57*  2.65*   < > 2.88*  --   --  2.80* 3.01* 2.85*  2.71*  3.11* 3.83* 4.42*  4.16*  ALBUMIN 2.1*   < > 2.1*   < > 2.0*  2.0*   < > 2.1*   < > 2.2*  2.3*  --  1.9*  --   --   --  1.8* 1.7*  1.7* 1.9* 2.0* 1.9*  1.9*  CALCIUM 7.4*   < > 7.1*   < > 6.9*  6.9*   < > 7.0*   < > 7.4*  7.5*  --  7.2*  --   --   --  6.7* 6.5*  6.5* 6.9* 7.3* 7.3*  7.2*  PHOS 7.9*   < > 8.1*   < > 4.7*  4.9*   < > 4.3  --  3.5  3.6  --   --   --   --   --  2.8 2.5  2.6 4.9* 6.0* 5.4*  AST 83*  --  49*  --  55*  --   --   --  85*  --  163*  --   --   --   --  184*  --   --  105*  ALT 40  --  30  --  28  --   --   --  38  --  91*  --   --   --   --  116*  --   --  103*   < > = values in this interval not displayed.   Liver Function Tests: Recent Labs  Lab 07/24/20 1202 07/24/20 1600 07/25/20 0212 07/25/20 0212 07/25/20 1600 07/26/20 0027 07/26/20 0400  AST 163*  --  184*  --   --   --  105*  ALT 91*  --  116*  --   --   --  103*  ALKPHOS 85  --  71  --   --   --  75  BILITOT 0.5  --  0.3  --   --   --  0.6  PROT 5.2*  --  4.7*  --   --   --  4.6*  ALBUMIN 1.9*   < > 1.7*  1.7*   < > 1.9* 2.0* 1.9*  1.9*   < > = values in this interval not displayed.   No results for input(s): LIPASE, AMYLASE in the last 168 hours. No results for input(s): AMMONIA in the last 168 hours. CBC: Recent Labs  Lab 07/24/20 0359 07/24/20 0359 07/24/20 1202 07/24/20 1217 07/25/20 0212 07/25/20 1830 07/26/20 0400  WBC 12.1*   < > 33.6*   < > 15.6* 19.2* 21.3*  NEUTROABS 10.4*  --   --   --  12.8*  --  16.9*  HGB 14.0   < > 13.8   < > 12.1* 11.9* 11.9*  HCT 43.0   < > 44.7   < > 35.2* 36.3* 36.1*  MCV 87.6  --  91.8  --  82.8 85.8 86.6  PLT 174   < > 238   < > 157 205 196   < > = values in this interval not displayed.   Cardiac Enzymes: Recent Labs  Lab 07/24/20 1202  CKTOTAL 2,124*  CKMB 14.0*   CBG: Recent Labs  Lab 07/25/20 1928 07/25/20 2302 07/26/20 0333 07/26/20 0746 07/26/20 1241  GLUCAP 171* 178* 192* 227* 262*    Iron Studies:  Recent Labs     07/25/20 0212  FERRITIN 204   Studies/Results: CT ABDOMEN PELVIS WO CONTRAST  Result Date: 07/24/2020 CLINICAL DATA:  Sepsis, hypoxia, COVID pneumonia EXAM: CT ABDOMEN AND PELVIS WITHOUT CONTRAST TECHNIQUE: Multidetector CT imaging of the abdomen and pelvis was performed following the standard protocol without IV contrast. Oral enteric contrast was administered. COMPARISON:  None. FINDINGS: Lower chest: Cardiomegaly. Very dense heterogeneous airspace opacity and consolidation of the dependent bilateral lung bases, with diffuse ground-glass opacity in the remaining aerated portions of the lungs. Hepatobiliary: No solid liver abnormality is seen. No gallstones, gallbladder wall thickening, or biliary dilatation. Pancreas: Unremarkable. No pancreatic ductal dilatation or surrounding inflammatory changes. Spleen: Normal in size without significant abnormality. Adrenals/Urinary Tract: Adrenal glands are unremarkable. Kidneys are normal, without renal calculi, solid lesion, or hydronephrosis. The urinary bladder is decompressed by Foley catheter. Stomach/Bowel: Stomach is within normal limits. Esophagogastric tube with tip and side port in the gastric body. Appendix appears normal. No evidence of bowel wall thickening, distention, or inflammatory changes. Sigmoid diverticulosis. Vascular/Lymphatic: Aortic atherosclerosis. No enlarged abdominal or pelvic lymph nodes. Reproductive: No mass or other significant abnormality. Other: No abdominal wall hernia or abnormality. No abdominopelvic ascites. There is retroperitoneal soft tissue stranding overlying the aortic bifurcation and bilateral common iliac vessels (series 2, image 73). Musculoskeletal: No acute or significant osseous findings. IMPRESSION: 1. No definite noncontrast CT findings of the abdomen or pelvis to explain sepsis. 2. There is retroperitoneal soft tissue stranding overlying the aortic bifurcation and bilateral common iliac vessels, of uncertain  nature or significance, without evidence of mass, lymphadenopathy, or inflammatory findings specific to an abdominal or pelvic organ. 3. Very dense heterogeneous airspace opacity and consolidation of the dependent bilateral lung bases, with diffuse ground-glass opacity in the remaining aerated portions of the lungs. Findings are consistent with multifocal infection and COVID-19 airspace disease. 4. Aortic Atherosclerosis (ICD10-I70.0). Electronically Signed   By: Eddie Candle M.D.   On: 07/24/2020 16:51   CT HEAD WO CONTRAST  Result Date: 07/24/2020 CLINICAL DATA:  Mental status change, CNS infection suspected. Additional history provided: Sepsis, acute hypoxic respiratory failure due to COVID-19 pneumonia. EXAM: CT HEAD WITHOUT CONTRAST TECHNIQUE: Contiguous axial images were  obtained from the base of the skull through the vertex without intravenous contrast. COMPARISON:  No pertinent prior exams are available for comparison. FINDINGS: Brain: Cerebral volume is normal. Moderate patchy hypoattenuation within the cerebral white matter is nonspecific, but most commonly seen on the basis of chronic small vessel ischemia. There is no acute intracranial hemorrhage. No demarcated cortical infarct is identified. No extra-axial fluid collection. No evidence of intracranial mass. No midline shift. Vascular: No hyperdense vessel.  Atherosclerotic calcifications. Skull: Normal. Negative for fracture or focal lesion. Sinuses/Orbits: Presumed chronic newly displaced deformities of the lamina papyracea bilaterally. Mild ethmoid sinus mucosal thickening. Left mastoid effusion. IMPRESSION: No CT evidence of acute intracranial abnormality. Moderate patchy hypoattenuation within the cerebral white matter is nonspecific, but most commonly seen on the basis of chronic small vessel ischemia. Mild ethmoid sinus mucosal thickening. Left mastoid effusion. Electronically Signed   By: Kellie Simmering DO   On: 07/24/2020 16:25   DG Chest  Port 1 View  Result Date: 07/26/2020 CLINICAL DATA:  Acute hypercapnic respiratory failure EXAM: PORTABLE CHEST 1 VIEW COMPARISON:  07/25/2020 FINDINGS: Endotracheal tube terminates 4.5 cm above the carina. Cardiomegaly with mild interstitial edema. No definite pleural effusions. No pneumothorax. Right IJ venous catheter terminates at the cavoatrial junction. Left IJ venous catheter terminates cavoatrial junction. Enteric tube courses into the stomach. IMPRESSION: Cardiomegaly with mild interstitial edema. No definite pleural effusions. Endotracheal tube terminates 4.5 cm above the carina. Additional support apparatus as above. Electronically Signed   By: Julian Hy M.D.   On: 07/26/2020 07:04   DG Chest Port 1 View  Result Date: 07/25/2020 CLINICAL DATA:  COVID.  Respiratory distress. EXAM: PORTABLE CHEST 1 VIEW COMPARISON:  Chest x-ray 07/24/2020. FINDINGS: Endotracheal tube, NG tube, bilateral IJ lines stable position. Stable cardiomegaly. Diffuse severe bilateral pulmonary infiltrates/edema again noted. Similar findings noted on prior study. Small left pleural effusion cannot be excluded. No pneumothorax. IMPRESSION: 1.  Lines and tubes in stable position. 2.  Stable cardiomegaly. 3. Diffuse severe bilateral pulmonary infiltrates/edema again noted. Interim progression from prior exam. Small left pleural effusion cannot be excluded. Electronically Signed   By: Marcello Moores  Register   On: 07/25/2020 06:29   . vitamin C  500 mg Per Tube Daily  . chlorhexidine gluconate (MEDLINE KIT)  15 mL Mouth Rinse BID  . Chlorhexidine Gluconate Cloth  6 each Topical Daily  . docusate  100 mg Per Tube BID  . feeding supplement (PROSource TF)  90 mL Per Tube 5 X Daily  . insulin aspart  0-20 Units Subcutaneous Q4H  . insulin aspart  10 Units Subcutaneous Q4H  . mouth rinse  15 mL Mouth Rinse 10 times per day  . polyethylene glycol  17 g Per Tube Daily  . predniSONE  50 mg Oral Q breakfast  . sodium chloride  flush  3 mL Intravenous Q12H  . zinc sulfate  220 mg Per Tube Daily    BMET    Component Value Date/Time   NA 136 07/26/2020 0400   NA 135 07/26/2020 0400   K 3.9 07/26/2020 0400   K 3.9 07/26/2020 0400   CL 92 (L) 07/26/2020 0400   CL 92 (L) 07/26/2020 0400   CO2 25 07/26/2020 0400   CO2 26 07/26/2020 0400   GLUCOSE 217 (H) 07/26/2020 0400   GLUCOSE 217 (H) 07/26/2020 0400   BUN 95 (H) 07/26/2020 0400   BUN 97 (H) 07/26/2020 0400   CREATININE 4.42 (H) 07/26/2020 0400   CREATININE  4.16 (H) 07/26/2020 0400   CALCIUM 7.3 (L) 07/26/2020 0400   CALCIUM 7.2 (L) 07/26/2020 0400   GFRNONAA 13 (L) 07/26/2020 0400   GFRNONAA 14 (L) 07/26/2020 0400   GFRAA 15 (L) 07/26/2020 0400   GFRAA 16 (L) 07/26/2020 0400   CBC    Component Value Date/Time   WBC 21.3 (H) 07/26/2020 0400   RBC 4.17 (L) 07/26/2020 0400   HGB 11.9 (L) 07/26/2020 0400   HCT 36.1 (L) 07/26/2020 0400   PLT 196 07/26/2020 0400   MCV 86.6 07/26/2020 0400   MCH 28.5 07/26/2020 0400   MCHC 33.0 07/26/2020 0400   RDW 13.8 07/26/2020 0400   LYMPHSABS 2.2 07/26/2020 0400   MONOABS 1.2 (H) 07/26/2020 0400   EOSABS 0.0 07/26/2020 0400   BASOSABS 0.1 07/26/2020 0400

## 2020-07-26 NOTE — Progress Notes (Signed)
NAME:  VANESSA ALESI, MRN:  [id number], DOB:  [birth year], LOS: 6 ADMISSION DATE:  07/25/2020, CONSULTATION DATE:  8/1 REFERRING MD:  Sedonia Small, CHIEF COMPLAINT:  Dyspnea   Brief History   64 y/o male with acute hypoxemic respiratory failure due to COVID 19 pneumonia admitted on 8/1  Past Medical History  Cigarette smoker DM2 Hypertension CKD?  Significant Hospital Events   8/1 admission 8/2 intubation, start CRRT 8/5 significant improvement in oxygenation; mid morning developed severe sudden shock of uncertain etiology, low grade temp  Consults:  Nephrology  Procedures:  8/2 ETT >  8/2 L IJ CVL >  8/3 R IJ HD cath >   Significant Diagnostic Tests:  8/2 lower ext doppler/vascular ultrasound >> acute R DVT peroneal vein, left negative 8/2 renal ultrasound >> no hydro, findings consistent with medicorenal disease  Micro Data:  8/1 SARS COV 2 > positive 8/1 blood >   8/5 blood >  8/5 resp >   Antimicrobials/COVID Rx:  8/1 ceftriaxone > 8/5 8/1 remdesivir >  8/1 tocilizumab   Interim history/subjective:   Had to be stopped on CRRT overnight due to bradycardia, resolved with rewarming with bear hugger.  Has had frequent clotting episodes with CRRT filters.  Objective   Blood pressure 117/62, pulse 83, temperature 98.6 F (37 C), resp. rate (!) 24, height 5' 9"  (1.753 m), weight 112.5 kg, SpO2 100 %. CVP:  [6 mmHg-31 mmHg] 13 mmHg  Vent Mode: PRVC FiO2 (%):  [50 %] 50 % Set Rate:  [25 bmp] 25 bmp Vt Set:  [560 mL] 560 mL PEEP:  [14 cmH20] 14 cmH20 Plateau Pressure:  [28 cmH20-35 cmH20] 35 cmH20   Intake/Output Summary (Last 24 hours) at 07/26/2020 1829 Last data filed at 07/26/2020 1700 Gross per 24 hour  Intake 2867.77 ml  Output 107 ml  Net 2760.77 ml   Filed Weights   07/22/20 0324 07/23/20 0500 07/24/20 0500  Weight: 112.5 kg 113.4 kg 112.5 kg    Examination:  General: Critically ill-appearing man lying in bed intubated, sedated HENT: [place]/AT, eyes anicteric,  mild scleral edema PULM: Breathing synchronously with the vent, CTA B CV: Regular rate and rhythm, no murmurs GI: Obese, soft, nontender, nondistended MSK: Pitting edema bilateral lower extremities, appropriate muscle tone. Neuro: RASS -5, PERRL  8/7 CXR personally reviewed-persistent diffuse bilateral airspace disease  Resolved Hospital Problem list     Assessment & Plan:  ARDS due to COVID 19 pneumonia > worsening oxygenation on 8/6 Picture worrisome for HCAP Acute pulmonary edema due to acute renal failure Likely acute massive PE with cor pulmonale-> s/p systemic TPA on 8/6. -Continue low tidal volume ventilation.  Peak pressures low.  Unable to assess plateau pressure due to respiratory effort. -Daily SAT and SBT when appropriate.  Having increased respiratory efforts today.  Oxygen requirements remain too high for SBT. -Need to restart CRRT with anticoagulated circuit to help with volume management to address pulmonary edema -Continue steroids -Broad-spectrum antibiotics  Severe shock midmorning 8/5, periodic bradycardia> no clear source, possibly due to hypothermia -Continue empiric antibiotics.  If cultures remain negative may be able to de-escalate now that PE is unknown precipitating event -Vasopressors as required to maintain MAP greater than 65 -Continue anticoagulation  Acute DVT R leg -Continue heparin infusion  AKI with hyperkalemia BPH Metabolic acidosis-stable -Repeat be met today after spending prolonged time off CRRT-relatively stable -Resume CRRT as able.  Awaiting Cathflo to be removed from dialysis catheter. -Continue to monitor renal function and  I/O  Hyperglycemia-uncontrolled -Adding Levemir 25 units twice daily -Sliding scale insulin as needed -Will be drawn 100 4280 while admitted to the ICU  Need for sedation for mechanical ventilation -RASS target: -1 -Continue versed prn, fentanyl infusion per PAD protocol   Best practice:  Diet:  regular Pain/Anxiety/Delirium protocol (if indicated): n/a VAP protocol (if indicated): n/a DVT prophylaxis: heparin infusion GI prophylaxis: n/a Glucose control: SSI Mobility: bed rest Code Status: full Family Communication: brother Herbie [place] Disposition: remain in ICU  Labs   CBC: Recent Labs  Lab 07/22/20 0350 07/22/20 0350 07/23/20 0210 07/23/20 0210 07/24/20 0359 07/24/20 0359 07/24/20 1202 07/24/20 1202 07/24/20 1217 07/24/20 1223 07/25/20 0212 07/25/20 1830 07/26/20 0400  WBC 6.6   < > 9.9   < > 12.1*  --  33.6*  --   --   --  15.6* 19.2* 21.3*  NEUTROABS 5.8  --  8.6*  --  10.4*  --   --   --   --   --  12.8*  --  16.9*  HGB 13.8   < > 12.8*   < > 14.0   < > 13.8   < > 15.0 16.3 12.1* 11.9* 11.9*  HCT 42.8   < > 38.9*   < > 43.0   < > 44.7   < > 44.0 48.0 35.2* 36.3* 36.1*  MCV 87.0   < > 86.6   < > 87.6  --  91.8  --   --   --  82.8 85.8 86.6  PLT 242   < > 218   < > 174  --  238  --   --   --  157 205 196   < > = values in this interval not displayed.    Basic Metabolic Panel: Recent Labs  Lab 07/22/20 2100 07/22/20 2100 07/23/20 0210 07/23/20 1600 07/24/20 0359 07/24/20 1202 07/25/20 0212 07/25/20 1600 07/26/20 0027 07/26/20 0400 07/26/20 1428  NA 138   < > 136  137   < > 133*  133*   < > 135  135 140 137 136  135 134*  K 4.1   < > 4.2  4.3   < > 4.4  4.3   < > 3.5  3.5 3.9 4.5 3.9  3.9 4.5  CL 103   < > 99  100   < > 97*  96*   < > 92*  92* 97* 94* 92*  92* 92*  CO2 23   < > 27  25   < > 26  25   < > 31  30 32 27 25  26 25   GLUCOSE 239*   < > 255*  256*   < > 252*  246*   < > 212*  216* 153* 205* 217*  217* 308*  BUN 65*   < > 59*  59*   < > 47*  47*   < > 61*  60* 72* 85* 95*  97* 109*  CREATININE 3.89*   < > 3.46*  3.44*   < > 2.57*  2.65*   < > 2.85*  2.71* 3.11* 3.83* 4.42*  4.16* 5.03*  CALCIUM 6.5*   < > 6.9*  6.9*   < > 7.4*  7.5*   < > 6.5*  6.5* 6.9* 7.3* 7.3*  7.2* 7.3*  MG 2.3  --  2.4  --  2.6*  --  2.3   --   --  2.4  --  PHOS 4.8*  4.9*   < > 4.7*  4.9*   < > 3.5  3.6   < > 2.5  2.6 4.9* 6.0* 5.4* 6.2*   < > = values in this interval not displayed.   GFR: Estimated Creatinine Clearance: 18.6 mL/min (A) (by C-G formula based on SCr of 5.03 mg/dL (H)). Recent Labs  Lab 07/27/2020 1248 07/23/2020 1624 07/24/20 0359 07/24/20 1130 07/24/20 1202 07/24/20 1928 07/25/20 0212 07/25/20 1830 07/25/20 2106 07/26/20 0400  PROCALCITON 4.75  --   --   --   --   --   --   --   --   --   WBC 5.6   < >   < >  --  33.6*  --  15.6* 19.2*  --  21.3*  LATICACIDVEN 2.1*   < >  --  8.4* >11.0* 3.6*  --   --  2.6*  --    < > = values in this interval not displayed.    Liver Function Tests: Recent Labs  Lab 07/23/20 0210 07/23/20 1600 07/24/20 0359 07/24/20 0359 07/24/20 1202 07/24/20 1600 07/25/20 0212 07/25/20 1600 07/26/20 0027 07/26/20 0400 07/26/20 1428  AST 55*  --  85*  --  163*  --  184*  --   --  105*  --   ALT 28  --  38  --  91*  --  116*  --   --  103*  --   ALKPHOS 64  --  78  --  85  --  71  --   --  75  --   BILITOT 0.4  --  0.3  --  0.5  --  0.3  --   --  0.6  --   PROT 5.4*  --  6.0*  --  5.2*  --  4.7*  --   --  4.6*  --   ALBUMIN 2.0*  2.0*   < > 2.2*  2.3*   < > 1.9*   < > 1.7*  1.7* 1.9* 2.0* 1.9*  1.9* 1.9*   < > = values in this interval not displayed.   No results for input(s): LIPASE, AMYLASE in the last 168 hours. No results for input(s): AMMONIA in the last 168 hours.  ABG    Component Value Date/Time   PHART 7.541 (H) 07/25/2020 1352   PCO2ART 34.4 07/25/2020 1352   PO2ART 63.8 (L) 07/25/2020 1352   HCO3 29.7 (H) 07/25/2020 1352   TCO2 17 (L) 07/24/2020 1223   ACIDBASEDEF 15.0 (H) 07/24/2020 1217   O2SAT 94.6 07/25/2020 1352     Coagulation Profile: Recent Labs  Lab 07/21/20 2301 07/25/20 1830  INR 1.3* 1.2    Cardiac Enzymes: Recent Labs  Lab 07/24/20 1202  CKTOTAL 2,124*  CKMB 14.0*    HbA1C: Hgb A1c MFr Bld  Date/Time Value Ref  Range Status  07/23/2020 09:36 AM 12.3 (H) 4.8 - 5.6 % Final    Comment:    (NOTE) Pre diabetes:          5.7%-6.4%  Diabetes:              >6.4%  Glycemic control for   <7.0% adults with diabetes     CBG: Recent Labs  Lab 07/25/20 2302 07/26/20 0333 07/26/20 0746 07/26/20 1241 07/26/20 1530  GLUCAP 178* 192* 227* 262* 262*      This patient is critically ill with multiple organ system failure which requires frequent high complexity  decision making, assessment, support, evaluation, and titration of therapies. This was completed through the application of advanced monitoring technologies and extensive interpretation of multiple databases. During this encounter critical care time was devoted to patient care services described in this note for 40 minutes.  Julian Hy, DO 07/26/20 6:40 PM [facility]

## 2020-07-26 NOTE — Procedures (Signed)
Pre-filter = citrate A/C = citrate local protocol ( also on IV hep systemic) Post-filter = BGK 4/2.5 at 400 cc/hr Dialysate = special CRRT fluids for citrate patients (KCL and Mg added) at 2500 cc/hr UG goal = keep even for now Cath = R IJ, will TPA due to issues w/ clotting yesterday  I was present at this CRRT session, have reviewed the session itself and made  appropriate changes Kelly Splinter MD [place] pager [phone]   07/26/2020, 1:11 PM

## 2020-07-26 NOTE — Progress Notes (Signed)
ANTICOAGULATION CONSULT NOTE - Follow Up Consult  Pharmacy Consult for Heparin Indication: acute DVT  No Known Allergies  Patient Measurements: Height: 5\' 9"  (175.3 cm) Weight: 112.5 kg (248 lb) IBW/kg (Calculated) : 70.7 Heparin Dosing Weight: 95 kg  Vital Signs: Temp: 99.1 F (37.3 C) (08/07 1400) Temp Source: Bladder (08/07 0800) BP: 117/62 (08/07 1151) Pulse Rate: 85 (08/07 1400)  Labs: Recent Labs    07/24/20 1202 07/24/20 1217 07/24/20 1316 07/24/20 1600 07/25/20 0212 07/25/20 0212 07/25/20 1200 07/25/20 1600 07/25/20 1815 07/25/20 1830 07/26/20 0027 07/26/20 0400 07/26/20 1428  HGB 13.8   < >  --    < > 12.1*   < >  --   --   --  11.9*  --  11.9*  --   HCT 44.7   < >  --    < > 35.2*  --   --   --   --  36.3*  --  36.1*  --   PLT 238  --   --    < > 157  --   --   --   --  205  --  196  --   APTT  --   --   --   --  59*  --   --   --  29  --   --  38*  --   LABPROT  --   --   --   --   --   --   --   --   --  14.2  --   --   --   INR  --   --   --   --   --   --   --   --   --  1.2  --   --   --   HEPARINUNFRC  --   --   --   --  0.28*   < > 0.10*  --   --   --   --  0.13* 0.25*  CREATININE 2.88*   < >  --    < > 2.85*  2.71*   < >  --  3.11*  --   --  3.83* 4.42*  4.16*  --   CKTOTAL 2,124*  --   --   --   --   --   --   --   --   --   --   --   --   CKMB 14.0*  --   --   --   --   --   --   --   --   --   --   --   --   TROPONINIHS 53*  --  134*  --   --   --   --   --   --   --   --   --   --    < > = values in this interval not displayed.    Estimated Creatinine Clearance: 21.1 mL/min (A) (by C-G formula based on SCr of 4.42 mg/dL (H)).  Assessment: 64 yo male with COVID PNA, now on CRRT, D-dimer rose from 7 to 17 to start IV heparin per Rx dosing for acute R DVT peroneal vein.   Significant Events: -8/6: Pt had bradycardia, hypotension. Pt given alteplase 50 mg IV infusion over 2 hrs for PE @ 16:08 -8/6: Heparin infusion resumed @ 21:41 s/p TPA  with aPTT <80  Today,  07/26/20  HL = 0.25 remains subtherapeutic despite increasing heparin to 1200 units/hr  CBC: Hgb low but stable, Plt WNL and stable  CRRT resumed this evening  Confirmed with RN that heparin infusing at correct rate with no interruptions. No signs/symptoms of bleeding.   Goal of Therapy:  HL 0.3 - 0.5 for first 24 hrs after tPA infusion then resume target goal of 0.3-0.7 Monitor platelets by anticoagulation protocol: Yes   Plan:   No heparin bolus s/p TPA administration  Increase heparin infusion to 1350 units/hr  Check heparin level in 8 hours  Daily heparin level and CBC while on heparin  Monitor for signs/symptoms of bleeding  Lenis Noon, PharmD 07/26/20 4:27 PM

## 2020-07-26 NOTE — Progress Notes (Signed)
ANTICOAGULATION CONSULT NOTE - Follow Up Consult  Pharmacy Consult for Heparin Indication: acute DVT  No Known Allergies  Patient Measurements: Height: 5' 9"  (175.3 cm) Weight: 112.5 kg (248 lb) IBW/kg (Calculated) : 70.7 Heparin Dosing Weight: 95 kg  Vital Signs: Temp: 99.5 F (37.5 C) (08/07 0545) Temp Source: Bladder (08/07 0000) BP: 104/55 (08/07 0545) Pulse Rate: 106 (08/07 0400)  Labs: Recent Labs    07/24/20 1202 07/24/20 1217 07/24/20 1316 07/24/20 1600 07/25/20 0212 07/25/20 0212 07/25/20 1200 07/25/20 1600 07/25/20 1815 07/25/20 1830 07/26/20 0027 07/26/20 0400  HGB 13.8   < >  --    < > 12.1*   < >  --   --   --  11.9*  --  11.9*  HCT 44.7   < >  --    < > 35.2*  --   --   --   --  36.3*  --  36.1*  PLT 238  --   --    < > 157  --   --   --   --  205  --  196  APTT  --   --   --   --  59*  --   --   --  29  --   --  38*  LABPROT  --   --   --   --   --   --   --   --   --  14.2  --   --   INR  --   --   --   --   --   --   --   --   --  1.2  --   --   HEPARINUNFRC  --   --   --   --  0.28*  --  0.10*  --   --   --   --  0.13*  CREATININE 2.88*   < >  --    < > 2.85*  2.71*   < >  --  3.11*  --   --  3.83* 4.42*  4.16*  CKTOTAL 2,124*  --   --   --   --   --   --   --   --   --   --   --   CKMB 14.0*  --   --   --   --   --   --   --   --   --   --   --   TROPONINIHS 53*  --  134*  --   --   --   --   --   --   --   --   --    < > = values in this interval not displayed.    Estimated Creatinine Clearance: 21.1 mL/min (A) (by C-G formula based on SCr of 4.42 mg/dL (H)).   Medications:  Scheduled:  . vitamin C  500 mg Per Tube Daily  . chlorhexidine gluconate (MEDLINE KIT)  15 mL Mouth Rinse BID  . Chlorhexidine Gluconate Cloth  6 each Topical Daily  . docusate  100 mg Per Tube BID  . feeding supplement (PROSource TF)  90 mL Per Tube 5 X Daily  . insulin aspart  0-20 Units Subcutaneous Q4H  . insulin aspart  10 Units Subcutaneous Q4H  . mouth  rinse  15 mL Mouth Rinse 10 times per day  . polyethylene glycol  17 g Per Tube  Daily  . predniSONE  50 mg Oral Q breakfast  . sodium chloride flush  3 mL Intravenous Q12H  . zinc sulfate  220 mg Per Tube Daily   Infusions:  .  prismasol BGK 4/2.5 500 mL/hr at 07/25/20 1053  .  prismasol BGK 4/2.5 300 mL/hr at 07/25/20 2008  . ceFEPime (MAXIPIME) IV 2 g (07/26/20 0542)  . DOPamine Stopped (07/25/20 2244)  . feeding supplement (PIVOT 1.5 CAL) 45 mL/hr at 07/25/20 1600  . fentaNYL infusion INTRAVENOUS 100 mcg/hr (07/26/20 0200)  . heparin 10,000 units/ 20 mL infusion syringe 250 Units/hr (07/25/20 2000)  . heparin 1,200 Units/hr (07/26/20 0552)  . norepinephrine (LEVOPHED) Adult infusion 15 mcg/min (07/26/20 0417)  . prismasol BGK 4/2.5 2,500 mL/hr at 07/25/20 1300  . propofol (DIPRIVAN) infusion 20 mcg/kg/min (07/26/20 0204)  . vancomycin Stopped (07/25/20 1941)  . vasopressin Stopped (07/24/20 2304)   PRN: acetaminophen, EPINEPHrine, fentaNYL, heparin, heparin, midazolam, midazolam, ondansetron **OR** ondansetron (ZOFRAN) IV, sennosides, sodium chloride  Assessment: 64 yo male with COVID PNA, now on CRRT, D-dimer rose from 7 to 17 to start IV heparin per Rx dosing for acute R DVT peroneal vein.  07/26/20, AM  Heparin level SUBtherapeutic on 950 units/hr  CBC: Hg low 12.1, pltc WNL  Heparin held during 2 hr tPA infusion 8/6 and resumed @ 21:41  No bleeding noted or infusion issues noted  Goal of Therapy:  Heparin level 0.3-0.7 units/ml Monitor platelets by anticoagulation protocol: Yes   Plan:   Increase IV heparin rate to 1200 units/hr  Recheck heparin level in 8 hours  Daily heparin level and CBC while on heparin  Monitor for signs/symptoms of bleeding   Alex [name], PharmD 07/26/2020 5:55 AM

## 2020-07-26 NOTE — Progress Notes (Signed)
Pharmacy Antibiotic Note  Alex [name] is a 64 y.o. male admitted on 08/11/2020 with respiratory failure due to [place] went into shock today. Patient was previously on ceftriaxone. Pharmacy has been consulted for vancomycin and cefepime dosing.  Day 2 full abxs **note CRRT currently off since last night** SCr 4.16 est CrCl CG 21 (N 17) WBC 21.3 up New cultures drawn 8/5  Plan: While CRRT off, adjust cefepime from 2g IV q12 to 2g IV q24 Also adjust vanc from 1250mg  IV q24 to 1500mg  IV q48 while CRTT off F/U renal function, clinical course  Height: 5\' 9"  (175.3 cm) Weight: 112.5 kg (248 lb) IBW/kg (Calculated) : 70.7  Temp (24hrs), Avg:98.7 F (37.1 C), Min:96.8 F (36 C), Max:100 F (37.8 C)  Recent Labs  Lab 08/07/2020 1624 07/21/20 0006 07/24/20 0359 07/24/20 0359 07/24/20 1130 07/24/20 1202 07/24/20 1223 07/24/20 1600 07/24/20 1928 07/25/20 0212 07/25/20 1600 07/25/20 1830 07/25/20 2106 07/26/20 0027 07/26/20 0400  WBC  --    < > 12.1*  --   --  33.6*  --   --   --  15.6*  --  19.2*  --   --  21.3*  CREATININE  --    < > 2.57*  2.65*   < >  --  2.88*   < > 3.01*  --  2.85*  2.71* 3.11*  --   --  3.83* 4.42*  4.16*  LATICACIDVEN 1.3  --   --   --  8.4* >11.0*  --   --  3.6*  --   --   --  2.6*  --   --    < > = values in this interval not displayed.    Estimated Creatinine Clearance: 21.1 mL/min (A) (by C-G formula based on SCr of 4.42 mg/dL (H)).    No Known Allergies   Thank you for allowing pharmacy to be a part of this patient's care.  Kara Mead 07/26/2020 7:29 AM

## 2020-07-26 NOTE — Progress Notes (Signed)
Pt became dyssynchronous with ventilator, using accessory muscles and breathing over 35x a min. Gave 2 boluses of Fentanyl and notified elink of dyssynchrony. MD increased max dose of Fentanyl and pt became more synchronous.   Around 2238 pt sustained low HR of 38-low 40's. elink called and per MD give 0.5mg  of Atropine. Pads placed on pt and connected to Zoll monitor at bedside. Atropine given, pt then became tachycardic and hypertensive, sustained, at this time this RN stopped Levophed and Dopamine. 12-lead done which showed A-fib with RVR. Atropine began wearing off BP trending down as well as HR, Levo restarted at low dose, began increasing now at 37mcg.  Pt off CRRT at the time of pt's hemodynamic instability due to Access and Return alarms, unable to continue, Blood returned back to pt. Waiting to hear back from Nephrology and Critical Care MD in regards to restarting CRRT in relation to pt being very unstable. Will continue to monitor at this time.

## 2020-07-27 ENCOUNTER — Inpatient Hospital Stay (HOSPITAL_COMMUNITY): Payer: Medicaid Other

## 2020-07-27 ENCOUNTER — Encounter (HOSPITAL_COMMUNITY): Payer: Self-pay | Admitting: Pulmonary Disease

## 2020-07-27 DIAGNOSIS — J1282 Pneumonia due to coronavirus disease 2019: Secondary | ICD-10-CM

## 2020-07-27 LAB — BLOOD GAS, ARTERIAL
Acid-base deficit: 11.1 mmol/L — ABNORMAL HIGH (ref 0.0–2.0)
Bicarbonate: 16 mmol/L — ABNORMAL LOW (ref 20.0–28.0)
FIO2: 50
O2 Saturation: 65.3 %
Patient temperature: 98.6
pCO2 arterial: 42.5 mmHg (ref 32.0–48.0)
pH, Arterial: 7.2 — ABNORMAL LOW (ref 7.350–7.450)
pO2, Arterial: 44.3 mmHg — ABNORMAL LOW (ref 83.0–108.0)

## 2020-07-27 LAB — RENAL FUNCTION PANEL
Albumin: 2 g/dL — ABNORMAL LOW (ref 3.5–5.0)
Anion gap: 13 (ref 5–15)
BUN: 78 mg/dL — ABNORMAL HIGH (ref 8–23)
CO2: 20 mmol/L — ABNORMAL LOW (ref 22–32)
Calcium: 6.8 mg/dL — ABNORMAL LOW (ref 8.9–10.3)
Chloride: 108 mmol/L (ref 98–111)
Creatinine, Ser: 4.26 mg/dL — ABNORMAL HIGH (ref 0.61–1.24)
GFR calc Af Amer: 16 mL/min — ABNORMAL LOW (ref >60)
GFR calc non Af Amer: 14 mL/min — ABNORMAL LOW (ref >60)
Glucose, Bld: 178 mg/dL — ABNORMAL HIGH (ref 70–99)
Phosphorus: 5.1 mg/dL — ABNORMAL HIGH (ref 2.5–4.6)
Potassium: 4.8 mmol/L (ref 3.5–5.1)
Sodium: 141 mmol/L (ref 135–145)

## 2020-07-27 LAB — CBC
HCT: 33.4 % — ABNORMAL LOW (ref 39.0–52.0)
Hemoglobin: 10.3 g/dL — ABNORMAL LOW (ref 13.0–17.0)
MCH: 28.2 pg (ref 26.0–34.0)
MCHC: 30.8 g/dL (ref 30.0–36.0)
MCV: 91.5 fL (ref 80.0–100.0)
Platelets: 212 10*3/uL (ref 150–400)
RBC: 3.65 MIL/uL — ABNORMAL LOW (ref 4.22–5.81)
RDW: 14.5 % (ref 11.5–15.5)
WBC: 20.7 10*3/uL — ABNORMAL HIGH (ref 4.0–10.5)
nRBC: 2.7 % — ABNORMAL HIGH (ref 0.0–0.2)

## 2020-07-27 LAB — GLUCOSE, CAPILLARY
Glucose-Capillary: 209 mg/dL — ABNORMAL HIGH (ref 70–99)
Glucose-Capillary: 157 mg/dL — ABNORMAL HIGH (ref 70–99)
Glucose-Capillary: 159 mg/dL — ABNORMAL HIGH (ref 70–99)
Glucose-Capillary: 189 mg/dL — ABNORMAL HIGH (ref 70–99)
Glucose-Capillary: 181 mg/dL — ABNORMAL HIGH (ref 70–99)
Glucose-Capillary: 208 mg/dL — ABNORMAL HIGH (ref 70–99)

## 2020-07-27 LAB — HEPARIN LEVEL (UNFRACTIONATED)
Heparin Unfractionated: 0.37 IU/mL (ref 0.30–0.70)
Heparin Unfractionated: 0.18 IU/mL — ABNORMAL LOW (ref 0.30–0.70)
Heparin Unfractionated: 0.58 IU/mL (ref 0.30–0.70)

## 2020-07-27 LAB — BASIC METABOLIC PANEL
Anion gap: 11 (ref 5–15)
BUN: 78 mg/dL — ABNORMAL HIGH (ref 8–23)
CO2: 20 mmol/L — ABNORMAL LOW (ref 22–32)
Calcium: 6.9 mg/dL — ABNORMAL LOW (ref 8.9–10.3)
Chloride: 109 mmol/L (ref 98–111)
Creatinine, Ser: 4.16 mg/dL — ABNORMAL HIGH (ref 0.61–1.24)
GFR calc Af Amer: 16 mL/min — ABNORMAL LOW (ref >60)
GFR calc non Af Amer: 14 mL/min — ABNORMAL LOW (ref >60)
Glucose, Bld: 180 mg/dL — ABNORMAL HIGH (ref 70–99)
Potassium: 4.7 mmol/L (ref 3.5–5.1)
Sodium: 140 mmol/L (ref 135–145)

## 2020-07-27 LAB — TRIGLYCERIDES: Triglycerides: 166 mg/dL — ABNORMAL HIGH (ref <150)

## 2020-07-27 LAB — APTT: aPTT: 60 seconds — ABNORMAL HIGH (ref 24–36)

## 2020-07-27 LAB — MAGNESIUM: Magnesium: 2.4 mg/dL (ref 1.7–2.4)

## 2020-07-27 MED ORDER — HEPARIN (PORCINE) 25000 UT/250ML-% IV SOLN
1600.0000 [IU]/h | INTRAVENOUS | Status: DC
  Administered 2020-07-27 – 2020-07-28 (×2): 1600 [IU]/h via INTRAVENOUS
  Filled 2020-07-27: qty 250

## 2020-07-27 MED ORDER — PANTOPRAZOLE SODIUM 40 MG IV SOLR
40.0000 mg | Freq: Every day | INTRAVENOUS | Status: DC
  Administered 2020-07-27 – 2020-08-08 (×13): 40 mg via INTRAVENOUS
  Filled 2020-07-27 (×13): qty 40

## 2020-07-27 NOTE — Progress Notes (Signed)
ANTICOAGULATION CONSULT NOTE - Follow Up Consult  Pharmacy Consult for Heparin Indication: acute DVT  No Known Allergies  Patient Measurements: Height: 5\' 9"  (175.3 cm) Weight: 112.5 kg (248 lb) IBW/kg (Calculated) : 70.7 Heparin Dosing Weight: 95 kg  Vital Signs: Temp: 97 F (36.1 C) (08/08 0300) Temp Source: Bladder (08/07 1600) BP: 114/75 (08/08 0300) Pulse Rate: 72 (08/08 0200)  Labs: Recent Labs    07/24/20 1202 07/24/20 1217 07/24/20 1316 07/24/20 1600 07/25/20 0212 07/25/20 0212 07/25/20 1200 07/25/20 1600 07/25/20 1815 07/25/20 1830 07/26/20 0027 07/26/20 0400 07/26/20 1428 07/27/20 0200  HGB 13.8   < >  --    < > 12.1*   < >  --   --   --  11.9*  --  11.9*  --   --   HCT 44.7   < >  --    < > 35.2*  --   --   --   --  36.3*  --  36.1*  --   --   PLT 238  --   --    < > 157  --   --   --   --  205  --  196  --   --   APTT  --   --   --   --  59*  --   --   --  29  --   --  38*  --   --   LABPROT  --   --   --   --   --   --   --   --   --  14.2  --   --   --   --   INR  --   --   --   --   --   --   --   --   --  1.2  --   --   --   --   HEPARINUNFRC  --   --   --   --  0.28*  --    < >  --   --   --   --  0.13* 0.25* 0.37  CREATININE 2.88*   < >  --    < > 2.85*  2.71*  --    < >   < >  --   --  3.83* 4.42*  4.16* 5.03*  --   CKTOTAL 2,124*  --   --   --   --   --   --   --   --   --   --   --   --   --   CKMB 14.0*  --   --   --   --   --   --   --   --   --   --   --   --   --   TROPONINIHS 53*  --  134*  --   --   --   --   --   --   --   --   --   --   --    < > = values in this interval not displayed.    Estimated Creatinine Clearance: 18.6 mL/min (A) (by C-G formula based on SCr of 5.03 mg/dL (H)).  Assessment: 64 yo male with COVID PNA, now on CRRT, D-dimer rose from 7 to 17 to start IV heparin per Rx dosing for acute R DVT peroneal vein.  Significant Events: -8/6: Pt had bradycardia, hypotension. Pt given alteplase 50 mg IV infusion over 2  hrs for PE @ 16:08 -8/6: Heparin infusion resumed @ 21:41 s/p TPA with aPTT <80  Today, 07/27/20  HL = 0.37 therapeutic following increasing heparin to 1350 units/hr  CBC: Hgb low but stable, Plt WNL and stable  No signs/symptoms of bleeding noted  Goal of Therapy:  HL 0.3 - 0.5 for first 24 hrs after tPA infusion then resume target goal of 0.3-0.7 Monitor platelets by anticoagulation protocol: Yes   Plan:   Continue heparin infusion @ 1350 units/hr  Repeat heparin level in 8 hours to confirm therapeutic dose  Daily heparin level and CBC while on heparin  Monitor for signs/symptoms of bleeding  Everette Rank, PharmD 07/27/20 3:32 AM

## 2020-07-27 NOTE — Progress Notes (Signed)
ANTICOAGULATION CONSULT NOTE - Follow Up Consult  Pharmacy Consult for Heparin Indication: acute DVT  No Known Allergies  Patient Measurements: Height: 5\' 9"  (175.3 cm) Weight: 124.2 kg (273 lb 13 oz) IBW/kg (Calculated) : 70.7 Heparin Dosing Weight: 95 kg  Vital Signs: Temp: 97.3 F (36.3 C) (08/08 2100) Temp Source: Bladder (08/08 1200) BP: 118/74 (08/08 2100) Pulse Rate: 70 (08/08 2100)  Labs: Recent Labs    07/25/20 0212 07/25/20 1815 07/25/20 1830 07/26/20 0027 07/26/20 0400 07/26/20 0400 07/26/20 1428 07/26/20 1428 07/27/20 0200 07/27/20 0400 07/27/20 1055 07/27/20 1136 07/27/20 2123  HGB   < >  --  11.9*  --  11.9*  --   --   --   --   --  10.3*  --   --   HCT   < >  --  36.3*  --  36.1*  --   --   --   --   --  33.4*  --   --   PLT   < >  --  205  --  196  --   --   --   --   --  212  --   --   APTT  --  29  --   --  38*  --   --   --   --  60*  --   --   --   LABPROT  --   --  14.2  --   --   --   --   --   --   --   --   --   --   INR  --   --  1.2  --   --   --   --   --   --   --   --   --   --   HEPARINUNFRC   < >  --   --   --  0.13*   < > 0.25*   < > 0.37  --   --  0.18* 0.58  CREATININE  --   --   --    < > 4.42*  4.16*  --  5.03*  --   --   --  4.16*  4.26*  --   --    < > = values in this interval not displayed.    Estimated Creatinine Clearance: 23.1 mL/min (A) (by C-G formula based on SCr of 4.26 mg/dL (H)).  Assessment: 64 yo male with COVID PNA, now on CRRT, D-dimer rose from 7 to 17 to start IV heparin per Rx dosing for acute R DVT peroneal vein.   Significant Events: -8/6: Pt had bradycardia, hypotension. Pt given alteplase 50 mg IV infusion over 2 hrs for PE @ 16:08 -8/6: Heparin infusion resumed @ 21:41 s/p TPA with aPTT <80  Today, 07/27/20  HL therapeutic (0.58) on current IV heparin rate of 1600 units/hr  Note that when CRRT was restarted 8/7, citrate dextrose is now being used to help prevent CRRT line from clotting vs UFH  being used previously so heparin rate will likely need to be increased to achieve goal levels which is what we are seeing  CBC: Hgb low but stable, Plt WNL and stable  Confirmed with RN that heparin infusing at correct rate with no interruptions. No signs/symptoms of bleeding.   Goal of Therapy:  HL 0.3 - 0.7  Monitor platelets by anticoagulation protocol: Yes   Plan:   Continue  IV heparin @ 1600 units/hr  Daily heparin level and CBC while on heparin  Monitor for signs/symptoms of bleeding  Everette Rank, PharmD 07/27/20 10:44 PM

## 2020-07-27 NOTE — Progress Notes (Signed)
ANTICOAGULATION CONSULT NOTE - Follow Up Consult  Pharmacy Consult for Heparin Indication: acute DVT  No Known Allergies  Patient Measurements: Height: 5\' 9"  (175.3 cm) Weight: 124.2 kg (273 lb 13 oz) IBW/kg (Calculated) : 70.7 Heparin Dosing Weight: 95 kg  Vital Signs: Temp: 98.1 F (36.7 C) (08/08 1100) Temp Source: Bladder (08/08 0800) BP: 110/59 (08/08 1100) Pulse Rate: 81 (08/08 1100)  Labs: Recent Labs    07/24/20 1316 07/24/20 1600 07/25/20 0212 07/25/20 1200 07/25/20 1600 07/25/20 1815 07/25/20 1830 07/26/20 0027 07/26/20 0400 07/26/20 0400 07/26/20 1428 07/27/20 0200 07/27/20 0400 07/27/20 1055 07/27/20 1136  HGB  --    < >   < >   < >  --   --  11.9*  --  11.9*  --   --   --   --  10.3*  --   HCT  --    < >   < >  --   --   --  36.3*  --  36.1*  --   --   --   --  33.4*  --   PLT  --    < >   < >  --   --   --  205  --  196  --   --   --   --  212  --   APTT  --    < >  --   --   --  29  --   --  38*  --   --   --  60*  --   --   LABPROT  --   --   --   --   --   --  14.2  --   --   --   --   --   --   --   --   INR  --   --   --   --   --   --  1.2  --   --   --   --   --   --   --   --   HEPARINUNFRC  --    < >  --    < >  --   --   --   --  0.13*   < > 0.25* 0.37  --   --  0.18*  CREATININE  --    < >  --   --    < >  --   --  3.83* 4.42*  4.16*  --  5.03*  --   --   --   --   TROPONINIHS 134*  --   --   --   --   --   --   --   --   --   --   --   --   --   --    < > = values in this interval not displayed.    Estimated Creatinine Clearance: 19.6 mL/min (A) (by C-G formula based on SCr of 5.03 mg/dL (H)).  Assessment: 64 yo male with COVID PNA, now on CRRT, D-dimer rose from 7 to 17 to start IV heparin per Rx dosing for acute R DVT peroneal vein.   Significant Events: -8/6: Pt had bradycardia, hypotension. Pt given alteplase 50 mg IV infusion over 2 hrs for PE @ 16:08 -8/6: Heparin infusion resumed @ 21:41 s/p TPA with aPTT <80  Today,  07/27/20  HL SUBtherapeutic on  current IV heparin rate of 1350 units/hr  Note that when CRRT was restarted 8/7, citrate dextrose is now being used to help prevent CRRT line from clotting vs UFH being used previously so heparin rate will likely need to be increased to achieve goal levels which is what we are seeing  CBC: Hgb low but stable, Plt WNL and stable  Confirmed with RN that heparin infusing at correct rate with no interruptions. No signs/symptoms of bleeding.   Goal of Therapy:  HL 0.3 - 0.5 for first 24 hrs after tPA infusion then resume target goal of 0.3-0.7 Monitor platelets by anticoagulation protocol: Yes   Plan:   Increase heparin infusion from 1350 units/hr to 1600 units/hr  Recheck heparin level in 8 hours  Daily heparin level and CBC while on heparin  Monitor for signs/symptoms of bleeding  Kara Mead, PharmD 07/27/20 12:06 PM

## 2020-07-27 NOTE — Progress Notes (Signed)
[facility] Progress Note  Subjective: HD cath pulled back 2 cm last night and working better today. I/O are inaccurate as the citrate was included when it wasn't supposed to be included. FiO2 still 50% and levo gtt down to 5 ug/min.   Vitals:   07/27/20 1114 07/27/20 1200 07/27/20 1300 07/27/20 1400  BP:  109/69 130/72   Pulse:  84 88 90  Resp:  (!) 21 (!) 28 (!) 27  Temp:  98.4 F (36.9 C) 99.3 F (37.4 C) 99.9 F (37.7 C)  TempSrc:  Bladder    SpO2: 100% 100% 100% 100%  Weight:      Height:        Exam:  pt seen in ICU  on vent, sedated  coarse BS bilat  Cor reg no RG   abd soft no ascites or hsm   Ext diffuse 1-2+ edema x 4 ext   R IJ temp HD cath    No rash or livedo   CXR 8/8 > IMPRESSION: Appliances appear in satisfactory location. Cardiac enlargement with bilateral perihilar and basilar airspace infiltrates, likely edema.   Renal US 8/2 - 13-14 cm kidneys w/o hydro, +^'d echo seen w/ CKD   UA 8/2 - >300 prot, 20-50 rbc, 6-10 wbc, 0-5 epi   UP/C ratio = e.e   UNa 32, UCr 147  Assessment/ Plan: 1. Renal failure - no old date, hx of "kidney disease" per patient at time of admission. Creat 8.4 on admit 8/2, down to 4 w/ CRRT. CRRT started 8/3. HD cath malfunction better today after cath pulled back 2 cm.  Will cont CRRT, cont citrate protocol due to severe clotting. Will ^ UF to 100- 150 cc/hr given ^^CVP's/ volume. Have d/w staff.  2. COVID PNA - w/ vent dependence, getting IV steroids/ sp remdesivir 3. Hypotension/ shock - on levo gtt and low doses today 4. Vol - wt's up 10-15kg, + mod edema on exam 5. Anemia - Hb > 10, follow 6. Suspected acute PE/ acute cor pulmonale - sp systemic TPA on 8/6 and on full dose IV systemic heparin 7. Acute RLE DVT 8. DM/ hyperglycemia - A1C was 12, on levemir 25 u bid here +SSI     Rob Camil Hausmann 07/27/2020, 2:18 PM   Recent Labs  Lab 07/26/20 0400 07/26/20 0400 07/26/20 1428 07/27/20 1055  K 3.9  3.9   < >  4.5 4.8  4.7  BUN 95*  97*   < > 109* 78*  PENDING  CREATININE 4.42*  4.16*   < > 5.03* 4.26*  4.16*  CALCIUM 7.3*  7.2*   < > 7.3* 6.8*  6.9*  PHOS 5.4*   < > 6.2* 5.1*  HGB 11.9*  --   --  10.3*   < > = values in this interval not displayed.   Inpatient medications: . vitamin C  500 mg Per Tube Daily  . chlorhexidine gluconate (MEDLINE KIT)  15 mL Mouth Rinse BID  . Chlorhexidine Gluconate Cloth  6 each Topical Daily  . docusate  100 mg Per Tube BID  . feeding supplement (PROSource TF)  90 mL Per Tube 5 X Daily  . insulin aspart  0-20 Units Subcutaneous Q4H  . insulin aspart  10 Units Subcutaneous Q4H  . insulin detemir  25 Units Subcutaneous BID  . mouth rinse  15 mL Mouth Rinse 10 times per day  . pantoprazole (PROTONIX) IV  40 mg Intravenous Daily  . polyethylene glycol  17 g Per Tube Daily  . predniSONE  50 mg Oral Q breakfast  . sodium chloride flush  3 mL Intravenous Q12H  . zinc sulfate  220 mg Per Tube Daily   .  prismasol BGK 4/2.5 400 mL/hr at 07/27/20 0128  . calcium gluconate infusion for CRRT 60 mL/hr at 07/27/20 0218  . citrate dextrose 1,000 mL (07/27/20 1356)  . DOPamine Stopped (07/25/20 2244)  . feeding supplement (PIVOT 1.5 CAL) 1,000 mL (07/26/20 1430)  . fentaNYL infusion INTRAVENOUS 150 mcg/hr (07/27/20 1400)  . heparin 1,600 Units/hr (07/27/20 1400)  . norepinephrine (LEVOPHED) Adult infusion 5 mcg/min (07/27/20 1400)  . dialysate solution for CRRT (citrate protocol) 2,500 mL/hr at 07/27/20 1308  . propofol (DIPRIVAN) infusion 30 mcg/kg/min (07/27/20 1408)  . vasopressin Stopped (07/24/20 2304)   acetaminophen, alteplase, EPINEPHrine, fentaNYL, heparin, heparin, midazolam, midazolam, ondansetron **OR** ondansetron (ZOFRAN) IV, sennosides, sodium chloride

## 2020-07-27 NOTE — Progress Notes (Signed)
This RN noted >6L IV piggback intake charted at 0600. Review of overnight intake and output flowsheet revealed that citrate had been documented as intake (6535.5 mL). This resulted in the fluid balance being skewed significantly higher than the actual volume (18.5L vs 10.2L). A small amount of additional output (37mL) was documented between 0300-0700 in the CRRT flowsheet, but was not reflected in the intake and output flowsheet, once again resulting in an inaccurate fluid balance. Patient is currently tolerating fluid removal. Dr. Carlis Abbott & Dr. Jonnie Finner made aware of overnight events.

## 2020-07-27 NOTE — Progress Notes (Signed)
NAME:  Alex [name], MRN:  [id number], DOB:  1956/04/19, LOS: 7 ADMISSION DATE:  08/15/2020, CONSULTATION DATE:  8/1 REFERRING MD:  Sedonia Small, CHIEF COMPLAINT:  Dyspnea   Brief History   64 y/o male with acute hypoxemic respiratory failure due to COVID 19 pneumonia admitted on 8/1  Past Medical History  Cigarette smoker DM2 Hypertension CKD?  Significant Hospital Events   8/1 admission 8/2 intubation, start CRRT 8/5 significant improvement in oxygenation; mid morning developed severe sudden shock of uncertain etiology, low grade temp  Consults:  Nephrology  Procedures:  8/2 ETT >  8/2 L IJ CVL >  8/3 R IJ HD cath >   Significant Diagnostic Tests:  8/2 lower ext doppler/vascular ultrasound >> acute R DVT peroneal vein, left negative 8/2 renal ultrasound >> no hydro, findings consistent with medicorenal disease  Micro Data:  8/1 SARS COV 2 > positive 8/1 blood >   8/5 blood >  8/5 resp >   Antimicrobials/COVID Rx:  8/1 ceftriaxone > 8/5 8/1 remdesivir >  8/1 tocilizumab   Interim history/subjective:   Dialysis catheter pulled back overnight due to low flows, now running well. Spent most of the previous 24 hours off CRRT.  Objective   Blood pressure 100/63, pulse 82, temperature 99.3 F (37.4 C), temperature source Bladder, resp. rate (!) 22, height 5\' 9"  (1.753 m), weight 124.2 kg, SpO2 100 %. CVP:  [5 mmHg-23 mmHg] 20 mmHg  Vent Mode: PRVC FiO2 (%):  [50 %] 50 % Set Rate:  [25 bmp] 25 bmp Vt Set:  [560 mL] 560 mL PEEP:  [14 cmH20] 14 cmH20 Plateau Pressure:  [26 cmH20-35 cmH20] 34 cmH20   Intake/Output Summary (Last 24 hours) at 07/27/2020 0808 Last data filed at 07/27/2020 0800 Gross per 24 hour  Intake 10611.01 ml  Output 705 ml  Net 9906.01 ml   Filed Weights   07/23/20 0500 07/24/20 0500 07/27/20 0427  Weight: 113.4 kg 112.5 kg 124.2 kg    Examination: General: critically- ill appearing man intubated, sedated HENT: [place]/AT, eyes anicteric PULM:  breathing comfortably on the vent, no secretions from ETT. CV: RRR, no murmurs GI: obese, soft, NT MSK: LE edema, no clubbing or cyanosis Neuro: RASS -5, PERRL, no withdrawal from pain  8/8 CXR personally reviewed-persistent bilateral opacities, ET tube and dialysis catheter in appropriate position.  Resolved Hospital Problem list     Assessment & Plan:  ARDS due to COVID 19 pneumonia > worsening oxygenation on 8/6 Acute pulmonary edema due to acute renal failure Likely acute massive PE with acute cor pulmonale-> s/p systemic TPA on 8/6. -Con't LTVV, plateau pressure at goal. -Daily SAT and SBT when appropriate- O2 requirements too high for SBT.  Having increased respiratory efforts today.  Oxygen requirements remain too high for SBT. SAT when oxygen requirements improve. -Net negative on CRRT today; tolerating restarting. Keep circuit anticoagulated. -Continue steroids -Okay to discontinue broad-spectrum given suspicion for alternative diagnosis of massive PE  Severe shock midmorning 8/5 Periodic bradycardia> no clear source, possibly due to hypothermia -D/c empiric antibiotics.  Continue to follow cultures until finalized.  -Vasopressors as required to maintain MAP greater than 65 -Continue anticoagulation  Acute DVT R leg -Continue heparin infusion  AKI with hyperkalemia BPH Metabolic acidosis-stable -CRRT per nephrology -Strict I's/O -Renally dose meds and avoid nephrotoxic meds  Hyperglycemia-better controlled today -Continue Levemir 25 units twice daily -Sliding scale insulin as needed -Will be drawn 140-180 while admitted to the ICU  Need for sedation for  mechanical ventilation -RASS target: -1 -Continue versed prn, fentanyl infusion per PAD protocol   Best practice:  Diet: NPO Pain/Anxiety/Delirium protocol (if indicated): n/a VAP protocol (if indicated): n/a DVT prophylaxis: heparin infusion GI prophylaxis: n/a Glucose control: basal bolus  insulin Mobility: bed rest Code Status: full Family Communication: brother Herbie [place] updated 8/8 Disposition: remain in ICU  Labs   CBC: Recent Labs  Lab 07/22/20 0350 07/22/20 0350 07/23/20 0210 07/23/20 0210 07/24/20 0359 07/24/20 0359 07/24/20 1202 07/24/20 1202 07/24/20 1217 07/24/20 1223 07/25/20 0212 07/25/20 1830 07/26/20 0400  WBC 6.6   < > 9.9   < > 12.1*  --  33.6*  --   --   --  15.6* 19.2* 21.3*  NEUTROABS 5.8  --  8.6*  --  10.4*  --   --   --   --   --  12.8*  --  16.9*  HGB 13.8   < > 12.8*   < > 14.0   < > 13.8   < > 15.0 16.3 12.1* 11.9* 11.9*  HCT 42.8   < > 38.9*   < > 43.0   < > 44.7   < > 44.0 48.0 35.2* 36.3* 36.1*  MCV 87.0   < > 86.6   < > 87.6  --  91.8  --   --   --  82.8 85.8 86.6  PLT 242   < > 218   < > 174  --  238  --   --   --  157 205 196   < > = values in this interval not displayed.    Basic Metabolic Panel: Recent Labs  Lab 07/22/20 2100 07/22/20 2100 07/23/20 0210 07/23/20 1600 07/24/20 0359 07/24/20 1202 07/25/20 0212 07/25/20 1600 07/26/20 0027 07/26/20 0400 07/26/20 1428  NA 138   < > 136  137   < > 133*  133*   < > 135  135 140 137 136  135 134*  K 4.1   < > 4.2  4.3   < > 4.4  4.3   < > 3.5  3.5 3.9 4.5 3.9  3.9 4.5  CL 103   < > 99  100   < > 97*  96*   < > 92*  92* 97* 94* 92*  92* 92*  CO2 23   < > 27  25   < > 26  25   < > 31  30 32 27 25  26 25   GLUCOSE 239*   < > 255*  256*   < > 252*  246*   < > 212*  216* 153* 205* 217*  217* 308*  BUN 65*   < > 59*  59*   < > 47*  47*   < > 61*  60* 72* 85* 95*  97* 109*  CREATININE 3.89*   < > 3.46*  3.44*   < > 2.57*  2.65*   < > 2.85*  2.71* 3.11* 3.83* 4.42*  4.16* 5.03*  CALCIUM 6.5*   < > 6.9*  6.9*   < > 7.4*  7.5*   < > 6.5*  6.5* 6.9* 7.3* 7.3*  7.2* 7.3*  MG 2.3  --  2.4  --  2.6*  --  2.3  --   --  2.4  --   PHOS 4.8*  4.9*   < > 4.7*  4.9*   < > 3.5  3.6   < > 2.5  2.6  4.9* 6.0* 5.4* 6.2*   < > = values in this interval not  displayed.   GFR: Estimated Creatinine Clearance: 19.6 mL/min (A) (by C-G formula based on SCr of 5.03 mg/dL (H)). Recent Labs  Lab 08/17/2020 1248 07/30/2020 1624 07/24/20 0359 07/24/20 1130 07/24/20 1202 07/24/20 1928 07/25/20 0212 07/25/20 1830 07/25/20 2106 07/26/20 0400  PROCALCITON 4.75  --   --   --   --   --   --   --   --   --   WBC 5.6   < >   < >  --  33.6*  --  15.6* 19.2*  --  21.3*  LATICACIDVEN 2.1*   < >  --  8.4* >11.0* 3.6*  --   --  2.6*  --    < > = values in this interval not displayed.    Liver Function Tests: Recent Labs  Lab 07/23/20 0210 07/23/20 1600 07/24/20 0359 07/24/20 0359 07/24/20 1202 07/24/20 1600 07/25/20 0212 07/25/20 1600 07/26/20 0027 07/26/20 0400 07/26/20 1428  AST 55*  --  85*  --  163*  --  184*  --   --  105*  --   ALT 28  --  38  --  91*  --  116*  --   --  103*  --   ALKPHOS 64  --  78  --  85  --  71  --   --  75  --   BILITOT 0.4  --  0.3  --  0.5  --  0.3  --   --  0.6  --   PROT 5.4*  --  6.0*  --  5.2*  --  4.7*  --   --  4.6*  --   ALBUMIN 2.0*  2.0*   < > 2.2*  2.3*   < > 1.9*   < > 1.7*  1.7* 1.9* 2.0* 1.9*  1.9* 1.9*   < > = values in this interval not displayed.   No results for input(s): LIPASE, AMYLASE in the last 168 hours. No results for input(s): AMMONIA in the last 168 hours.  ABG    Component Value Date/Time   PHART 7.541 (H) 07/25/2020 1352   PCO2ART 34.4 07/25/2020 1352   PO2ART 63.8 (L) 07/25/2020 1352   HCO3 29.7 (H) 07/25/2020 1352   TCO2 17 (L) 07/24/2020 1223   ACIDBASEDEF 15.0 (H) 07/24/2020 1217   O2SAT 94.6 07/25/2020 1352     Coagulation Profile: Recent Labs  Lab 07/21/20 2301 07/25/20 1830  INR 1.3* 1.2    Cardiac Enzymes: Recent Labs  Lab 07/24/20 1202  CKTOTAL 2,124*  CKMB 14.0*    HbA1C: Hgb A1c MFr Bld  Date/Time Value Ref Range Status  07/23/2020 09:36 AM 12.3 (H) 4.8 - 5.6 % Final    Comment:    (NOTE) Pre diabetes:          5.7%-6.4%  Diabetes:               >6.4%  Glycemic control for   <7.0% adults with diabetes     CBG: Recent Labs  Lab 07/26/20 1530 07/26/20 1923 07/26/20 2334 07/27/20 0334 07/27/20 0728  GLUCAP 262* 252* 203* 209* 157*      This patient is critically ill with multiple organ system failure which requires frequent high complexity decision making, assessment, support, evaluation, and titration of therapies. This was completed through the application of advanced monitoring technologies and extensive interpretation of multiple  databases. During this encounter critical care time was devoted to patient care services described in this note for 36 minutes.  Julian Hy, DO 07/27/20 1:42 PM [facility]

## 2020-07-27 NOTE — Procedures (Signed)
   I was present at this CVVHD session, have reviewed the session itself and made  appropriate changes Kelly Splinter MD [place] pager [phone]   07/27/2020, 2:34 PM

## 2020-07-27 NOTE — Progress Notes (Signed)
Asked to evaluate for dialysis catheter will draw back with a syringe but does not allow enough fluid to run CRRT.  I pulled the catheter back under sterile conditions as possible 2 cm.  Repeat chest x-ray shows continued adequate position of the dialysis catheter.  Nursing will try to restart CRRT.

## 2020-07-28 DIAGNOSIS — N179 Acute kidney failure, unspecified: Secondary | ICD-10-CM | POA: Diagnosis not present

## 2020-07-28 DIAGNOSIS — U071 COVID-19: Secondary | ICD-10-CM | POA: Diagnosis not present

## 2020-07-28 DIAGNOSIS — J9601 Acute respiratory failure with hypoxia: Secondary | ICD-10-CM | POA: Diagnosis not present

## 2020-07-28 LAB — POCT I-STAT, CHEM 8
BUN: >130 mg/dL — ABNORMAL HIGH (ref 8–23)
BUN: 100 mg/dL — ABNORMAL HIGH (ref 8–23)
BUN: 128 mg/dL — ABNORMAL HIGH (ref 8–23)
BUN: 98 mg/dL — ABNORMAL HIGH (ref 8–23)
BUN: 88 mg/dL — ABNORMAL HIGH (ref 8–23)
BUN: >130 mg/dL — ABNORMAL HIGH (ref 8–23)
BUN: 116 mg/dL — ABNORMAL HIGH (ref 8–23)
BUN: 86 mg/dL — ABNORMAL HIGH (ref 8–23)
BUN: 110 mg/dL — ABNORMAL HIGH (ref 8–23)
BUN: 81 mg/dL — ABNORMAL HIGH (ref 8–23)
BUN: 63 mg/dL — ABNORMAL HIGH (ref 8–23)
BUN: 99 mg/dL — ABNORMAL HIGH (ref 8–23)
BUN: 56 mg/dL — ABNORMAL HIGH (ref 8–23)
BUN: 76 mg/dL — ABNORMAL HIGH (ref 8–23)
BUN: 78 mg/dL — ABNORMAL HIGH (ref 8–23)
BUN: 47 mg/dL — ABNORMAL HIGH (ref 8–23)
BUN: 67 mg/dL — ABNORMAL HIGH (ref 8–23)
BUN: 39 mg/dL — ABNORMAL HIGH (ref 8–23)
BUN: 54 mg/dL — ABNORMAL HIGH (ref 8–23)
BUN: 41 mg/dL — ABNORMAL HIGH (ref 8–23)
Calcium, Ion: 0.95 mmol/L — ABNORMAL LOW (ref 1.15–1.40)
Calcium, Ion: 0.4 mmol/L — CL (ref 1.15–1.40)
Calcium, Ion: 0.39 mmol/L — CL (ref 1.15–1.40)
Calcium, Ion: 0.99 mmol/L — ABNORMAL LOW (ref 1.15–1.40)
Calcium, Ion: 0.57 mmol/L — CL (ref 1.15–1.40)
Calcium, Ion: 1.01 mmol/L — ABNORMAL LOW (ref 1.15–1.40)
Calcium, Ion: 0.3 mmol/L — CL (ref 1.15–1.40)
Calcium, Ion: 0.93 mmol/L — ABNORMAL LOW (ref 1.15–1.40)
Calcium, Ion: 0.37 mmol/L — CL (ref 1.15–1.40)
Calcium, Ion: 0.97 mmol/L — ABNORMAL LOW (ref 1.15–1.40)
Calcium, Ion: 0.33 mmol/L — CL (ref 1.15–1.40)
Calcium, Ion: 0.95 mmol/L — ABNORMAL LOW (ref 1.15–1.40)
Calcium, Ion: 0.33 mmol/L — CL (ref 1.15–1.40)
Calcium, Ion: 0.92 mmol/L — ABNORMAL LOW (ref 1.15–1.40)
Calcium, Ion: 0.96 mmol/L — ABNORMAL LOW (ref 1.15–1.40)
Calcium, Ion: 0.34 mmol/L — CL (ref 1.15–1.40)
Calcium, Ion: 0.96 mmol/L — ABNORMAL LOW (ref 1.15–1.40)
Calcium, Ion: 0.43 mmol/L — CL (ref 1.15–1.40)
Calcium, Ion: 1.06 mmol/L — ABNORMAL LOW (ref 1.15–1.40)
Calcium, Ion: 0.44 mmol/L — CL (ref 1.15–1.40)
Chloride: 97 mmol/L — ABNORMAL LOW (ref 98–111)
Chloride: 103 mmol/L (ref 98–111)
Chloride: 108 mmol/L (ref 98–111)
Chloride: 99 mmol/L (ref 98–111)
Chloride: 101 mmol/L (ref 98–111)
Chloride: 112 mmol/L — ABNORMAL HIGH (ref 98–111)
Chloride: 105 mmol/L (ref 98–111)
Chloride: 112 mmol/L — ABNORMAL HIGH (ref 98–111)
Chloride: 107 mmol/L (ref 98–111)
Chloride: 113 mmol/L — ABNORMAL HIGH (ref 98–111)
Chloride: 113 mmol/L — ABNORMAL HIGH (ref 98–111)
Chloride: 117 mmol/L — ABNORMAL HIGH (ref 98–111)
Chloride: 118 mmol/L — ABNORMAL HIGH (ref 98–111)
Chloride: 119 mmol/L — ABNORMAL HIGH (ref 98–111)
Chloride: 117 mmol/L — ABNORMAL HIGH (ref 98–111)
Chloride: 119 mmol/L — ABNORMAL HIGH (ref 98–111)
Chloride: 124 mmol/L — ABNORMAL HIGH (ref 98–111)
Chloride: 109 mmol/L (ref 98–111)
Chloride: 119 mmol/L — ABNORMAL HIGH (ref 98–111)
Chloride: 101 mmol/L (ref 98–111)
Creatinine, Ser: 5.6 mg/dL — ABNORMAL HIGH (ref 0.61–1.24)
Creatinine, Ser: 3.8 mg/dL — ABNORMAL HIGH (ref 0.61–1.24)
Creatinine, Ser: 3.9 mg/dL — ABNORMAL HIGH (ref 0.61–1.24)
Creatinine, Ser: 5.8 mg/dL — ABNORMAL HIGH (ref 0.61–1.24)
Creatinine, Ser: 3.6 mg/dL — ABNORMAL HIGH (ref 0.61–1.24)
Creatinine, Ser: 5.6 mg/dL — ABNORMAL HIGH (ref 0.61–1.24)
Creatinine, Ser: 3.3 mg/dL — ABNORMAL HIGH (ref 0.61–1.24)
Creatinine, Ser: 5 mg/dL — ABNORMAL HIGH (ref 0.61–1.24)
Creatinine, Ser: 3.2 mg/dL — ABNORMAL HIGH (ref 0.61–1.24)
Creatinine, Ser: 4.7 mg/dL — ABNORMAL HIGH (ref 0.61–1.24)
Creatinine, Ser: 2.7 mg/dL — ABNORMAL HIGH (ref 0.61–1.24)
Creatinine, Ser: 4.3 mg/dL — ABNORMAL HIGH (ref 0.61–1.24)
Creatinine, Ser: 2.6 mg/dL — ABNORMAL HIGH (ref 0.61–1.24)
Creatinine, Ser: 3.6 mg/dL — ABNORMAL HIGH (ref 0.61–1.24)
Creatinine, Ser: 3.6 mg/dL — ABNORMAL HIGH (ref 0.61–1.24)
Creatinine, Ser: 2.3 mg/dL — ABNORMAL HIGH (ref 0.61–1.24)
Creatinine, Ser: 3.5 mg/dL — ABNORMAL HIGH (ref 0.61–1.24)
Creatinine, Ser: 1.8 mg/dL — ABNORMAL HIGH (ref 0.61–1.24)
Creatinine, Ser: 3 mg/dL — ABNORMAL HIGH (ref 0.61–1.24)
Creatinine, Ser: 1.6 mg/dL — ABNORMAL HIGH (ref 0.61–1.24)
Glucose, Bld: 257 mg/dL — ABNORMAL HIGH (ref 70–99)
Glucose, Bld: 232 mg/dL — ABNORMAL HIGH (ref 70–99)
Glucose, Bld: 196 mg/dL — ABNORMAL HIGH (ref 70–99)
Glucose, Bld: 204 mg/dL — ABNORMAL HIGH (ref 70–99)
Glucose, Bld: 194 mg/dL — ABNORMAL HIGH (ref 70–99)
Glucose, Bld: 215 mg/dL — ABNORMAL HIGH (ref 70–99)
Glucose, Bld: 178 mg/dL — ABNORMAL HIGH (ref 70–99)
Glucose, Bld: 181 mg/dL — ABNORMAL HIGH (ref 70–99)
Glucose, Bld: 169 mg/dL — ABNORMAL HIGH (ref 70–99)
Glucose, Bld: 172 mg/dL — ABNORMAL HIGH (ref 70–99)
Glucose, Bld: 176 mg/dL — ABNORMAL HIGH (ref 70–99)
Glucose, Bld: 180 mg/dL — ABNORMAL HIGH (ref 70–99)
Glucose, Bld: 183 mg/dL — ABNORMAL HIGH (ref 70–99)
Glucose, Bld: 190 mg/dL — ABNORMAL HIGH (ref 70–99)
Glucose, Bld: 227 mg/dL — ABNORMAL HIGH (ref 70–99)
Glucose, Bld: 189 mg/dL — ABNORMAL HIGH (ref 70–99)
Glucose, Bld: 191 mg/dL — ABNORMAL HIGH (ref 70–99)
Glucose, Bld: 187 mg/dL — ABNORMAL HIGH (ref 70–99)
Glucose, Bld: 202 mg/dL — ABNORMAL HIGH (ref 70–99)
Glucose, Bld: 292 mg/dL — ABNORMAL HIGH (ref 70–99)
HCT: 31 % — ABNORMAL LOW (ref 39.0–52.0)
HCT: 37 % — ABNORMAL LOW (ref 39.0–52.0)
HCT: 29 % — ABNORMAL LOW (ref 39.0–52.0)
HCT: 32 % — ABNORMAL LOW (ref 39.0–52.0)
HCT: 29 % — ABNORMAL LOW (ref 39.0–52.0)
HCT: 37 % — ABNORMAL LOW (ref 39.0–52.0)
HCT: 31 % — ABNORMAL LOW (ref 39.0–52.0)
HCT: 32 % — ABNORMAL LOW (ref 39.0–52.0)
HCT: 30 % — ABNORMAL LOW (ref 39.0–52.0)
HCT: 35 % — ABNORMAL LOW (ref 39.0–52.0)
HCT: 31 % — ABNORMAL LOW (ref 39.0–52.0)
HCT: 40 % (ref 39.0–52.0)
HCT: 29 % — ABNORMAL LOW (ref 39.0–52.0)
HCT: 35 % — ABNORMAL LOW (ref 39.0–52.0)
HCT: 32 % — ABNORMAL LOW (ref 39.0–52.0)
HCT: 33 % — ABNORMAL LOW (ref 39.0–52.0)
HCT: 36 % — ABNORMAL LOW (ref 39.0–52.0)
HCT: 34 % — ABNORMAL LOW (ref 39.0–52.0)
HCT: 39 % (ref 39.0–52.0)
HCT: 38 % — ABNORMAL LOW (ref 39.0–52.0)
Hemoglobin: 10.5 g/dL — ABNORMAL LOW (ref 13.0–17.0)
Hemoglobin: 12.6 g/dL — ABNORMAL LOW (ref 13.0–17.0)
Hemoglobin: 10.9 g/dL — ABNORMAL LOW (ref 13.0–17.0)
Hemoglobin: 9.9 g/dL — ABNORMAL LOW (ref 13.0–17.0)
Hemoglobin: 12.6 g/dL — ABNORMAL LOW (ref 13.0–17.0)
Hemoglobin: 9.9 g/dL — ABNORMAL LOW (ref 13.0–17.0)
Hemoglobin: 10.5 g/dL — ABNORMAL LOW (ref 13.0–17.0)
Hemoglobin: 10.9 g/dL — ABNORMAL LOW (ref 13.0–17.0)
Hemoglobin: 10.2 g/dL — ABNORMAL LOW (ref 13.0–17.0)
Hemoglobin: 11.9 g/dL — ABNORMAL LOW (ref 13.0–17.0)
Hemoglobin: 10.5 g/dL — ABNORMAL LOW (ref 13.0–17.0)
Hemoglobin: 13.6 g/dL (ref 13.0–17.0)
Hemoglobin: 11.9 g/dL — ABNORMAL LOW (ref 13.0–17.0)
Hemoglobin: 9.9 g/dL — ABNORMAL LOW (ref 13.0–17.0)
Hemoglobin: 10.9 g/dL — ABNORMAL LOW (ref 13.0–17.0)
Hemoglobin: 11.2 g/dL — ABNORMAL LOW (ref 13.0–17.0)
Hemoglobin: 12.2 g/dL — ABNORMAL LOW (ref 13.0–17.0)
Hemoglobin: 11.6 g/dL — ABNORMAL LOW (ref 13.0–17.0)
Hemoglobin: 13.3 g/dL (ref 13.0–17.0)
Hemoglobin: 12.9 g/dL — ABNORMAL LOW (ref 13.0–17.0)
Potassium: 4.3 mmol/L (ref 3.5–5.1)
Potassium: 3.9 mmol/L (ref 3.5–5.1)
Potassium: 3.7 mmol/L (ref 3.5–5.1)
Potassium: 4.1 mmol/L (ref 3.5–5.1)
Potassium: 3.6 mmol/L (ref 3.5–5.1)
Potassium: 4.2 mmol/L (ref 3.5–5.1)
Potassium: 3.8 mmol/L (ref 3.5–5.1)
Potassium: 4.2 mmol/L (ref 3.5–5.1)
Potassium: 3.8 mmol/L (ref 3.5–5.1)
Potassium: 4.4 mmol/L (ref 3.5–5.1)
Potassium: 4.2 mmol/L (ref 3.5–5.1)
Potassium: 5 mmol/L (ref 3.5–5.1)
Potassium: 4.5 mmol/L (ref 3.5–5.1)
Potassium: 5 mmol/L (ref 3.5–5.1)
Potassium: 5.9 mmol/L — ABNORMAL HIGH (ref 3.5–5.1)
Potassium: 4.6 mmol/L (ref 3.5–5.1)
Potassium: 5.7 mmol/L — ABNORMAL HIGH (ref 3.5–5.1)
Potassium: 3.7 mmol/L (ref 3.5–5.1)
Potassium: 5 mmol/L (ref 3.5–5.1)
Potassium: 3.7 mmol/L (ref 3.5–5.1)
Sodium: 133 mmol/L — ABNORMAL LOW (ref 135–145)
Sodium: 142 mmol/L (ref 135–145)
Sodium: 135 mmol/L (ref 135–145)
Sodium: 143 mmol/L (ref 135–145)
Sodium: 136 mmol/L (ref 135–145)
Sodium: 141 mmol/L (ref 135–145)
Sodium: 142 mmol/L (ref 135–145)
Sodium: 149 mmol/L — ABNORMAL HIGH (ref 135–145)
Sodium: 138 mmol/L (ref 135–145)
Sodium: 146 mmol/L — ABNORMAL HIGH (ref 135–145)
Sodium: 140 mmol/L (ref 135–145)
Sodium: 147 mmol/L — ABNORMAL HIGH (ref 135–145)
Sodium: 143 mmol/L (ref 135–145)
Sodium: 148 mmol/L — ABNORMAL HIGH (ref 135–145)
Sodium: 143 mmol/L (ref 135–145)
Sodium: 144 mmol/L (ref 135–145)
Sodium: 149 mmol/L — ABNORMAL HIGH (ref 135–145)
Sodium: 145 mmol/L (ref 135–145)
Sodium: 145 mmol/L (ref 135–145)
Sodium: 141 mmol/L (ref 135–145)
TCO2: 21 mmol/L — ABNORMAL LOW (ref 22–32)
TCO2: 17 mmol/L — ABNORMAL LOW (ref 22–32)
TCO2: 17 mmol/L — ABNORMAL LOW (ref 22–32)
TCO2: 21 mmol/L — ABNORMAL LOW (ref 22–32)
TCO2: 16 mmol/L — ABNORMAL LOW (ref 22–32)
TCO2: 22 mmol/L (ref 22–32)
TCO2: 15 mmol/L — ABNORMAL LOW (ref 22–32)
TCO2: 19 mmol/L — ABNORMAL LOW (ref 22–32)
TCO2: 15 mmol/L — ABNORMAL LOW (ref 22–32)
TCO2: 17 mmol/L — ABNORMAL LOW (ref 22–32)
TCO2: 12 mmol/L — ABNORMAL LOW (ref 22–32)
TCO2: 16 mmol/L — ABNORMAL LOW (ref 22–32)
TCO2: 12 mmol/L — ABNORMAL LOW (ref 22–32)
TCO2: 14 mmol/L — ABNORMAL LOW (ref 22–32)
TCO2: 15 mmol/L — ABNORMAL LOW (ref 22–32)
TCO2: 12 mmol/L — ABNORMAL LOW (ref 22–32)
TCO2: 15 mmol/L — ABNORMAL LOW (ref 22–32)
TCO2: 15 mmol/L — ABNORMAL LOW (ref 22–32)
TCO2: 20 mmol/L — ABNORMAL LOW (ref 22–32)
TCO2: 22 mmol/L (ref 22–32)

## 2020-07-28 LAB — RENAL FUNCTION PANEL
Albumin: 2.1 g/dL — ABNORMAL LOW (ref 3.5–5.0)
Albumin: 2.1 g/dL — ABNORMAL LOW (ref 3.5–5.0)
Anion gap: 11 (ref 5–15)
Anion gap: 9 (ref 5–15)
BUN: 67 mg/dL — ABNORMAL HIGH (ref 8–23)
BUN: 59 mg/dL — ABNORMAL HIGH (ref 8–23)
CO2: 13 mmol/L — ABNORMAL LOW (ref 22–32)
CO2: 21 mmol/L — ABNORMAL LOW (ref 22–32)
Calcium: 6.5 mg/dL — ABNORMAL LOW (ref 8.9–10.3)
Calcium: 7.9 mg/dL — ABNORMAL LOW (ref 8.9–10.3)
Chloride: 113 mmol/L — ABNORMAL HIGH (ref 98–111)
Chloride: 112 mmol/L — ABNORMAL HIGH (ref 98–111)
Creatinine, Ser: 3.1 mg/dL — ABNORMAL HIGH (ref 0.61–1.24)
Creatinine, Ser: 2.78 mg/dL — ABNORMAL HIGH (ref 0.61–1.24)
GFR calc Af Amer: 24 mL/min — ABNORMAL LOW (ref >60)
GFR calc Af Amer: 27 mL/min — ABNORMAL LOW (ref >60)
GFR calc non Af Amer: 20 mL/min — ABNORMAL LOW (ref >60)
GFR calc non Af Amer: 23 mL/min — ABNORMAL LOW (ref >60)
Glucose, Bld: 213 mg/dL — ABNORMAL HIGH (ref 70–99)
Glucose, Bld: 259 mg/dL — ABNORMAL HIGH (ref 70–99)
Phosphorus: 5.1 mg/dL — ABNORMAL HIGH (ref 2.5–4.6)
Phosphorus: 4.7 mg/dL — ABNORMAL HIGH (ref 2.5–4.6)
Potassium: 5.5 mmol/L — ABNORMAL HIGH (ref 3.5–5.1)
Potassium: 5 mmol/L (ref 3.5–5.1)
Sodium: 137 mmol/L (ref 135–145)
Sodium: 142 mmol/L (ref 135–145)

## 2020-07-28 LAB — CBC
HCT: 35.4 % — ABNORMAL LOW (ref 39.0–52.0)
Hemoglobin: 10.4 g/dL — ABNORMAL LOW (ref 13.0–17.0)
MCH: 28.2 pg (ref 26.0–34.0)
MCHC: 29.4 g/dL — ABNORMAL LOW (ref 30.0–36.0)
MCV: 95.9 fL (ref 80.0–100.0)
Platelets: 231 10*3/uL (ref 150–400)
RBC: 3.69 MIL/uL — ABNORMAL LOW (ref 4.22–5.81)
RDW: 15.3 % (ref 11.5–15.5)
WBC: 23.3 10*3/uL — ABNORMAL HIGH (ref 4.0–10.5)
nRBC: 2.6 % — ABNORMAL HIGH (ref 0.0–0.2)

## 2020-07-28 LAB — HEPARIN LEVEL (UNFRACTIONATED)
Heparin Unfractionated: 0.73 IU/mL — ABNORMAL HIGH (ref 0.30–0.70)
Heparin Unfractionated: 0.68 IU/mL (ref 0.30–0.70)

## 2020-07-28 LAB — GLUCOSE, CAPILLARY
Glucose-Capillary: 183 mg/dL — ABNORMAL HIGH (ref 70–99)
Glucose-Capillary: 184 mg/dL — ABNORMAL HIGH (ref 70–99)
Glucose-Capillary: 174 mg/dL — ABNORMAL HIGH (ref 70–99)
Glucose-Capillary: 237 mg/dL — ABNORMAL HIGH (ref 70–99)
Glucose-Capillary: 266 mg/dL — ABNORMAL HIGH (ref 70–99)

## 2020-07-28 LAB — APTT: aPTT: 66 seconds — ABNORMAL HIGH (ref 24–36)

## 2020-07-28 LAB — MAGNESIUM: Magnesium: 2.1 mg/dL (ref 1.7–2.4)

## 2020-07-28 MED ORDER — SODIUM CHLORIDE 0.9 % IV SOLN: INTRAVENOUS | Status: DC | PRN

## 2020-07-28 MED ORDER — PRISMASOL BGK 0/2.5 32-2.5 MEQ/L IV SOLN
INTRAVENOUS | Status: DC
  Filled 2020-07-28 (×16): qty 5000

## 2020-07-28 MED ORDER — HEPARIN (PORCINE) 25000 UT/250ML-% IV SOLN
1500.0000 [IU]/h | INTRAVENOUS | Status: DC
  Administered 2020-07-28 – 2020-07-29 (×2): 1500 [IU]/h via INTRAVENOUS
  Filled 2020-07-28 (×3): qty 250

## 2020-07-28 NOTE — Progress Notes (Signed)
ANTICOAGULATION CONSULT NOTE - Follow Up Consult  Pharmacy Consult for Heparin Indication: acute DVT  No Known Allergies  Patient Measurements: Height: 5\' 9"  (175.3 cm) Weight: 120 kg (264 lb 8.8 oz) IBW/kg (Calculated) : 70.7 Heparin Dosing Weight: 95 kg  Vital Signs: Temp: 98.2 F (36.8 C) (08/09 1600) BP: 90/55 (08/09 1600) Pulse Rate: 90 (08/09 1600)  Labs: Recent Labs    07/25/20 1830 07/26/20 0027 07/26/20 0400 07/26/20 1428 07/27/20 0400 07/27/20 0632 07/27/20 1055 07/27/20 1136 07/27/20 2123 07/27/20 2133 07/28/20 0134 07/28/20 0134 07/28/20 0209 07/28/20 0409 07/28/20 1546  HGB 11.9*  --  11.9*   < >  --    < > 10.3*   < >  --    < > 11.2*   < > 12.2* 10.4*  --   HCT 36.3*  --  36.1*   < >  --    < > 33.4*   < >  --    < > 33.0*  --  36.0* 35.4*  --   PLT 205  --  196  --   --   --  212  --   --   --   --   --   --  231  --   APTT  --   --  38*  --  60*  --   --   --   --   --   --   --   --  66*  --   LABPROT 14.2  --   --   --   --   --   --   --   --   --   --   --   --   --   --   INR 1.2  --   --   --   --   --   --   --   --   --   --   --   --   --   --   HEPARINUNFRC  --   --  0.13*   < >  --   --   --    < > 0.58  --   --   --   --  0.73* 0.68  CREATININE  --    < > 4.42*  4.16*   < >  --    < > 4.16*  4.26*   < >  --    < > 3.50*   < > 2.30* 3.10* 2.78*   < > = values in this interval not displayed.    Estimated Creatinine Clearance: 34.8 mL/min (A) (by C-G formula based on SCr of 2.78 mg/dL (H)).  Assessment: 64 yo male with COVID PNA, now on CRRT, D-dimer rose from 7 to 17 to start IV heparin per Rx dosing for acute R DVT peroneal vein.   Significant Events: -8/6: Pt had bradycardia, hypotension. Pt given alteplase 50 mg IV infusion over 2 hrs for PE @ 16:08 -8/6: Heparin infusion resumed @ 21:41 s/p TPA with aPTT <80  Today, 07/28/20  PM heparin level is therapeutic (0.68) after IV heparin rate decreased to 1500 units/hr  Note that  when CRRT was restarted 8/7, citrate dextrose is now being used to help prevent CRRT line from clotting vs UFH being used previously so heparin rate will likely need to be increased to achieve goal levels   CBC: Hgb low and decreasing, Plt WNL and stable  No signs/symptoms  of bleeding.   Goal of Therapy:  HL 0.3 - 0.7  Monitor platelets by anticoagulation protocol: Yes   Plan:   continue IV heparin at 1500 units/hr  Daily heparin level and CBC while on heparin  Monitor for signs/symptoms of bleeding   Eudelia Bunch, Pharm.D 07/28/2020 5:04 PM

## 2020-07-28 NOTE — Progress Notes (Signed)
ANTICOAGULATION CONSULT NOTE - Follow Up Consult  Pharmacy Consult for Heparin Indication: acute DVT  No Known Allergies  Patient Measurements: Height: 5\' 9"  (175.3 cm) Weight: 120 kg (264 lb 8.8 oz) IBW/kg (Calculated) : 70.7 Heparin Dosing Weight: 95 kg  Vital Signs: Temp: 98.6 F (37 C) (08/09 0600) BP: 116/69 (08/09 0600) Pulse Rate: 87 (08/09 0600)  Labs: Recent Labs    07/25/20 1830 07/26/20 0027 07/26/20 0400 07/26/20 1428 07/27/20 0200 07/27/20 0400 07/27/20 0632 07/27/20 1055 07/27/20 1136 07/27/20 1243 07/27/20 2123 07/27/20 2133 07/28/20 0134 07/28/20 0134 07/28/20 0209 07/28/20 0409  HGB 11.9*  --  11.9*   < >  --   --    < > 10.3*  --    < >  --    < > 11.2*   < > 12.2* 10.4*  HCT 36.3*  --  36.1*   < >  --   --    < > 33.4*  --    < >  --    < > 33.0*  --  36.0* 35.4*  PLT 205  --  196  --   --   --   --  212  --   --   --   --   --   --   --  231  APTT  --   --  38*  --   --  60*  --   --   --   --   --   --   --   --   --  66*  LABPROT 14.2  --   --   --   --   --   --   --   --   --   --   --   --   --   --   --   INR 1.2  --   --   --   --   --   --   --   --   --   --   --   --   --   --   --   HEPARINUNFRC  --   --  0.13*   < >   < >  --   --   --  0.18*  --  0.58  --   --   --   --  0.73*  CREATININE  --    < > 4.42*  4.16*   < >  --   --    < > 4.16*  4.26*  --    < >  --    < > 3.50*  --  2.30* 3.10*   < > = values in this interval not displayed.    Estimated Creatinine Clearance: 31.2 mL/min (A) (by C-G formula based on SCr of 3.1 mg/dL (H)).  Assessment: 64 yo male with COVID PNA, now on CRRT, D-dimer rose from 7 to 17 to start IV heparin per Rx dosing for acute R DVT peroneal vein.   Significant Events: -8/6: Pt had bradycardia, hypotension. Pt given alteplase 50 mg IV infusion over 2 hrs for PE @ 16:08 -8/6: Heparin infusion resumed @ 21:41 s/p TPA with aPTT <80  Today, 07/28/20  HL slightly supratherapeutic (0.73) on IV heparin  rate of 1600 units/hr  Note that when CRRT was restarted 8/7, citrate dextrose is now being used to help prevent CRRT line from clotting vs UFH being used previously so heparin rate  will likely need to be increased to achieve goal levels   CBC: Hgb low and decreasing, Plt WNL and stable  No signs/symptoms of bleeding.   Goal of Therapy:  HL 0.3 - 0.7  Monitor platelets by anticoagulation protocol: Yes   Plan:   Decrease IV heparin to 1500 units/hr  Daily heparin level and CBC while on heparin  Monitor for signs/symptoms of bleeding  Peggyann Juba, PharmD, BCPS Pharmacy: [phone] 07/28/20 7:11 AM

## 2020-07-28 NOTE — Progress Notes (Signed)
[facility] Progress Note  Subjective: I/O yest net - 1.2 L, K+ up at 5.5, B/Cr down 67 and 3.190, CO2 13.  CVP 5- 14 today. Levo gtt stable at 4 ug/m  Vitals:   07/28/20 0600 07/28/20 0700 07/28/20 0800 07/28/20 0811  BP: 116/69 (!) 110/51 (!) 117/58   Pulse: 87 85 88   Resp: (!) 24 (!) 21 (!) 26   Temp: 98.6 F (37 C) 98.4 F (36.9 C) 98.1 F (36.7 C)   TempSrc:      SpO2: 100% 100% 100% 100%  Weight:      Height:        Exam:  Patient not examined today directly given COVID-19 + status, utilizing data taken from chart +/- discussions w/ providers and staff.     CXR 8/8 > IMPRESSION: Appliances appear in satisfactory location. Cardiac enlargement with bilateral perihilar and basilar airspace infiltrates, likely edema.   Renal US 8/2 - 13-14 cm kidneys w/o hydro, +^'d echo seen w/ CKD   UA 8/2 - >300 prot, 20-50 rbc, 6-10 wbc, 0-5 epi   UP/C ratio = e.e   UNa 32, UCr 147  Assessment/ Plan: 1. Renal failure - no old data, hx of "kidney disease" per patient at time of admission. Creat 8.4 on admit 8/2, +proteinuria 3 gm and microhematuria on admit. Renal US normal size kidneys w/ ^'d echo seen w/ chronic disease.  CRRT started 8/3. HD cath malfunction resolved w/ repositioning 8/7, working well now. Cont citrate protocol due to severe clotting. Volume up and CVP up, cont UF 100- 200 ml/ hr as tolerated, remains on low dose levo gtt only.  2. Hyperkalemia - will change to 2 K+ dialysate 3. Metabolic acidosis - CO2 13, persistent, will change dialysate to usual bicarb for now w/ 2K as above 4. COVID PNA - w/ vent dependence, sp IV steroids/ remdesivir, on pred 50/d po now.  5. Hypotension/ shock - on levo gtt and low doses today 6. Vol - wt's up 10-15kg, + mod edema on exam 7. Anemia - Hb > 10, follow 8. Suspected acute PE w/ cor pulmonale - sp systemic TPA on 8/6 and on getting full dose IV systemic heparin 9. Acute RLE DVT - as above 10. DM/ hyperglycemia - A1C  was 12, on levemir 25 u bid here +SSI     Kelly Splinter 07/28/2020, 8:35 AM   Recent Labs  Lab 07/27/20 1055 07/27/20 1243 07/28/20 0209 07/28/20 0409  K 4.7  4.8   < > 4.6 5.5*  BUN 78*  78*   < > 47* 67*  CREATININE 4.16*  4.26*   < > 2.30* 3.10*  CALCIUM 6.9*  6.8*  --   --  6.5*  PHOS 5.1*  --   --  5.1*  HGB 10.3*   < > 12.2* 10.4*   < > = values in this interval not displayed.   Inpatient medications: . vitamin C  500 mg Per Tube Daily  . chlorhexidine gluconate (MEDLINE KIT)  15 mL Mouth Rinse BID  . Chlorhexidine Gluconate Cloth  6 each Topical Daily  . docusate  100 mg Per Tube BID  . feeding supplement (PROSource TF)  90 mL Per Tube 5 X Daily  . insulin aspart  0-20 Units Subcutaneous Q4H  . insulin aspart  10 Units Subcutaneous Q4H  . insulin detemir  25 Units Subcutaneous BID  . mouth rinse  15 mL Mouth Rinse 10 times per day  .  pantoprazole (PROTONIX) IV  40 mg Intravenous Daily  . polyethylene glycol  17 g Per Tube Daily  . predniSONE  50 mg Oral Q breakfast  . sodium chloride flush  3 mL Intravenous Q12H  . zinc sulfate  220 mg Per Tube Daily   .  prismasol BGK 4/2.5 400 mL/hr at 07/28/20 0449  . sodium chloride 10 mL/hr at 07/28/20 0800  . calcium gluconate infusion for CRRT 20 g (07/27/20 2028)  . citrate dextrose 1,000 mL (07/27/20 2040)  . DOPamine Stopped (07/25/20 2244)  . feeding supplement (PIVOT 1.5 CAL) 45 mL/hr at 07/27/20 2000  . fentaNYL infusion INTRAVENOUS 200 mcg/hr (07/28/20 0800)  . heparin 1,500 Units/hr (07/28/20 0722)  . norepinephrine (LEVOPHED) Adult infusion 4 mcg/min (07/28/20 0800)  . dialysate solution for CRRT (citrate protocol) 2,500 mL/hr at 07/28/20 0833  . propofol (DIPRIVAN) infusion 45 mcg/kg/min (07/28/20 0831)  . vasopressin Stopped (07/24/20 2304)   sodium chloride, acetaminophen, alteplase, EPINEPHrine, fentaNYL, heparin, heparin, midazolam, ondansetron **OR** ondansetron (ZOFRAN) IV, sennosides, sodium  chloride

## 2020-07-28 NOTE — Progress Notes (Signed)
NAME:  Alex [name], MRN:  [id number], DOB:  May 15, 1956, LOS: 8 ADMISSION DATE:  07/31/2020, CONSULTATION DATE:  8/1 REFERRING MD:  Sedonia Small, CHIEF COMPLAINT:  Dyspnea   Brief History   64 y/o male with acute hypoxemic respiratory failure due to COVID 19 pneumonia admitted on 8/1  Past Medical History  Cigarette smoker DM2 Hypertension CKD?  Significant Hospital Events   8/1 admission 8/2 intubation, start CRRT 8/5 significant improvement in oxygenation; mid morning developed severe sudden shock of uncertain etiology, low grade temp  Consults:  Nephrology  Procedures:  8/2 ETT >  8/2 L IJ CVL >  8/3 R IJ HD cath >   Significant Diagnostic Tests:  8/2 lower ext doppler/vascular ultrasound >> acute R DVT peroneal vein, left negative 8/2 renal ultrasound >> no hydro, findings consistent with medicorenal disease  Micro Data:  8/1 SARS COV 2 > positive 8/1 blood >   8/5 blood >  8/5 resp >   Antimicrobials/COVID Rx:  8/1 ceftriaxone > 8/5 8/1 remdesivir >  8/1 tocilizumab   Interim history/subjective:   Dialysis catheter pulled back overnight due to low flows, now running well. Spent most of the previous 24 hours off CRRT. Continues on CRRT and seems to be doing okay Some ventilator dyssynchrony this morning  Objective   Blood pressure 106/61, pulse 88, temperature 97.9 F (36.6 C), resp. rate (!) 21, height 5\' 9"  (1.753 m), weight 120 kg, SpO2 100 %. CVP:  [5 mmHg-18 mmHg] 11 mmHg  Vent Mode: PRVC FiO2 (%):  [50 %-70 %] 70 % Set Rate:  [25 bmp] 25 bmp Vt Set:  [560 mL] 560 mL PEEP:  [14 cmH20] 14 cmH20   Intake/Output Summary (Last 24 hours) at 07/28/2020 1036 Last data filed at 07/28/2020 0900 Gross per 24 hour  Intake 4526.2 ml  Output 6745 ml  Net -2218.8 ml   Filed Weights   07/24/20 0500 07/27/20 0427 07/28/20 0500  Weight: 112.5 kg 124.2 kg 120 kg    Examination: General: Sedated HENT: Anicteric PULM: Rhonchi bilaterally CV: RRR, no murmurs GI:  obese, soft, NT MSK: LE edema, no clubbing or cyanosis Neuro: RASS -5, PERRL, no withdrawal from pain  8/8 CXR-persistent bilateral opacities, ET tube and dialysis catheter in appropriate position.  -Reviewed by myself  Resolved Hospital Problem list    Assessment & Plan:  ARDS due to COVID 19 pneumonia > worsening oxygenation on 8/6 Acute pulmonary edema due to acute renal failure Likely acute massive PE with acute cor pulmonale-> s/p systemic TPA on 8/6. -Con't LTVV, plateau pressure at goal. -Daily SAT and SBT when appropriate- O2 requirements too high for SBT.  Having increased respiratory efforts today-did improve with sedation -Continuing CRRT -Continue steroids -Broad-spectrum antibiotics discontinued -Anticoagulation continued for massive PE  Severe shock midmorning 8/5 Periodic bradycardia> no clear source, possibly due to hypothermia -Pressors as required -Continue anticoagulation  Acute DVT R leg -Continue heparin infusion  AKI with hyperkalemia BPH Metabolic acidosis-stable -CRRT per nephrology -Strict I's/O -Renally dose meds and avoid nephrotoxic meds  Hyperglycemia-better controlled today -Continue Levemir 25 units twice daily -Sliding scale insulin as needed -Continue monitoring  Need for sedation for mechanical ventilation -RASS target: -1 -Continue versed prn, fentanyl infusion per PAD protocol, propofol   Best practice:  Diet: NPO Pain/Anxiety/Delirium protocol (if indicated): n/a VAP protocol (if indicated): Per protocol DVT prophylaxis: heparin infusion GI prophylaxis: Protonix Glucose control: basal bolus insulin Mobility: bed rest Code Status: full Family Communication: brother Herbie [place] updated  8/8 Disposition: remain in ICU  Labs   CBC: Recent Labs  Lab 07/22/20 0350 07/22/20 0350 07/23/20 0210 07/23/20 0210 07/24/20 0359 07/24/20 1202 07/25/20 6468 07/25/20 0212 07/25/20 1830 07/25/20 1830 07/26/20 0400 07/26/20 2123  07/27/20 1055 07/27/20 1243 07/27/20 1711 07/27/20 2133 07/28/20 0134 07/28/20 0209 07/28/20 0409  WBC 6.6   < > 9.9   < > 12.1*   < > 15.6*  --  19.2*  --  21.3*  --  20.7*  --   --   --   --   --  23.3*  NEUTROABS 5.8  --  8.6*  --  10.4*  --  12.8*  --   --   --  16.9*  --   --   --   --   --   --   --   --   HGB 13.8   < > 12.8*   < > 14.0   < > 12.1*   < > 11.9*   < > 11.9*   < > 10.3*   < > 11.9* 10.9* 11.2* 12.2* 10.4*  HCT 42.8   < > 38.9*   < > 43.0   < > 35.2*   < > 36.3*   < > 36.1*   < > 33.4*   < > 35.0* 32.0* 33.0* 36.0* 35.4*  MCV 87.0   < > 86.6   < > 87.6   < > 82.8  --  85.8  --  86.6  --  91.5  --   --   --   --   --  95.9  PLT 242   < > 218   < > 174   < > 157  --  205  --  196  --  212  --   --   --   --   --  231   < > = values in this interval not displayed.    Basic Metabolic Panel: Recent Labs  Lab 07/24/20 0359 07/24/20 1202 07/25/20 0212 07/25/20 1600 07/26/20 0027 07/26/20 0027 07/26/20 0400 07/26/20 0400 07/26/20 1428 07/26/20 2123 07/27/20 1055 07/27/20 1243 07/27/20 1711 07/27/20 2133 07/28/20 0134 07/28/20 0209 07/28/20 0409  NA 133*  133*   < > 135  135   < > 137   < > 136  135   < > 134*   < > 140  141   < > 148* 143 144 149* 137  K 4.4  4.3   < > 3.5  3.5   < > 4.5   < > 3.9  3.9   < > 4.5   < > 4.7  4.8   < > 4.5 5.9* 5.7* 4.6 5.5*  CL 97*  96*   < > 92*  92*   < > 94*   < > 92*  92*   < > 92*   < > 109  108   < > 119* 117* 119* 124* 113*  CO2 26  25   < > 31  30   < > 27  --  25  26  --  25  --  20*  20*  --   --   --   --   --  13*  GLUCOSE 252*  246*   < > 212*  216*   < > 205*   < > 217*  217*   < > 308*   < > 180*  178*   < >  190* 227* 191* 189* 213*  BUN 47*  47*   < > 61*  60*   < > 85*   < > 95*  97*   < > 109*   < > 78*  78*   < > 56* 78* 67* 47* 67*  CREATININE 2.57*  2.65*   < > 2.85*  2.71*   < > 3.83*   < > 4.42*  4.16*   < > 5.03*   < > 4.16*  4.26*   < > 2.60* 3.60* 3.50* 2.30* 3.10*  CALCIUM  7.4*  7.5*   < > 6.5*  6.5*   < > 7.3*  --  7.3*  7.2*  --  7.3*  --  6.9*  6.8*  --   --   --   --   --  6.5*  MG 2.6*  --  2.3  --   --   --  2.4  --   --   --  2.4  --   --   --   --   --  2.1  PHOS 3.5  3.6   < > 2.5  2.6   < > 6.0*  --  5.4*  --  6.2*  --  5.1*  --   --   --   --   --  5.1*   < > = values in this interval not displayed.   GFR: Estimated Creatinine Clearance: 31.2 mL/min (A) (by C-G formula based on SCr of 3.1 mg/dL (H)). Recent Labs  Lab 07/24/20 0359 07/24/20 1130 07/24/20 1202 07/24/20 1928 07/25/20 0212 07/25/20 1830 07/25/20 2106 07/26/20 0400 07/27/20 1055 07/28/20 0409  WBC   < >  --  33.6*  --    < > 19.2*  --  21.3* 20.7* 23.3*  LATICACIDVEN  --  8.4* >11.0* 3.6*  --   --  2.6*  --   --   --    < > = values in this interval not displayed.    Liver Function Tests: Recent Labs  Lab 07/23/20 0210 07/23/20 1600 07/24/20 0359 07/24/20 0359 07/24/20 1202 07/24/20 1600 07/25/20 0212 07/25/20 1600 07/26/20 0027 07/26/20 0400 07/26/20 1428 07/27/20 1055 07/28/20 0409  AST 55*  --  85*  --  163*  --  184*  --   --  105*  --   --   --   ALT 28  --  38  --  91*  --  116*  --   --  103*  --   --   --   ALKPHOS 64  --  78  --  85  --  71  --   --  75  --   --   --   BILITOT 0.4  --  0.3  --  0.5  --  0.3  --   --  0.6  --   --   --   PROT 5.4*  --  6.0*  --  5.2*  --  4.7*  --   --  4.6*  --   --   --   ALBUMIN 2.0*  2.0*   < > 2.2*  2.3*   < > 1.9*   < > 1.7*  1.7*   < > 2.0* 1.9*  1.9* 1.9* 2.0* 2.1*   < > = values in this interval not displayed.   No results for input(s): LIPASE, AMYLASE in the last 168 hours. No results for input(s):  AMMONIA in the last 168 hours.  ABG    Component Value Date/Time   PHART 7.200 (L) 07/27/2020 1340   PCO2ART 42.5 07/27/2020 1340   PO2ART 44.3 (L) 07/27/2020 1340   HCO3 16.0 (L) 07/27/2020 1340   TCO2 12 (L) 07/28/2020 0209   ACIDBASEDEF 11.1 (H) 07/27/2020 1340   O2SAT 65.3 07/27/2020 1340      Coagulation Profile: Recent Labs  Lab 07/21/20 2301 07/25/20 1830  INR 1.3* 1.2    Cardiac Enzymes: Recent Labs  Lab 07/24/20 1202  CKTOTAL 2,124*  CKMB 14.0*    HbA1C: Hgb A1c MFr Bld  Date/Time Value Ref Range Status  07/23/2020 09:36 AM 12.3 (H) 4.8 - 5.6 % Final    Comment:    (NOTE) Pre diabetes:          5.7%-6.4%  Diabetes:              >6.4%  Glycemic control for   <7.0% adults with diabetes     CBG: Recent Labs  Lab 07/27/20 1625 07/27/20 1955 07/27/20 2312 07/28/20 0416 07/28/20 0818  GLUCAP 189* 181* 208* 183* 184*    The patient is critically ill with multiple organ systems failure and requires high complexity decision making for assessment and support, frequent evaluation and titration of therapies, application of advanced monitoring technologies and extensive interpretation of multiple databases. Critical Care Time devoted to patient care services described in this note independent of APP/resident time (if applicable)  is 32 minutes.   Sherrilyn Rist MD [place] [facility] Personal pager: [phone] If unanswered, please page CCM On-call: [phone]

## 2020-07-28 NOTE — TOC Progression Note (Signed)
Transition of Care [facility]) - Progression Note    Patient Details  Name: Alex [name] MRN: [id number] Date of Birth: [birth year]  Transition of Care [facility]) CM/SW Contact  Leeroy Cha, RN Phone Number: 07/28/2020, 7:35 AM  Clinical Narrative:    Remain vent dependent, shock episode mid day on [id number].  Sedated and on iv pressors.CRRT for renal failure. Following for progression and toc needs. Pt is critical at this time.  Expected Discharge Plan: Home/Self Care Barriers to Discharge: Continued Medical Work up  Expected Discharge Plan and Services Expected Discharge Plan: Home/Self Care       Living arrangements for the past 2 months: Single Family Home                                       Social Determinants of Health (SDOH) Interventions    Readmission Risk Interventions No flowsheet data found.

## 2020-07-29 ENCOUNTER — Inpatient Hospital Stay (HOSPITAL_COMMUNITY): Payer: Medicaid Other

## 2020-07-29 DIAGNOSIS — J9601 Acute respiratory failure with hypoxia: Secondary | ICD-10-CM | POA: Diagnosis not present

## 2020-07-29 DIAGNOSIS — N179 Acute kidney failure, unspecified: Secondary | ICD-10-CM | POA: Diagnosis not present

## 2020-07-29 DIAGNOSIS — U071 COVID-19: Secondary | ICD-10-CM | POA: Diagnosis not present

## 2020-07-29 LAB — CULTURE, BLOOD (ROUTINE X 2)
Culture: NO GROWTH
Culture: NO GROWTH
Special Requests: ADEQUATE

## 2020-07-29 LAB — RENAL FUNCTION PANEL
Albumin: 2.2 g/dL — ABNORMAL LOW (ref 3.5–5.0)
Albumin: 2.1 g/dL — ABNORMAL LOW (ref 3.5–5.0)
Anion gap: 15 (ref 5–15)
Anion gap: 14 (ref 5–15)
BUN: 52 mg/dL — ABNORMAL HIGH (ref 8–23)
BUN: 47 mg/dL — ABNORMAL HIGH (ref 8–23)
CO2: 22 mmol/L (ref 22–32)
CO2: 24 mmol/L (ref 22–32)
Calcium: 8.4 mg/dL — ABNORMAL LOW (ref 8.9–10.3)
Calcium: 7.4 mg/dL — ABNORMAL LOW (ref 8.9–10.3)
Chloride: 101 mmol/L (ref 98–111)
Chloride: 100 mmol/L (ref 98–111)
Creatinine, Ser: 2.45 mg/dL — ABNORMAL HIGH (ref 0.61–1.24)
Creatinine, Ser: 2.21 mg/dL — ABNORMAL HIGH (ref 0.61–1.24)
GFR calc Af Amer: 31 mL/min — ABNORMAL LOW (ref >60)
GFR calc Af Amer: 35 mL/min — ABNORMAL LOW (ref >60)
GFR calc non Af Amer: 27 mL/min — ABNORMAL LOW (ref >60)
GFR calc non Af Amer: 31 mL/min — ABNORMAL LOW (ref >60)
Glucose, Bld: 230 mg/dL — ABNORMAL HIGH (ref 70–99)
Glucose, Bld: 237 mg/dL — ABNORMAL HIGH (ref 70–99)
Phosphorus: 4.1 mg/dL (ref 2.5–4.6)
Phosphorus: 4.2 mg/dL (ref 2.5–4.6)
Potassium: 3.9 mmol/L (ref 3.5–5.1)
Potassium: 4.3 mmol/L (ref 3.5–5.1)
Sodium: 138 mmol/L (ref 135–145)
Sodium: 138 mmol/L (ref 135–145)

## 2020-07-29 LAB — POCT I-STAT, CHEM 8
BUN: 35 mg/dL — ABNORMAL HIGH (ref 8–23)
BUN: 36 mg/dL — ABNORMAL HIGH (ref 8–23)
BUN: 45 mg/dL — ABNORMAL HIGH (ref 8–23)
Calcium, Ion: 0.46 mmol/L — CL (ref 1.15–1.40)
Calcium, Ion: 1.04 mmol/L — ABNORMAL LOW (ref 1.15–1.40)
Calcium, Ion: <0.3 mmol/L — CL (ref 1.15–1.40)
Chloride: 98 mmol/L (ref 98–111)
Chloride: 102 mmol/L (ref 98–111)
Chloride: 99 mmol/L (ref 98–111)
Creatinine, Ser: 1.4 mg/dL — ABNORMAL HIGH (ref 0.61–1.24)
Creatinine, Ser: 1.4 mg/dL — ABNORMAL HIGH (ref 0.61–1.24)
Creatinine, Ser: 2.2 mg/dL — ABNORMAL HIGH (ref 0.61–1.24)
Glucose, Bld: 234 mg/dL — ABNORMAL HIGH (ref 70–99)
Glucose, Bld: 229 mg/dL — ABNORMAL HIGH (ref 70–99)
Glucose, Bld: 234 mg/dL — ABNORMAL HIGH (ref 70–99)
HCT: 38 % — ABNORMAL LOW (ref 39.0–52.0)
HCT: 34 % — ABNORMAL LOW (ref 39.0–52.0)
HCT: 39 % (ref 39.0–52.0)
Hemoglobin: 12.9 g/dL — ABNORMAL LOW (ref 13.0–17.0)
Hemoglobin: 11.6 g/dL — ABNORMAL LOW (ref 13.0–17.0)
Hemoglobin: 13.3 g/dL (ref 13.0–17.0)
Potassium: 3 mmol/L — ABNORMAL LOW (ref 3.5–5.1)
Potassium: 3.8 mmol/L (ref 3.5–5.1)
Potassium: 3.9 mmol/L (ref 3.5–5.1)
Sodium: 138 mmol/L (ref 135–145)
Sodium: 136 mmol/L (ref 135–145)
Sodium: 139 mmol/L (ref 135–145)
TCO2: 24 mmol/L (ref 22–32)
TCO2: 24 mmol/L (ref 22–32)
TCO2: 25 mmol/L (ref 22–32)

## 2020-07-29 LAB — BLOOD GAS, ARTERIAL
Acid-base deficit: 2.7 mmol/L — ABNORMAL HIGH (ref 0.0–2.0)
Acid-base deficit: 4.4 mmol/L — ABNORMAL HIGH (ref 0.0–2.0)
Acid-base deficit: 4.5 mmol/L — ABNORMAL HIGH (ref 0.0–2.0)
Bicarbonate: 24.5 mmol/L (ref 20.0–28.0)
Bicarbonate: 22.7 mmol/L (ref 20.0–28.0)
Bicarbonate: 22.8 mmol/L (ref 20.0–28.0)
Drawn by: 29503
FIO2: 50
FIO2: 50
MECHVT: 560 mL
O2 Saturation: 61.8 %
O2 Saturation: 58.9 %
O2 Saturation: 85.6 %
PEEP: 12 cmH2O
Patient temperature: 98.6
Patient temperature: 98.6
Patient temperature: 98.6
RATE: 22 resp/min
pCO2 arterial: 57.1 mmHg — ABNORMAL HIGH (ref 32.0–48.0)
pCO2 arterial: 52.9 mmHg — ABNORMAL HIGH (ref 32.0–48.0)
pCO2 arterial: 55.4 mmHg — ABNORMAL HIGH (ref 32.0–48.0)
pH, Arterial: 7.255 — ABNORMAL LOW (ref 7.350–7.450)
pH, Arterial: 7.256 — ABNORMAL LOW (ref 7.350–7.450)
pH, Arterial: 7.238 — ABNORMAL LOW (ref 7.350–7.450)
pO2, Arterial: 38 mmHg — CL (ref 83.0–108.0)
pO2, Arterial: 36.3 mmHg — CL (ref 83.0–108.0)
pO2, Arterial: 59.4 mmHg — ABNORMAL LOW (ref 83.0–108.0)

## 2020-07-29 LAB — CBC
HCT: 34.4 % — ABNORMAL LOW (ref 39.0–52.0)
Hemoglobin: 10.7 g/dL — ABNORMAL LOW (ref 13.0–17.0)
MCH: 28.4 pg (ref 26.0–34.0)
MCHC: 31.1 g/dL (ref 30.0–36.0)
MCV: 91.2 fL (ref 80.0–100.0)
Platelets: 223 10*3/uL (ref 150–400)
RBC: 3.77 MIL/uL — ABNORMAL LOW (ref 4.22–5.81)
RDW: 15.6 % — ABNORMAL HIGH (ref 11.5–15.5)
WBC: 25.8 10*3/uL — ABNORMAL HIGH (ref 4.0–10.5)
nRBC: 2.8 % — ABNORMAL HIGH (ref 0.0–0.2)

## 2020-07-29 LAB — CALCIUM, IONIZED
Calcium, Ionized, Serum: 5.5 mg/dL (ref 4.5–5.6)
Calcium, Ionized, Serum: 3.3 mg/dL — ABNORMAL LOW (ref 4.5–5.6)

## 2020-07-29 LAB — GLUCOSE, CAPILLARY
Glucose-Capillary: 180 mg/dL — ABNORMAL HIGH (ref 70–99)
Glucose-Capillary: 197 mg/dL — ABNORMAL HIGH (ref 70–99)
Glucose-Capillary: 199 mg/dL — ABNORMAL HIGH (ref 70–99)
Glucose-Capillary: 199 mg/dL — ABNORMAL HIGH (ref 70–99)
Glucose-Capillary: 223 mg/dL — ABNORMAL HIGH (ref 70–99)
Glucose-Capillary: 232 mg/dL — ABNORMAL HIGH (ref 70–99)
Glucose-Capillary: 236 mg/dL — ABNORMAL HIGH (ref 70–99)

## 2020-07-29 LAB — HEPARIN LEVEL (UNFRACTIONATED): Heparin Unfractionated: 0.68 IU/mL (ref 0.30–0.70)

## 2020-07-29 LAB — MAGNESIUM: Magnesium: 2.1 mg/dL (ref 1.7–2.4)

## 2020-07-29 LAB — APTT: aPTT: 67 seconds — ABNORMAL HIGH (ref 24–36)

## 2020-07-29 MED ORDER — VECURONIUM BROMIDE 10 MG IV SOLR: 8.0000 mg | Freq: Once | INTRAVENOUS | Status: AC

## 2020-07-29 MED ORDER — NOREPINEPHRINE 16 MG/250ML-% IV SOLN
0.0000 ug/min | INTRAVENOUS | Status: DC
  Administered 2020-07-29: 2 ug/min via INTRAVENOUS
  Administered 2020-08-01: 9 ug/min via INTRAVENOUS
  Administered 2020-08-02: 16 ug/min via INTRAVENOUS
  Administered 2020-08-03: 7 ug/min via INTRAVENOUS
  Administered 2020-08-06: 5 ug/min via INTRAVENOUS
  Filled 2020-07-29 (×7): qty 250

## 2020-07-29 MED ORDER — ADULT MULTIVITAMIN W/MINERALS CH
1.0000 | ORAL_TABLET | Freq: Every day | ORAL | Status: DC
  Administered 2020-07-30 – 2020-08-08 (×10): 1 via ORAL
  Filled 2020-07-29 (×11): qty 1

## 2020-07-29 MED ORDER — PIVOT 1.5 CAL PO LIQD
1000.0000 mL | ORAL | Status: DC
  Administered 2020-07-29: 1000 mL
  Filled 2020-07-29 (×2): qty 1000

## 2020-07-29 MED ORDER — INSULIN DETEMIR 100 UNIT/ML ~~LOC~~ SOLN
30.0000 [IU] | Freq: Two times a day (BID) | SUBCUTANEOUS | Status: DC
  Administered 2020-07-29 – 2020-08-05 (×15): 30 [IU] via SUBCUTANEOUS
  Filled 2020-07-29 (×16): qty 0.3

## 2020-07-29 MED ORDER — VECURONIUM BROMIDE 10 MG IV SOLR
10.0000 mg | INTRAVENOUS | Status: DC | PRN
  Administered 2020-07-29 – 2020-07-30 (×4): 10 mg via INTRAVENOUS
  Filled 2020-07-29 (×3): qty 10

## 2020-07-29 MED ORDER — PROSOURCE TF PO LIQD
90.0000 mL | Freq: Every day | ORAL | Status: DC
  Administered 2020-07-29 – 2020-08-10 (×72): 90 mL
  Filled 2020-07-29 (×59): qty 90

## 2020-07-29 MED ORDER — VECURONIUM BROMIDE 10 MG IV SOLR
INTRAVENOUS | Status: AC
  Administered 2020-07-29: 8 mg via INTRAVENOUS
  Filled 2020-07-29: qty 10

## 2020-07-29 MED ORDER — PRISMASOL B22GK 4/0 22-4 MEQ/L IV SOLN
INTRAVENOUS | Status: DC
  Filled 2020-07-29 (×4): qty 5000

## 2020-07-29 MED ORDER — PREDNISONE 20 MG PO TABS
50.0000 mg | ORAL_TABLET | Freq: Every day | ORAL | Status: DC
  Administered 2020-07-30 – 2020-08-09 (×11): 50 mg
  Filled 2020-07-29 (×11): qty 2

## 2020-07-29 NOTE — Progress Notes (Signed)
Lab notified of incoming ABG

## 2020-07-29 NOTE — Progress Notes (Addendum)
NAME:  Alex [name], MRN:  [id number], DOB:  [birth year], LOS: 9 ADMISSION DATE:  08/16/2020, CONSULTATION DATE:  8/1 REFERRING MD:  Sedonia Small, CHIEF COMPLAINT:  Dyspnea   Brief History   64 y/o male with acute hypoxemic respiratory failure due to COVID 19 pneumonia admitted on 8/1 Day 9 on vent Day 9 of CRRT  Past Medical History  Cigarette smoker DM2 Hypertension CKD?  Significant Hospital Events   8/1 admission 8/2 intubation, start CRRT 8/5 significant improvement in oxygenation; mid morning developed severe sudden shock of uncertain etiology, low grade temp 8/10 no overnight events  Consults:  Nephrology  Procedures:  8/2 ETT >  8/2 L IJ CVL >  8/3 R IJ HD cath >   Significant Diagnostic Tests:  8/2 lower ext doppler/vascular ultrasound >> acute R DVT peroneal vein, left negative 8/2 renal ultrasound >> no hydro, findings consistent with medicorenal disease  Micro Data:  8/1 SARS COV 2 > positive 8/1 blood >   8/5 blood >  8/5 resp >   Antimicrobials/COVID Rx:  8/1 ceftriaxone > 8/5 8/1 remdesivir >  8/1 tocilizumab   Interim history/subjective:   Dialysis catheter pulled back overnight due to low flows, now running well. Spent most of the previous 24 hours off CRRT. Continues on CRRT and seems to be doing okay Continues with some ventilator dyssynchrony Saturations better  Objective   Blood pressure (!) 115/58, pulse 80, temperature 98.2 F (36.8 C), resp. rate 14, height 5\' 9"  (1.753 m), weight 120 kg, SpO2 100 %. CVP:  [7 mmHg-12 mmHg] 12 mmHg  Vent Mode: PRVC FiO2 (%):  [50 %-60 %] 50 % Set Rate:  [20 bmp-25 bmp] 20 bmp Vt Set:  [560 mL] 560 mL PEEP:  [12 cmH20-14 cmH20] 12 cmH20 Plateau Pressure:  [22 cmH20-28 cmH20] 28 cmH20   Intake/Output Summary (Last 24 hours) at 07/29/2020 0819 Last data filed at 07/29/2020 0800 Gross per 24 hour  Intake 4849.72 ml  Output 7703 ml  Net -2853.28 ml   Filed Weights   07/27/20 0427 07/28/20 0500 07/29/20  0445  Weight: 124.2 kg 120 kg 120 kg    Examination: General: Sedated HENT: Anicteric PULM: Rhonchi bilaterally CV: RRR, S1-S2 appreciated GI: obese, soft, NT MSK: LE edema, no clubbing or cyanosis Neuro: RASS -5, PERRL, no withdrawal from pain   8/8 CXR-persistent bilateral opacities, ET tube and dialysis catheter in appropriate position.  -Reviewed by myself  Resolved Hospital Problem list    Assessment & Plan:  ARDS due to COVID 19 pneumonia > worsening oxygenation on 8/6 Acute pulmonary edema due to acute renal failure Likely acute massive PE with acute cor pulmonale-> s/p systemic TPA on 8/6. -Con't LTVV, plateau pressure at goal. -Daily SAT and SBT when appropriate- O2 requirements too high for SBT.  Oxygen requirement is improving  -Continuing CRRT -Continue steroids -Broad-spectrum antibiotics discontinued -Anticoagulation continued for massive PE  Severe shock midmorning 8/5 Periodic bradycardia> no clear source, possibly due to hypothermia -Pressors as required -Norepinephrine being decreased -Continue anticoagulation  Acute DVT R leg -Continue heparin infusion  AKI with hyperkalemia BPH Metabolic acidosis-stable -CRRT per nephrology -Strict I's/O -Renally dose meds and avoid nephrotoxic meds  Hyperglycemia-better controlled today -Levemir increased to 30 units twice daily -Sliding scale insulin as needed -Continue monitoring  Need for sedation for mechanical ventilation -RASS target: -1 -Continue versed prn, fentanyl infusion per PAD protocol, propofol  ABG, chest x-ray pending for this morning Best practice:  Diet: tube feeds Pain/Anxiety/Delirium  protocol (if indicated): On fentanyl, propofol VAP protocol (if indicated): Per protocol DVT prophylaxis: heparin infusion GI prophylaxis: Protonix Glucose control: basal bolus insulin Mobility: bed rest Code Status: full Family Communication: brother Herbie [place] updated 8/8 Disposition: remain in  ICU  Labs   CBC: Recent Labs  Lab 07/23/20 0210 07/23/20 0210 07/24/20 0359 07/24/20 1202 07/25/20 0212 07/25/20 8315 07/25/20 1830 07/25/20 1830 07/26/20 0400 07/26/20 2123 07/27/20 1055 07/27/20 1243 07/28/20 0409 07/28/20 0409 07/28/20 1008 07/28/20 1016 07/28/20 1903 07/29/20 0308 07/29/20 0355  WBC 9.9   < > 12.1*   < > 15.6*   < > 19.2*  --  21.3*  --  20.7*  --  23.3*  --   --   --   --   --  25.8*  NEUTROABS 8.6*  --  10.4*  --  12.8*  --   --   --  16.9*  --   --   --   --   --   --   --   --   --   --   HGB 12.8*   < > 14.0   < > 12.1*   < > 11.9*   < > 11.9*   < > 10.3*   < > 10.4*   < > 13.3 11.6* 12.9* 12.9* 10.7*  HCT 38.9*   < > 43.0   < > 35.2*   < > 36.3*   < > 36.1*   < > 33.4*   < > 35.4*   < > 39.0 34.0* 38.0* 38.0* 34.4*  MCV 86.6   < > 87.6   < > 82.8   < > 85.8  --  86.6  --  91.5  --  95.9  --   --   --   --   --  91.2  PLT 218   < > 174   < > 157   < > 205  --  196  --  212  --  231  --   --   --   --   --  223   < > = values in this interval not displayed.    Basic Metabolic Panel: Recent Labs  Lab 07/25/20 0212 07/25/20 1600 07/26/20 0400 07/26/20 0400 07/26/20 1428 07/26/20 2123 07/27/20 1055 07/27/20 1243 07/28/20 0409 07/28/20 1008 07/28/20 1016 07/28/20 1546 07/28/20 1903 07/29/20 0308 07/29/20 0355  NA 135  135   < > 136  135   < > 134*   < > 140  141   < > 137   < > 145 142 141 138 138  K 3.5  3.5   < > 3.9  3.9   < > 4.5   < > 4.7  4.8   < > 5.5*   < > 5.0 5.0 3.7 3.0* 3.9  CL 92*  92*   < > 92*  92*   < > 92*   < > 109  108   < > 113*   < > 119* 112* 101 98 101  CO2 31  30   < > 25  26   < > 25  --  20*  20*  --  13*  --   --  21*  --   --  22  GLUCOSE 212*  216*   < > 217*  217*   < > 308*   < > 180*  178*   < > 213*   < >  187* 259* 292* 234* 230*  BUN 61*  60*   < > 95*  97*   < > 109*   < > 78*  78*   < > 67*   < > 54* 59* 41* 35* 52*  CREATININE 2.85*  2.71*   < > 4.42*  4.16*   < > 5.03*   < > 4.16*   4.26*   < > 3.10*   < > 3.00* 2.78* 1.60* 1.40* 2.45*  CALCIUM 6.5*  6.5*   < > 7.3*  7.2*   < > 7.3*  --  6.9*  6.8*  --  6.5*  --   --  7.9*  --   --  8.4*  MG 2.3  --  2.4  --   --   --  2.4  --  2.1  --   --   --   --   --  2.1  PHOS 2.5  2.6   < > 5.4*   < > 6.2*  --  5.1*  --  5.1*  --   --  4.7*  --   --  4.1   < > = values in this interval not displayed.   GFR: Estimated Creatinine Clearance: 39.5 mL/min (A) (by C-G formula based on SCr of 2.45 mg/dL (H)). Recent Labs  Lab 07/24/20 0359 07/24/20 1130 07/24/20 1202 07/24/20 1928 07/25/20 0212 07/25/20 1830 07/25/20 2106 07/26/20 0400 07/27/20 1055 07/28/20 0409 07/29/20 0355  WBC   < >  --  33.6*  --    < >   < >  --  21.3* 20.7* 23.3* 25.8*  LATICACIDVEN  --  8.4* >11.0* 3.6*  --   --  2.6*  --   --   --   --    < > = values in this interval not displayed.    Liver Function Tests: Recent Labs  Lab 07/23/20 0210 07/23/20 1600 07/24/20 0359 07/24/20 0359 07/24/20 1202 07/24/20 1600 07/25/20 0212 07/25/20 1600 07/26/20 0400 07/26/20 0400 07/26/20 1428 07/27/20 1055 07/28/20 0409 07/28/20 1546 07/29/20 0355  AST 55*  --  85*  --  163*  --  184*  --  105*  --   --   --   --   --   --   ALT 28  --  38  --  91*  --  116*  --  103*  --   --   --   --   --   --   ALKPHOS 64  --  78  --  85  --  71  --  75  --   --   --   --   --   --   BILITOT 0.4  --  0.3  --  0.5  --  0.3  --  0.6  --   --   --   --   --   --   PROT 5.4*  --  6.0*  --  5.2*  --  4.7*  --  4.6*  --   --   --   --   --   --   ALBUMIN 2.0*  2.0*   < > 2.2*  2.3*   < > 1.9*   < > 1.7*  1.7*   < > 1.9*  1.9*   < > 1.9* 2.0* 2.1* 2.1* 2.2*   < > = values in this interval not displayed.   No results for  input(s): LIPASE, AMYLASE in the last 168 hours. No results for input(s): AMMONIA in the last 168 hours.  ABG    Component Value Date/Time   PHART 7.200 (L) 07/27/2020 1340   PCO2ART 42.5 07/27/2020 1340   PO2ART 44.3 (L) 07/27/2020 1340    HCO3 16.0 (L) 07/27/2020 1340   TCO2 24 07/29/2020 0308   ACIDBASEDEF 11.1 (H) 07/27/2020 1340   O2SAT 65.3 07/27/2020 1340     Coagulation Profile: Recent Labs  Lab 07/25/20 1830  INR 1.2    Cardiac Enzymes: Recent Labs  Lab 07/24/20 1202  CKTOTAL 2,124*  CKMB 14.0*    HbA1C: Hgb A1c MFr Bld  Date/Time Value Ref Range Status  07/23/2020 09:36 AM 12.3 (H) 4.8 - 5.6 % Final    Comment:    (NOTE) Pre diabetes:          5.7%-6.4%  Diabetes:              >6.4%  Glycemic control for   <7.0% adults with diabetes     CBG: Recent Labs  Lab 07/28/20 1746 07/28/20 2013 07/29/20 0006 07/29/20 0349 07/29/20 0746  GLUCAP 237* 266* 236* 199* 199*    The patient is critically ill with multiple organ systems failure and requires high complexity decision making for assessment and support, frequent evaluation and titration of therapies, application of advanced monitoring technologies and extensive interpretation of multiple databases. Critical Care Time devoted to patient care services described in this note independent of APP/resident time (if applicable)  is 35 minutes.   Sherrilyn Rist MD [place] [facility] Personal pager: [phone] If unanswered, please page CCM On-call: [phone]

## 2020-07-29 NOTE — Procedures (Signed)
   I was present at this CRRT session, have reviewed the session itself and made  appropriate changes Kelly Splinter MD [place] pager [phone]   07/29/2020, 7:26 AM

## 2020-07-29 NOTE — Progress Notes (Signed)
Nutrition Follow-up  DOCUMENTATION CODES:   Obesity unspecified  INTERVENTION:  - will adjust TF regimen: Pivot 1.5 @ 40 ml/hr with 90 ml Prosource TF x6/day (12 packets/day) and 1 tablet multivitamin with minerals/day.  - this regimen + kcal from current Propofol rate will provide 2812 kcal, 222 grams protein, and 729 ml free water. - free water flush, if desired, to be per CCM.  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 -ongoing  GOAL:   Provide needs based on ASPEN/SCCM guidelines -met with TF regimen  MONITOR:   Vent status, TF tolerance, Labs, Weight trends, I & O's  ASSESSMENT:   64 year old male who quit smoking 2 weeks PTA. He presented to the ED due to 3 day hx of fever, feeling unwell, generalized weakness, and SOB. He family brought him in due to poor responsiveness with O2 of 45% in the ED. CXR showed evidence of bilateral interstitial and alveolar infiltrates. Patient is unvaccinated against COVID-19 and was diagnosed with COVID-19 this admission.  Significant Events: 8/2- admission; intubation; OGT placement; TF initiation 8/3- CRRT started early morning; initial RD assessment (NFPE completed) 8/5- severe shock 8/6- systemic TPA d/t suspected acute massive PE with acute cor pulmonale    Patient remains intubated with OGT in place (gastric). He is receiving TF at goal rate: Pivot 1.5 @ 45 ml/hr with 90 ml Prosource TF x5/day. This regimen is providing 1620 kcal, 2020 kcal, 211 grams protein, and 820 ml free water. No free water flush is ordered.   Weight continues to be up. Flow sheet documentation indicates moderate pitting edema to BUE and deep pitting edema to BLE. Patient continues to CRRT. Not proning/no plan for proning.      Patient is currently intubated on ventilator support MV: 12 L/min Temp (24hrs), Avg:98.4 F (36.9 C), Min:97.7 F (36.5 C),  Max:98.8 F (37.1 C) Propofol: 33.8 ml/hr (892 kcal)  Labs reviewed; CBGs: 236, 199 x2 mg/dl, BUN: 52 mg/dl, creatinine: 2.45 mg/dl, Ca: 8.4 mg/dl, GFR: 31 ml/min. K, Mg, and Phos all WDL.  Medications reviewed; 500 mg ascorbic acid/day, 100 mg colace BID, sliding scale novolog, 10 units novolog every 4 hours, 30 units levemir BID, 40 mg IV protonix/day, 17 g miralax/day, 50 mg deltasone/day, 220 mg zinc sulfate/day.  Drips; fentanyl @ 150 mcg/hr, heparin @ 1500 units/hr, levo @ 2 mcg/min, propofol @ 50 mcg/kg/min.   Diet Order:   Diet Order            Diet NPO time specified  Diet effective now                 EDUCATION NEEDS:   No education needs have been identified at this time  Skin:  Skin Assessment: Reviewed RN Assessment  Last BM:  8/2  Height:   Ht Readings from Last 1 Encounters:  07/24/20 _0  (1.753 m)    Weight:   Wt Readings from Last 1 Encounters:  07/29/20 120 kg     Estimated Nutritional Needs:  Kcal:  2550-2763 kcal (30-32.5 kcal/kg adjusted wt) Protein:  >/= 202 grams (1.8 grams/kg actual weight) Fluid:  >/= 2.2 L/day     Jarome Matin, MS, RD, LDN, CNSC Inpatient Clinical Dietitian RD pager # available in [facility]  After hours/weekend pager # available in [facility]

## 2020-07-29 NOTE — Progress Notes (Signed)
Notified Lab ABG being sent for analysis. 

## 2020-07-29 NOTE — Progress Notes (Signed)
[facility] Progress Note  Subjective: per RN pt stable, levo gtt a 1-2 ug/m and pulling 100- 150 cc/nhr net neg.  I/O yest were -2.7 L net.  Labs okay, K 3.9.   Vitals:   07/29/20 0600 07/29/20 0615 07/29/20 0630 07/29/20 0645  BP: (!) 115/58     Pulse: 84 81 81 80  Resp: (!) 22 (!) 22 13 14   Temp: 98.1 F (36.7 C) 98.2 F (36.8 C) 98.2 F (36.8 C) 98.2 F (36.8 C)  TempSrc: Bladder     SpO2: 100% 100% 100% 100%  Weight:      Height:        Exam:  Patient not examined today directly given COVID-19 + status, utilizing data taken from chart +/- discussions w/ providers and staff.     CXR 8/8 > IMPRESSION: Appliances appear in satisfactory location. Cardiac enlargement with bilateral perihilar and basilar airspace infiltrates, likely edema.   Renal US 8/2 - 13-14 cm kidneys w/o hydro, +^'d echo seen w/ CKD   UA 8/2 - >300 prot, 20-50 rbc, 6-10 wbc, 0-5 epi   UP/C ratio = e.e   UNa 32, UCr 147  Assessment/ Plan: 1. Renal failure - no old data, hx of "kidney disease" per patient at time of admission. Creat 8.4 on admit 8/2, +proteinuria 3 gm and microhematuria on admit. Renal US normal size kidneys w/ ^'d echo seen w/ chronic disease.  CRRT started 8/3. HD cath malfunction resolved w/ repositioning 8/7, working well now. Cont citrate protocol due to repeated clotting. Volume up and CVP up, cont UF 75- 150 ml/ hr as tolerated, remains on low dose levo gtt.  2. Hyperkalemia - K+ down, will change back to 4K dialysate 3. Metabolic acidosis - better, CO2 22, will change to low bicarb citrate dialysate to prevent met alkalosis as can be seen w/ citrate protocol 4. COVID PNA - w/ vent dependence, sp IV steroids/ remdesivir, on pred 50/d po now. FiO2 0.60 today.  5. Hypotension/ shock - stable, very low dose levo 6. Vol - up 10kg, CVP varies widely 2- 14, + edema last exam 7. Anemia - Hb 10.7, mild decline 8. Suspected acute PE w/ cor pulmonale - sp systemic TPA on 8/6 and  on getting full dose IV systemic heparin 9. Acute RLE DVT - as above 10. DM/ hyperglycemia - on SQ insulin per CCM     Rob [place] 07/29/2020, 7:09 AM   Recent Labs  Lab 07/28/20 1546 07/28/20 1903 07/29/20 0308 07/29/20 0355  K 5.0   < > 3.0* 3.9  BUN 59*   < > 35* 52*  CREATININE 2.78*   < > 1.40* 2.45*  CALCIUM 7.9*  --   --  8.4*  PHOS 4.7*  --   --  4.1  HGB  --    < > 12.9* 10.7*   < > = values in this interval not displayed.   Inpatient medications:  vitamin C  500 mg Per Tube Daily   chlorhexidine gluconate (MEDLINE KIT)  15 mL Mouth Rinse BID   Chlorhexidine Gluconate Cloth  6 each Topical Daily   docusate  100 mg Per Tube BID   feeding supplement (PROSource TF)  90 mL Per Tube 5 X Daily   insulin aspart  0-20 Units Subcutaneous Q4H   insulin aspart  10 Units Subcutaneous Q4H   insulin detemir  25 Units Subcutaneous BID   mouth rinse  15 mL Mouth Rinse 10 times per day  pantoprazole (PROTONIX) IV  40 mg Intravenous Daily   polyethylene glycol  17 g Per Tube Daily   predniSONE  50 mg Oral Q breakfast   sodium chloride flush  3 mL Intravenous Q12H   zinc sulfate  220 mg Per Tube Daily     prismasol BGK 4/2.5 400 mL/hr at 07/28/20 1757   sodium chloride 10 mL/hr at 07/29/20 0700   calcium gluconate infusion for CRRT 20 g (07/28/20 1541)   citrate dextrose 3,000 mL (07/28/20 1951)   DOPamine Stopped (07/25/20 2244)   feeding supplement (PIVOT 1.5 CAL) 1,000 mL (07/28/20 1000)   fentaNYL infusion INTRAVENOUS 175 mcg/hr (07/29/20 0700)   heparin 1,500 Units/hr (07/29/20 0700)   norepinephrine (LEVOPHED) Adult infusion 3 mcg/min (07/29/20 0700)   prismasol BGK 2/2.5 dialysis solution 2,500 mL/hr at 07/29/20 0556   propofol (DIPRIVAN) infusion 50 mcg/kg/min (07/29/20 0700)   vasopressin Stopped (07/24/20 2304)   sodium chloride, acetaminophen, alteplase, EPINEPHrine, fentaNYL, heparin, heparin, midazolam, ondansetron **OR** ondansetron  (ZOFRAN) IV, sennosides, sodium chloride

## 2020-07-29 NOTE — Progress Notes (Signed)
Lab notified of incoming ABG.

## 2020-07-29 NOTE — Progress Notes (Signed)
RN requesting quad strength Norepinephrine due to concern for volume, as patient on CRRT. Notified RN to adjust Alaris pump settings to reflect new concentration. RN verbalized understanding.   Lindell Spar, PharmD, BCPS Clinical Pharmacist  07/29/2020 7:11 PM

## 2020-07-29 NOTE — Progress Notes (Signed)
ANTICOAGULATION CONSULT NOTE - Follow Up Consult  Pharmacy Consult for Heparin Indication: acute DVT  No Known Allergies  Patient Measurements: Height: 5\' 9"  (175.3 cm) Weight: 120 kg (264 lb 8.8 oz) IBW/kg (Calculated) : 70.7 Heparin Dosing Weight: 95 kg  Vital Signs: Temp: 98.2 F (36.8 C) (08/10 0645) Temp Source: Bladder (08/10 0600) BP: 115/58 (08/10 0600) Pulse Rate: 80 (08/10 0645)  Labs: Recent Labs    07/27/20 0400 07/27/20 0632 07/27/20 1055 07/27/20 1136 07/28/20 0409 07/28/20 1008 07/28/20 1016 07/28/20 1546 07/28/20 1903 07/28/20 1903 07/29/20 0308 07/29/20 0355  HGB  --    < > 10.3*   < > 10.4*   < >   < >  --  12.9*   < > 12.9* 10.7*  HCT  --    < > 33.4*   < > 35.4*   < >   < >  --  38.0*  --  38.0* 34.4*  PLT  --   --  212  --  231  --   --   --   --   --   --  223  APTT 60*  --   --   --  66*  --   --   --   --   --   --  67*  HEPARINUNFRC  --   --   --    < > 0.73*  --   --  0.68  --   --   --  0.68  CREATININE  --    < > 4.16*  4.26*   < > 3.10*   < >   < > 2.78* 1.60*  --  1.40* 2.45*   < > = values in this interval not displayed.    Estimated Creatinine Clearance: 39.5 mL/min (A) (by C-G formula based on SCr of 2.45 mg/dL (H)).  Assessment: 64 yo male with COVID PNA, now on CRRT, D-dimer rose from 7 to 17 to start IV heparin per Rx dosing for acute R DVT peroneal vein.   Significant Events: -8/6: Pt had bradycardia, hypotension. Pt given alteplase 50 mg IV infusion over 2 hrs for PE @ 16:08 -8/6: Heparin infusion resumed @ 21:41 s/p TPA with aPTT <80  Today, 07/29/20  Heparin level is therapeutic (0.68) heparin 1500 units/hr  CBC: Hgb low and decreasing, Plt WNL and stable  No signs/symptoms of bleeding documented.   Goal of Therapy:  HL 0.3 - 0.7 units/ml  Monitor platelets by anticoagulation protocol: Yes   Plan:   Continue IV heparin at 1500 units/hr  Daily heparin level and CBC while on heparin  Monitor for  signs/symptoms of bleeding  Peggyann Juba, PharmD, BCPS Pharmacy: [phone] 07/29/2020 8:00 AM

## 2020-07-30 ENCOUNTER — Inpatient Hospital Stay (HOSPITAL_COMMUNITY): Payer: Medicaid Other

## 2020-07-30 DIAGNOSIS — U071 COVID-19: Secondary | ICD-10-CM | POA: Diagnosis not present

## 2020-07-30 DIAGNOSIS — I2699 Other pulmonary embolism without acute cor pulmonale: Secondary | ICD-10-CM

## 2020-07-30 DIAGNOSIS — J9601 Acute respiratory failure with hypoxia: Secondary | ICD-10-CM | POA: Diagnosis not present

## 2020-07-30 DIAGNOSIS — N179 Acute kidney failure, unspecified: Secondary | ICD-10-CM | POA: Diagnosis not present

## 2020-07-30 LAB — CBC
HCT: 34 % — ABNORMAL LOW (ref 39.0–52.0)
Hemoglobin: 10.5 g/dL — ABNORMAL LOW (ref 13.0–17.0)
MCH: 28.3 pg (ref 26.0–34.0)
MCHC: 30.9 g/dL (ref 30.0–36.0)
MCV: 91.6 fL (ref 80.0–100.0)
Platelets: 238 10*3/uL (ref 150–400)
RBC: 3.71 MIL/uL — ABNORMAL LOW (ref 4.22–5.81)
RDW: 15.9 % — ABNORMAL HIGH (ref 11.5–15.5)
WBC: 25.8 10*3/uL — ABNORMAL HIGH (ref 4.0–10.5)
nRBC: 3.1 % — ABNORMAL HIGH (ref 0.0–0.2)

## 2020-07-30 LAB — RENAL FUNCTION PANEL
Albumin: 2.3 g/dL — ABNORMAL LOW (ref 3.5–5.0)
Albumin: 2.4 g/dL — ABNORMAL LOW (ref 3.5–5.0)
Anion gap: 12 (ref 5–15)
Anion gap: 14 (ref 5–15)
BUN: 53 mg/dL — ABNORMAL HIGH (ref 8–23)
BUN: 49 mg/dL — ABNORMAL HIGH (ref 8–23)
CO2: 24 mmol/L (ref 22–32)
CO2: 23 mmol/L (ref 22–32)
Calcium: 7.3 mg/dL — ABNORMAL LOW (ref 8.9–10.3)
Calcium: 7.2 mg/dL — ABNORMAL LOW (ref 8.9–10.3)
Chloride: 99 mmol/L (ref 98–111)
Chloride: 97 mmol/L — ABNORMAL LOW (ref 98–111)
Creatinine, Ser: 2.18 mg/dL — ABNORMAL HIGH (ref 0.61–1.24)
Creatinine, Ser: 1.94 mg/dL — ABNORMAL HIGH (ref 0.61–1.24)
GFR calc Af Amer: 36 mL/min — ABNORMAL LOW (ref >60)
GFR calc Af Amer: 41 mL/min — ABNORMAL LOW (ref >60)
GFR calc non Af Amer: 31 mL/min — ABNORMAL LOW (ref >60)
GFR calc non Af Amer: 36 mL/min — ABNORMAL LOW (ref >60)
Glucose, Bld: 209 mg/dL — ABNORMAL HIGH (ref 70–99)
Glucose, Bld: 237 mg/dL — ABNORMAL HIGH (ref 70–99)
Phosphorus: 4.4 mg/dL (ref 2.5–4.6)
Phosphorus: 5.6 mg/dL — ABNORMAL HIGH (ref 2.5–4.6)
Potassium: 3.9 mmol/L (ref 3.5–5.1)
Potassium: 4.4 mmol/L (ref 3.5–5.1)
Sodium: 135 mmol/L (ref 135–145)
Sodium: 134 mmol/L — ABNORMAL LOW (ref 135–145)

## 2020-07-30 LAB — MAGNESIUM: Magnesium: 2.2 mg/dL (ref 1.7–2.4)

## 2020-07-30 LAB — POCT I-STAT, CHEM 8
BUN: 37 mg/dL — ABNORMAL HIGH (ref 8–23)
BUN: 51 mg/dL — ABNORMAL HIGH (ref 8–23)
BUN: 37 mg/dL — ABNORMAL HIGH (ref 8–23)
BUN: 47 mg/dL — ABNORMAL HIGH (ref 8–23)
BUN: 37 mg/dL — ABNORMAL HIGH (ref 8–23)
BUN: 45 mg/dL — ABNORMAL HIGH (ref 8–23)
BUN: 37 mg/dL — ABNORMAL HIGH (ref 8–23)
BUN: 49 mg/dL — ABNORMAL HIGH (ref 8–23)
Calcium, Ion: 0.96 mmol/L — ABNORMAL LOW (ref 1.15–1.40)
Calcium, Ion: <0.3 mmol/L — CL (ref 1.15–1.40)
Calcium, Ion: 0.32 mmol/L — CL (ref 1.15–1.40)
Calcium, Ion: 0.96 mmol/L — ABNORMAL LOW (ref 1.15–1.40)
Calcium, Ion: 0.36 mmol/L — CL (ref 1.15–1.40)
Calcium, Ion: 0.97 mmol/L — ABNORMAL LOW (ref 1.15–1.40)
Calcium, Ion: 0.32 mmol/L — CL (ref 1.15–1.40)
Calcium, Ion: 0.93 mmol/L — ABNORMAL LOW (ref 1.15–1.40)
Chloride: 101 mmol/L (ref 98–111)
Chloride: 99 mmol/L (ref 98–111)
Chloride: 101 mmol/L (ref 98–111)
Chloride: 101 mmol/L (ref 98–111)
Chloride: 100 mmol/L (ref 98–111)
Chloride: 100 mmol/L (ref 98–111)
Chloride: 100 mmol/L (ref 98–111)
Chloride: 99 mmol/L (ref 98–111)
Creatinine, Ser: 1.4 mg/dL — ABNORMAL HIGH (ref 0.61–1.24)
Creatinine, Ser: 2.1 mg/dL — ABNORMAL HIGH (ref 0.61–1.24)
Creatinine, Ser: 1.4 mg/dL — ABNORMAL HIGH (ref 0.61–1.24)
Creatinine, Ser: 2.1 mg/dL — ABNORMAL HIGH (ref 0.61–1.24)
Creatinine, Ser: 1.4 mg/dL — ABNORMAL HIGH (ref 0.61–1.24)
Creatinine, Ser: 2 mg/dL — ABNORMAL HIGH (ref 0.61–1.24)
Creatinine, Ser: 1.2 mg/dL (ref 0.61–1.24)
Creatinine, Ser: 2 mg/dL — ABNORMAL HIGH (ref 0.61–1.24)
Glucose, Bld: 253 mg/dL — ABNORMAL HIGH (ref 70–99)
Glucose, Bld: 259 mg/dL — ABNORMAL HIGH (ref 70–99)
Glucose, Bld: 208 mg/dL — ABNORMAL HIGH (ref 70–99)
Glucose, Bld: 223 mg/dL — ABNORMAL HIGH (ref 70–99)
Glucose, Bld: 181 mg/dL — ABNORMAL HIGH (ref 70–99)
Glucose, Bld: 193 mg/dL — ABNORMAL HIGH (ref 70–99)
Glucose, Bld: 289 mg/dL — ABNORMAL HIGH (ref 70–99)
Glucose, Bld: 296 mg/dL — ABNORMAL HIGH (ref 70–99)
HCT: 32 % — ABNORMAL LOW (ref 39.0–52.0)
HCT: 37 % — ABNORMAL LOW (ref 39.0–52.0)
HCT: 33 % — ABNORMAL LOW (ref 39.0–52.0)
HCT: 36 % — ABNORMAL LOW (ref 39.0–52.0)
HCT: 36 % — ABNORMAL LOW (ref 39.0–52.0)
HCT: 40 % (ref 39.0–52.0)
HCT: 36 % — ABNORMAL LOW (ref 39.0–52.0)
HCT: 39 % (ref 39.0–52.0)
Hemoglobin: 10.9 g/dL — ABNORMAL LOW (ref 13.0–17.0)
Hemoglobin: 12.6 g/dL — ABNORMAL LOW (ref 13.0–17.0)
Hemoglobin: 11.2 g/dL — ABNORMAL LOW (ref 13.0–17.0)
Hemoglobin: 12.2 g/dL — ABNORMAL LOW (ref 13.0–17.0)
Hemoglobin: 12.2 g/dL — ABNORMAL LOW (ref 13.0–17.0)
Hemoglobin: 13.6 g/dL (ref 13.0–17.0)
Hemoglobin: 12.2 g/dL — ABNORMAL LOW (ref 13.0–17.0)
Hemoglobin: 13.3 g/dL (ref 13.0–17.0)
Potassium: 4.1 mmol/L (ref 3.5–5.1)
Potassium: 4.4 mmol/L (ref 3.5–5.1)
Potassium: 3.8 mmol/L (ref 3.5–5.1)
Potassium: 3.9 mmol/L (ref 3.5–5.1)
Potassium: 3.8 mmol/L (ref 3.5–5.1)
Potassium: 3.9 mmol/L (ref 3.5–5.1)
Potassium: 4.2 mmol/L (ref 3.5–5.1)
Potassium: 4.8 mmol/L (ref 3.5–5.1)
Sodium: 135 mmol/L (ref 135–145)
Sodium: 138 mmol/L (ref 135–145)
Sodium: 136 mmol/L (ref 135–145)
Sodium: 137 mmol/L (ref 135–145)
Sodium: 136 mmol/L (ref 135–145)
Sodium: 139 mmol/L (ref 135–145)
Sodium: 134 mmol/L — ABNORMAL LOW (ref 135–145)
Sodium: 135 mmol/L (ref 135–145)
TCO2: 23 mmol/L (ref 22–32)
TCO2: 26 mmol/L (ref 22–32)
TCO2: 25 mmol/L (ref 22–32)
TCO2: 26 mmol/L (ref 22–32)
TCO2: 25 mmol/L (ref 22–32)
TCO2: 26 mmol/L (ref 22–32)
TCO2: 24 mmol/L (ref 22–32)
TCO2: 25 mmol/L (ref 22–32)

## 2020-07-30 LAB — BLOOD GAS, ARTERIAL
Acid-base deficit: 5 mmol/L — ABNORMAL HIGH (ref 0.0–2.0)
Acid-base deficit: 6 mmol/L — ABNORMAL HIGH (ref 0.0–2.0)
Acid-base deficit: 7.5 mmol/L — ABNORMAL HIGH (ref 0.0–2.0)
Bicarbonate: 22.8 mmol/L (ref 20.0–28.0)
Bicarbonate: 24.1 mmol/L (ref 20.0–28.0)
Bicarbonate: 20.6 mmol/L (ref 20.0–28.0)
FIO2: 80
FIO2: 80
FIO2: 80
O2 Saturation: 83 %
O2 Saturation: 93 %
O2 Saturation: 95.5 %
Patient temperature: 98.6
Patient temperature: 98.6
Patient temperature: 98.6
pCO2 arterial: 58.9 mmHg — ABNORMAL HIGH (ref 32.0–48.0)
pCO2 arterial: 75.5 mmHg (ref 32.0–48.0)
pCO2 arterial: 57.6 mmHg — ABNORMAL HIGH (ref 32.0–48.0)
pH, Arterial: 7.213 — ABNORMAL LOW (ref 7.350–7.450)
pH, Arterial: 7.13 — CL (ref 7.350–7.450)
pH, Arterial: 7.179 — CL (ref 7.350–7.450)
pO2, Arterial: 56.3 mmHg — ABNORMAL LOW (ref 83.0–108.0)
pO2, Arterial: 80.7 mmHg — ABNORMAL LOW (ref 83.0–108.0)
pO2, Arterial: 88.6 mmHg (ref 83.0–108.0)

## 2020-07-30 LAB — GLUCOSE, CAPILLARY
Glucose-Capillary: 179 mg/dL — ABNORMAL HIGH (ref 70–99)
Glucose-Capillary: 204 mg/dL — ABNORMAL HIGH (ref 70–99)
Glucose-Capillary: 169 mg/dL — ABNORMAL HIGH (ref 70–99)
Glucose-Capillary: 217 mg/dL — ABNORMAL HIGH (ref 70–99)
Glucose-Capillary: 250 mg/dL — ABNORMAL HIGH (ref 70–99)

## 2020-07-30 LAB — CALCIUM, IONIZED: Calcium, Ionized, Serum: 4.1 mg/dL — ABNORMAL LOW (ref 4.5–5.6)

## 2020-07-30 LAB — APTT: aPTT: 45 seconds — ABNORMAL HIGH (ref 24–36)

## 2020-07-30 LAB — HEPARIN LEVEL (UNFRACTIONATED)
Heparin Unfractionated: 0.24 IU/mL — ABNORMAL LOW (ref 0.30–0.70)
Heparin Unfractionated: 0.19 IU/mL — ABNORMAL LOW (ref 0.30–0.70)

## 2020-07-30 LAB — TRIGLYCERIDES: Triglycerides: 248 mg/dL — ABNORMAL HIGH (ref <150)

## 2020-07-30 MED ORDER — PIVOT 1.5 CAL PO LIQD
1000.0000 mL | ORAL | Status: DC
  Administered 2020-07-30 – 2020-08-04 (×6): 1000 mL
  Filled 2020-07-30 (×7): qty 1000

## 2020-07-30 MED ORDER — CISATRACURIUM BOLUS VIA INFUSION
0.0500 mg/kg | Freq: Once | INTRAVENOUS | Status: AC
  Administered 2020-07-30: 6 mg via INTRAVENOUS
  Filled 2020-07-30: qty 6

## 2020-07-30 MED ORDER — HEPARIN (PORCINE) 25000 UT/250ML-% IV SOLN
1850.0000 [IU]/h | INTRAVENOUS | Status: DC
  Administered 2020-07-30 – 2020-08-01 (×4): 1850 [IU]/h via INTRAVENOUS
  Filled 2020-07-30 (×3): qty 250

## 2020-07-30 MED ORDER — SODIUM CHLORIDE 0.9 % IV SOLN
0.0000 ug/kg/min | INTRAVENOUS | Status: DC
  Administered 2020-07-30: 3 ug/kg/min via INTRAVENOUS
  Administered 2020-07-30: 4.5 ug/kg/min via INTRAVENOUS
  Administered 2020-07-30: 5 ug/kg/min via INTRAVENOUS
  Administered 2020-07-31 (×2): 6.5 ug/kg/min via INTRAVENOUS
  Administered 2020-07-31: 5 ug/kg/min via INTRAVENOUS
  Administered 2020-07-31: 6 ug/kg/min via INTRAVENOUS
  Administered 2020-07-31: 6.5 ug/kg/min via INTRAVENOUS
  Administered 2020-08-01: 6 ug/kg/min via INTRAVENOUS
  Administered 2020-08-01 (×2): 6.5 ug/kg/min via INTRAVENOUS
  Administered 2020-08-01 – 2020-08-02 (×3): 6 ug/kg/min via INTRAVENOUS
  Administered 2020-08-02 (×2): 6.5 ug/kg/min via INTRAVENOUS
  Administered 2020-08-02 (×3): 6 ug/kg/min via INTRAVENOUS
  Administered 2020-08-03 – 2020-08-04 (×11): 6.5 ug/kg/min via INTRAVENOUS
  Administered 2020-08-05: 6 ug/kg/min via INTRAVENOUS
  Administered 2020-08-05 (×4): 6.5 ug/kg/min via INTRAVENOUS
  Administered 2020-08-06: 4.5 ug/kg/min via INTRAVENOUS
  Administered 2020-08-06: 6 ug/kg/min via INTRAVENOUS
  Administered 2020-08-06 (×2): 5 ug/kg/min via INTRAVENOUS
  Administered 2020-08-06: 6 ug/kg/min via INTRAVENOUS
  Administered 2020-08-07: 3.5 ug/kg/min via INTRAVENOUS
  Administered 2020-08-07: 4.5 ug/kg/min via INTRAVENOUS
  Filled 2020-07-30 (×81): qty 20

## 2020-07-30 MED ORDER — ARTIFICIAL TEARS OPHTHALMIC OINT
1.0000 "application " | TOPICAL_OINTMENT | Freq: Three times a day (TID) | OPHTHALMIC | Status: DC
  Administered 2020-07-30 – 2020-08-10 (×31): 1 via OPHTHALMIC
  Filled 2020-07-30: qty 3.5

## 2020-07-30 MED ORDER — HEPARIN (PORCINE) 25000 UT/250ML-% IV SOLN
1600.0000 [IU]/h | INTRAVENOUS | Status: DC
  Administered 2020-07-30: 1600 [IU]/h via INTRAVENOUS

## 2020-07-30 MED ORDER — PROSOURCE TF PO LIQD
90.0000 mL | Freq: Every day | ORAL | Status: DC
  Administered 2020-07-30 – 2020-08-05 (×8): 90 mL
  Filled 2020-07-30 (×8): qty 90

## 2020-07-30 NOTE — Progress Notes (Signed)
Nutrition Follow-up  DOCUMENTATION CODES:   Obesity unspecified  INTERVENTION:  - re-start Pivot 1.5 @ 20 ml/hr and advance by 10 ml in 12 hours to reach goal rate of 30 ml/hr with 90 ml Prosource TF x7/day.  - this regimen + kcal from current Propofol rate will provide 2889 kcal, 221 grams protein, and 546 ml free water.  - free water flush, if desired, to be per CCM.   NUTRITION DIAGNOSIS:   Increased nutrient needs related to acute illness, catabolic illness (FYBOF-75 infection) as evidenced by estimated needs. -ongoing  GOAL:   Provide needs based on ASPEN/SCCM guidelines -to be met with TF regimen  MONITOR:   Vent status, TF tolerance, Labs, Weight trends, I & O's  REASON FOR ASSESSMENT:   Consult Enteral/tube feeding initiation and management  ASSESSMENT:   64 year old male who quit smoking 2 weeks PTA. He presented to the ED due to 3 day hx of fever, feeling unwell, generalized weakness, and SOB. He family brought him in due to poor responsiveness with O2 of 45% in the ED. CXR showed evidence of bilateral interstitial and alveolar infiltrates. Patient is unvaccinated against COVID-19 and was diagnosed with COVID-19 this admission.  Significant Events: 8/2- admission; intubation; OGT placement; TF initiation 8/3- CRRT started early morning; initial RD assessment (NFPE completed) 8/5- severe shock 8/6- systemic TPA d/t suspected acute massive PE with acute cor pulmonale    Patient remains intubated with OGT in place. TF currently on hold in anticipation of proning early this afternoon. Will adjust TF regimen to decrease to trickle rate and re-advance slowly to promote tolerance once proned.   Patient continues on CRRT. Flow sheet documentation indicates moderate edema to all extremities. Weight 8/9, 8/10, and today all exactly the same--will continue to monitor.   Patient remains Full Code. Requiring intermittent paralytics. CCM notes that patient is not showing any  signs of improvement.   Patient is currently intubated on ventilator support MV: 13.7 L/min Temp (24hrs), Avg:97.7 F (36.5 C), Min:97.3 F (36.3 C), Max:98.4 F (36.9 C) Propofol: 47.3 ml/hr (1249 kcal)  Labs reviewed; CBGs: 179 and 204 mg/dl, BUN: 37 mg/dl, creatinine: 1.4 mg/dl, ionized Ca: 0.32 mmol/l, GFR: 36 ml/min.  Medications reviewed; 500 mcg ascorbic acid/day, 100 mg colace BID, sliding scale novolog, 10 units novolog every 4 hours, 30 units levemir BID, 1 tablet multivitamin with minerals/day, 17 g miralax/day, 50 mg deltasone/day, 220 mg zinc sulfate/day.  Drips; nimbex @ 3 mcg/kg/min, fentanyl @ 250 mcg/hr,  heparin @ 1600 units/hr, levo @ 2 mcg/min, propofol @ 70 mcg/kg/min.   Diet Order:   Diet Order            Diet NPO time specified  Diet effective now                 EDUCATION NEEDS:   No education needs have been identified at this time  Skin:  Skin Assessment: Reviewed RN Assessment  Last BM:  8/2  Height:   Ht Readings from Last 1 Encounters:  07/24/20 5' 9"  (1.753 m)    Weight:   Wt Readings from Last 1 Encounters:  07/30/20 120 kg     Estimated Nutritional Needs:  Kcal:  2550-2763 kcal (30-32.5 kcal/kg adjusted wt) Protein:  >/= 202 grams (1.8 grams/kg actual weight) Fluid:  >/= 2.2 L/day     Jarome Matin, MS, RD, LDN, CNSC Inpatient Clinical Dietitian RD pager # available in [facility]  After hours/weekend pager # available in [facility]

## 2020-07-30 NOTE — Progress Notes (Signed)
CRITICAL VALUE ALERT  Critical Value: pC02 75.5, pH 7.1  Date & Time Notied:  07/30/20 1533  Provider Notified: Madelin Headings  Orders Received/Actions taken: Vent Changes, see flowsheet.

## 2020-07-30 NOTE — Progress Notes (Signed)
eLink Physician-Brief Progress Note Patient Name: Alex [name] DOB: 01-04-1956 MRN: [id number]   Date of Service  07/30/2020  HPI/Events of Note  Frequent watery stools - Request for Flexiseal.   eICU Interventions  Plan: 1. Place Flexiseal.      Intervention Category Major Interventions: Other:  Lysle Dingwall 07/30/2020, 10:59 PM

## 2020-07-30 NOTE — Progress Notes (Signed)
[facility] Progress Note  Subjective: 2.1 L net neg yest. FiO2 up to 80%.  CXR 8/10 ?edema.   Vitals:   07/30/20 0630 07/30/20 0645 07/30/20 0700 07/30/20 0833  BP:   137/73   Pulse: 91 90 93 96  Resp: 18 (!) 25 (!) 25 (!) 23  Temp: 98.2 F (36.8 C) 98.2 F (36.8 C) 98.2 F (36.8 C) 98.2 F (36.8 C)  TempSrc:      SpO2: 100% 100% 100% 96%  Weight:      Height:        Exam:  Patient not examined today directly given COVID-19 + status, utilizing data taken from chart +/- discussions w/ providers and staff.     CXR 8/8 > IMPRESSION: Appliances appear in satisfactory location. Cardiac enlargement with bilateral perihilar and basilar airspace infiltrates, likely edema.   Renal US 8/2 - 13-14 cm kidneys w/o hydro, +^'d echo seen w/ CKD   UA 8/2 - >300 prot, 20-50 rbc, 6-10 wbc, 0-5 epi   UP/C ratio = e.e   UNa 32, UCr 147  Assessment/ Plan: 1. Renal failure - no old data, hx of "kidney disease" per patient at time of admission. Creat 8.4 on admit 8/2, +proteinuria 3 gm and microhematuria on admit. Renal US normal size kidneys w/ ^'d echo seen w/ chronic disease.  CRRT started 8/3. HD cath malfunction resolved w/ repositioning 8/7, working well now. Cont citrate protocol. ^ Wt's / vol / CVP/ edema persist and FiO2 worse, will ^UF to 200 cc/hr as tolerated.  2. Hyperkalemia - resolved 3. Metabolic acidosis - better, CO2 24, pH 7.2 w/ pCO2 58. Cont citrate dialysate (low bicarb, no Ca).  4. COVID PNA - w/ vent dependence, sp IV steroids/ remdesivir, on pred 50/d po now. FiO2 up as above.  5. Hypotension/ shock - stable, on levo gtt only 6. Anemia - Hb 10.7, mild decline 7. Suspected acute PE w/ cor pulmonale - sp systemic TPA on 8/6 and on getting full dose IV systemic heparin 8. Acute RLE DVT - as above 9. DM/ hyperglycemia - on SQ insulin per CCM     Rob [place] 07/30/2020, 8:57 AM   Recent Labs  Lab 07/29/20 1440 07/29/20 1952 07/30/20 0400 07/30/20 0400  07/30/20 0411 07/30/20 0418  K 4.3   < > 3.9   < > 3.9 3.8  BUN 47*   < > 53*   < > 47* 37*  CREATININE 2.21*   < > 2.18*   < > 2.10* 1.40*  CALCIUM 7.4*  --  7.3*  --   --   --   PHOS 4.2  --  4.4  --   --   --   HGB  --    < > 10.5*   < > 11.2* 12.2*   < > = values in this interval not displayed.   Inpatient medications: . vitamin C  500 mg Per Tube Daily  . chlorhexidine gluconate (MEDLINE KIT)  15 mL Mouth Rinse BID  . Chlorhexidine Gluconate Cloth  6 each Topical Daily  . docusate  100 mg Per Tube BID  . feeding supplement (PIVOT 1.5 CAL)  1,000 mL Per Tube Q24H  . feeding supplement (PROSource TF)  90 mL Per Tube 6 X Daily  . insulin aspart  0-20 Units Subcutaneous Q4H  . insulin aspart  10 Units Subcutaneous Q4H  . insulin detemir  30 Units Subcutaneous BID  . mouth rinse  15 mL Mouth Rinse  10 times per day  . multivitamin with minerals  1 tablet Oral Daily  . pantoprazole (PROTONIX) IV  40 mg Intravenous Daily  . polyethylene glycol  17 g Per Tube Daily  . predniSONE  50 mg Per Tube Q breakfast  . sodium chloride flush  3 mL Intravenous Q12H  . zinc sulfate  220 mg Per Tube Daily   .  prismasol BGK 4/2.5 400 mL/hr at 07/29/20 2017  . sodium chloride 10 mL/hr at 07/30/20 0800  . calcium gluconate infusion for CRRT 20 g (07/30/20 0517)  . citrate dextrose 1,000 mL (07/30/20 0832)  . DOPamine Stopped (07/25/20 2244)  . fentaNYL infusion INTRAVENOUS 225 mcg/hr (07/30/20 0800)  . heparin 1,600 Units/hr (07/30/20 0843)  . norepinephrine (LEVOPHED) Adult infusion 4 mcg/min (07/30/20 0800)  . prismasol B22GK 4/0 2,200 mL/hr at 07/30/20 0832  . propofol (DIPRIVAN) infusion 50 mcg/kg/min (07/30/20 0802)  . vasopressin Stopped (07/24/20 2304)   sodium chloride, acetaminophen, alteplase, EPINEPHrine, fentaNYL, heparin, heparin, midazolam, ondansetron **OR** ondansetron (ZOFRAN) IV, sennosides, sodium chloride, vecuronium

## 2020-07-30 NOTE — Progress Notes (Signed)
NAME:  Alex [name], MRN:  [id number], DOB:  [birth year], LOS: 3 ADMISSION DATE:  08/12/2020, CONSULTATION DATE:  8/1 REFERRING MD:  Sedonia Small, CHIEF COMPLAINT:  Dyspnea   Brief History   65 y/o male with acute hypoxemic respiratory failure due to COVID 19 pneumonia admitted on 8/1 Day 10 on vent Day 9 of CRRT  Past Medical History  Cigarette smoker DM2 Hypertension CKD?  Significant Hospital Events   8/1 admission 8/2 intubation 8/3 startED CRRT 8/5 significant improvement in oxygenation; mid morning developed severe sudden shock of uncertain etiology, low grade temp 8/10 no overnight events 8/11 still significantly hypoxemic, requiring intermittent paralytics  Consults:  Nephrology  Procedures:  8/2 ETT >  8/2 L IJ CVL >  8/3 R IJ HD cath >   Significant Diagnostic Tests:  8/2 lower ext doppler/vascular ultrasound >> acute R DVT peroneal vein, left negative 8/2 renal ultrasound >> no hydro, findings consistent with medicorenal disease  Micro Data:  8/1 SARS COV 2 > positive 8/1 blood -negative  8/5 blood >  8/5 resp >   Antimicrobials/COVID Rx:  8/1 ceftriaxone > 8/5-8/7 8/1 remdesivir -completed 8/5 8/1 tocilizumab   Interim history/subjective:   Dialysis catheter pulled back overnight due to low flows, now running well. Spent most of the previous 24 hours off CRRT. Continues on CRRT and seems to be doing okay Continues with some ventilator dyssynchrony Ventilator requirements worse, back up to 80% PEEP of 14  Objective   Blood pressure 137/73, pulse 93, temperature 98.2 F (36.8 C), resp. rate (!) 25, height 5\' 9"  (1.753 m), weight 120 kg, SpO2 100 %. CVP:  [6 mmHg-12 mmHg] 11 mmHg  Vent Mode: PRVC FiO2 (%):  [50 %-80 %] 80 % Set Rate:  [20 bmp-25 bmp] 25 bmp Vt Set:  [560 mL] 560 mL PEEP:  [12 cmH20-14 cmH20] 14 cmH20 Plateau Pressure:  [28 cmH20-32 cmH20] 32 cmH20   Intake/Output Summary (Last 24 hours) at 07/30/2020 0825 Last data filed at 07/30/2020  0700 Gross per 24 hour  Intake 5007.66 ml  Output 6391 ml  Net -1383.34 ml   Filed Weights   07/28/20 0500 07/29/20 0445 07/30/20 0500  Weight: 120 kg 120 kg 120 kg    Examination: General: Sedated, intermittent paralytics HENT: Anicteric PULM: Rhonchi bilaterally, ventilator dyssynchrony CV: RRR, S1-S2 appreciated GI: obese, soft, NT MSK: LE edema, no clubbing or cyanosis Neuro: RASS -5, PERRL, no withdrawal from pain   8/8 CXR-persistent bilateral opacities, ET tube and dialysis catheter in appropriate position.  -Reviewed by myself  Resolved Hospital Problem list    Assessment & Plan:  ARDS due to COVID 19 pneumonia > worsening oxygenation on 8/6 Acute pulmonary edema due to acute renal failure Likely acute massive PE with acute cor pulmonale-> s/p systemic TPA on 8/6. -Con't LTVV, plateau pressure at goal. -Daily SAT and SBT when appropriate- O2 requirements too high for SBT.   -Continuing CRRT -Continue steroids -Broad-spectrum antibiotics discontinued -Anticoagulation continued for massive PE -Unfortunately not showing any signs of significant improvement -ABG noted still with significant hypoxemia  Severe shock midmorning 8/5, still persists Periodic bradycardia> no clear source, possibly due to hypothermia -Pressors as required -Continues on norepinephrine -Low-dose dopamine -Continue anticoagulation  Acute DVT R leg -Continue heparin infusion  AKI with hyperkalemia BPH Metabolic acidosis-stable -CRRT per nephrology -Strict I's/O -Renally dose meds and avoid nephrotoxic meds  Hyperglycemia-better controlled today -Levemir increased to 30 units twice daily -Sliding scale insulin as needed -Continue monitoring  Need for sedation for mechanical ventilation -RASS target: -5 -Continue versed prn, fentanyl infusion per PAD protocol, propofol  ABG not showing significant signs of improvement No renal recovery Guarded prognosis Will update  family Will likely need tracheostomy Paralyze and in prone today  Discussed prognosis with patient's brother today The fact that the patient will likely need a tracheostomy in the next few days No significant improvement noted so far He will update the rest of the family  Best practice:  Diet: tube feeds Pain/Anxiety/Delirium protocol (if indicated): On fentanyl, propofol VAP protocol (if indicated): Per protocol DVT prophylaxis: heparin infusion GI prophylaxis: Protonix Glucose control: basal bolus insulin Mobility: bed rest Code Status: full Family Communication: brother Herbie [place] updated 8/11 Disposition: remain in ICU  Labs   CBC: Recent Labs  Lab 07/24/20 0359 07/24/20 1202 07/25/20 0212 07/25/20 1830 07/26/20 0400 07/26/20 2123 07/27/20 1055 07/27/20 1243 07/28/20 0409 07/28/20 1008 07/29/20 0355 07/29/20 1117 07/29/20 1952 07/29/20 1959 07/30/20 0400 07/30/20 0411 07/30/20 0418  WBC 12.1*   < > 15.6*   < > 21.3*  --  20.7*  --  23.3*  --  25.8*  --   --   --  25.8*  --   --   NEUTROABS 10.4*  --  12.8*  --  16.9*  --   --   --   --   --   --   --   --   --   --   --   --   HGB 14.0   < > 12.1*   < > 11.9*   < > 10.3*   < > 10.4*   < > 10.7*   < > 12.6* 10.9* 10.5* 11.2* 12.2*  HCT 43.0   < > 35.2*   < > 36.1*   < > 33.4*   < > 35.4*   < > 34.4*   < > 37.0* 32.0* 34.0* 33.0* 36.0*  MCV 87.6   < > 82.8   < > 86.6  --  91.5  --  95.9  --  91.2  --   --   --  91.6  --   --   PLT 174   < > 157   < > 196  --  212  --  231  --  223  --   --   --  238  --   --    < > = values in this interval not displayed.    Basic Metabolic Panel: Recent Labs  Lab 07/26/20 0400 07/26/20 1428 07/27/20 1055 07/27/20 1243 07/28/20 0409 07/28/20 1008 07/28/20 1546 07/28/20 1903 07/29/20 0355 07/29/20 1117 07/29/20 1440 07/29/20 1440 07/29/20 1952 07/29/20 1959 07/30/20 0400 07/30/20 0411 07/30/20 0418  NA 136  135   < > 140  141   < > 137   < > 142   < > 138   < >  138   < > 138 135 135 136 137  K 3.9  3.9   < > 4.7  4.8   < > 5.5*   < > 5.0   < > 3.9   < > 4.3   < > 4.1 4.4 3.9 3.9 3.8  CL 92*  92*   < > 109  108   < > 113*   < > 112*   < > 101   < > 100   < > 99 101 99 101 101  CO2 25  26   < >  20*  20*   < > 13*  --  21*  --  22  --  24  --   --   --  24  --   --   GLUCOSE 217*  217*   < > 180*  178*   < > 213*   < > 259*   < > 230*   < > 237*   < > 259* 253* 209* 208* 223*  BUN 95*  97*   < > 78*  78*   < > 67*   < > 59*   < > 52*   < > 47*   < > 37* 51* 53* 47* 37*  CREATININE 4.42*  4.16*   < > 4.16*  4.26*   < > 3.10*   < > 2.78*   < > 2.45*   < > 2.21*   < > 1.40* 2.10* 2.18* 2.10* 1.40*  CALCIUM 7.3*  7.2*   < > 6.9*  6.8*   < > 6.5*  --  7.9*  --  8.4*  --  7.4*  --   --   --  7.3*  --   --   MG 2.4  --  2.4  --  2.1  --   --   --  2.1  --   --   --   --   --  2.2  --   --   PHOS 5.4*   < > 5.1*   < > 5.1*  --  4.7*  --  4.1  --  4.2  --   --   --  4.4  --   --    < > = values in this interval not displayed.   GFR: Estimated Creatinine Clearance: 69.1 mL/min (A) (by C-G formula based on SCr of 1.4 mg/dL (H)). Recent Labs  Lab 07/24/20 0359 07/24/20 1130 07/24/20 1202 07/24/20 1928 07/25/20 0212 07/25/20 2106 07/26/20 0400 07/27/20 1055 07/28/20 0409 07/29/20 0355 07/30/20 0400  WBC   < >  --  33.6*  --    < >  --    < > 20.7* 23.3* 25.8* 25.8*  LATICACIDVEN  --  8.4* >11.0* 3.6*  --  2.6*  --   --   --   --   --    < > = values in this interval not displayed.    Liver Function Tests: Recent Labs  Lab 07/24/20 0359 07/24/20 0359 07/24/20 1202 07/24/20 1600 07/25/20 0212 07/25/20 1600 07/26/20 0400 07/26/20 1428 07/28/20 0409 07/28/20 1546 07/29/20 0355 07/29/20 1440 07/30/20 0400  AST 85*  --  163*  --  184*  --  105*  --   --   --   --   --   --   ALT 38  --  91*  --  116*  --  103*  --   --   --   --   --   --   ALKPHOS 78  --  85  --  71  --  75  --   --   --   --   --   --   BILITOT 0.3  --  0.5  --   0.3  --  0.6  --   --   --   --   --   --   PROT 6.0*  --  5.2*  --  4.7*  --  4.6*  --   --   --   --   --   --  ALBUMIN 2.2*  2.3*   < > 1.9*   < > 1.7*  1.7*   < > 1.9*  1.9*   < > 2.1* 2.1* 2.2* 2.1* 2.3*   < > = values in this interval not displayed.   No results for input(s): LIPASE, AMYLASE in the last 168 hours. No results for input(s): AMMONIA in the last 168 hours.  ABG    Component Value Date/Time   PHART 7.238 (L) 07/29/2020 1252   PCO2ART 55.4 (H) 07/29/2020 1252   PO2ART 59.4 (L) 07/29/2020 1252   HCO3 22.8 07/29/2020 1252   TCO2 25 07/30/2020 0418   ACIDBASEDEF 4.5 (H) 07/29/2020 1252   O2SAT 85.6 07/29/2020 1252     Coagulation Profile: Recent Labs  Lab 07/25/20 1830  INR 1.2    Cardiac Enzymes: Recent Labs  Lab 07/24/20 1202  CKTOTAL 2,124*  CKMB 14.0*    HbA1C: Hgb A1c MFr Bld  Date/Time Value Ref Range Status  07/23/2020 09:36 AM 12.3 (H) 4.8 - 5.6 % Final    Comment:    (NOTE) Pre diabetes:          5.7%-6.4%  Diabetes:              >6.4%  Glycemic control for   <7.0% adults with diabetes     CBG: Recent Labs  Lab 07/29/20 1127 07/29/20 1514 07/29/20 1959 07/29/20 2357 07/30/20 0417  GLUCAP 180* 223* 232* 197* 179*    The patient is critically ill with multiple organ systems failure and requires high complexity decision making for assessment and support, frequent evaluation and titration of therapies, application of advanced monitoring technologies and extensive interpretation of multiple databases. Critical Care Time devoted to patient care services described in this note independent of APP/resident time (if applicable)  is 45 minutes.   Sherrilyn Rist MD [facility] Personal pager: [phone] If unanswered, please page CCM On-call: [phone]

## 2020-07-30 NOTE — Progress Notes (Signed)
CRITICAL VALUE ALERT  Critical Value:  PH 7.1  Date & Time Notied:  07/30/20   Provider Notified: A. Ander Slade, MD  Orders Received/Actions taken: See new orders.

## 2020-07-30 NOTE — Progress Notes (Signed)
ANTICOAGULATION CONSULT NOTE - Follow Up Consult  Pharmacy Consult for Heparin Indication: acute DVT, presumed PE  No Known Allergies  Patient Measurements: Height: 5\' 9"  (175.3 cm) Weight: 120 kg (264 lb 8.8 oz) IBW/kg (Calculated) : 70.7 Heparin Dosing Weight: 95 kg  Vital Signs: Temp: 98.1 F (36.7 C) (08/11 0600) Temp Source: Bladder (08/11 0600) BP: 133/71 (08/11 0600) Pulse Rate: 92 (08/11 0600)  Labs: Recent Labs    07/28/20 0409 07/28/20 1008 07/28/20 1546 07/28/20 1903 07/29/20 0355 07/29/20 1117 07/30/20 0400 07/30/20 0400 07/30/20 0411 07/30/20 0418  HGB 10.4*   < >  --    < > 10.7*   < > 10.5*   < > 11.2* 12.2*  HCT 35.4*   < >  --    < > 34.4*   < > 34.0*  --  33.0* 36.0*  PLT 231  --   --   --  223  --  238  --   --   --   APTT 66*  --   --   --  67*  --  45*  --   --   --   HEPARINUNFRC 0.73*  --  0.68  --  0.68  --  0.24*  --   --   --   CREATININE 3.10*   < > 2.78*   < > 2.45*   < > 2.18*  --  2.10* 1.40*   < > = values in this interval not displayed.    Estimated Creatinine Clearance: 69.1 mL/min (A) (by C-G formula based on SCr of 1.4 mg/dL (H)).  Assessment: 64 yo male with COVID PNA, now on CRRT, D-dimer rose from 7 to 17 to start IV heparin per Rx dosing for acute R DVT peroneal vein.   Significant Events: -8/6: Pt had bradycardia, hypotension. Pt given alteplase 50 mg IV infusion over 2 hrs for PE @ 16:08 -8/6: Heparin infusion resumed @ 21:41 s/p TPA with aPTT <80  Today, 07/30/20  Heparin level subtherapeutic (0.24) on heparin 1500 units/hr  CBC: Hgb low but improved, Plt WNL and stable  No issues with bleeding or problems with infusion per d/w RN.   Goal of Therapy:  HL 0.3 - 0.7 units/ml  Monitor platelets by anticoagulation protocol: Yes   Plan:   Increase IV heparin to 1600 units/hr  Recheck heparin level in 8hrs  Daily heparin level and CBC while on heparin  Monitor for signs/symptoms of bleeding  Peggyann Juba,  PharmD, BCPS Pharmacy: [phone] 07/30/2020 6:39 AM

## 2020-07-30 NOTE — Progress Notes (Signed)
Brief Pharmacy Consult Note - IV heparin follow up  Labs: heparin level 0.19  A/P: heparin level SUBtherapeutic and decreased (goal 0.3-0.7) despite increase in heparin rate from 1500 to 1600 units/hr earlier today. Per RN, no issues or bleeding noted. Increase IV heparin from 1600 to 1850 units/hr. Will recheck heparin level 8 hours after rate increase.  Adrian Saran, PharmD, BCPS 07/30/2020 4:51 PM

## 2020-07-31 ENCOUNTER — Inpatient Hospital Stay (HOSPITAL_COMMUNITY): Payer: Medicaid Other

## 2020-07-31 DIAGNOSIS — U071 COVID-19: Secondary | ICD-10-CM | POA: Diagnosis not present

## 2020-07-31 DIAGNOSIS — J9601 Acute respiratory failure with hypoxia: Secondary | ICD-10-CM | POA: Diagnosis not present

## 2020-07-31 LAB — POCT I-STAT, CHEM 8
BUN: 35 mg/dL — ABNORMAL HIGH (ref 8–23)
BUN: 46 mg/dL — ABNORMAL HIGH (ref 8–23)
BUN: 69 mg/dL — ABNORMAL HIGH (ref 8–23)
BUN: 38 mg/dL — ABNORMAL HIGH (ref 8–23)
BUN: 46 mg/dL — ABNORMAL HIGH (ref 8–23)
BUN: 37 mg/dL — ABNORMAL HIGH (ref 8–23)
Calcium, Ion: 0.36 mmol/L — CL (ref 1.15–1.40)
Calcium, Ion: 0.96 mmol/L — ABNORMAL LOW (ref 1.15–1.40)
Calcium, Ion: 0.98 mmol/L — ABNORMAL LOW (ref 1.15–1.40)
Calcium, Ion: 0.34 mmol/L — CL (ref 1.15–1.40)
Calcium, Ion: 0.93 mmol/L — ABNORMAL LOW (ref 1.15–1.40)
Calcium, Ion: <0.3 mmol/L — CL (ref 1.15–1.40)
Chloride: 98 mmol/L (ref 98–111)
Chloride: 98 mmol/L (ref 98–111)
Chloride: 100 mmol/L (ref 98–111)
Chloride: 99 mmol/L (ref 98–111)
Chloride: 104 mmol/L (ref 98–111)
Chloride: 99 mmol/L (ref 98–111)
Creatinine, Ser: 1.2 mg/dL (ref 0.61–1.24)
Creatinine, Ser: 1.9 mg/dL — ABNORMAL HIGH (ref 0.61–1.24)
Creatinine, Ser: 1.9 mg/dL — ABNORMAL HIGH (ref 0.61–1.24)
Creatinine, Ser: 1.3 mg/dL — ABNORMAL HIGH (ref 0.61–1.24)
Creatinine, Ser: 1.8 mg/dL — ABNORMAL HIGH (ref 0.61–1.24)
Creatinine, Ser: 1.1 mg/dL (ref 0.61–1.24)
Glucose, Bld: 212 mg/dL — ABNORMAL HIGH (ref 70–99)
Glucose, Bld: 231 mg/dL — ABNORMAL HIGH (ref 70–99)
Glucose, Bld: 181 mg/dL — ABNORMAL HIGH (ref 70–99)
Glucose, Bld: 210 mg/dL — ABNORMAL HIGH (ref 70–99)
Glucose, Bld: 292 mg/dL — ABNORMAL HIGH (ref 70–99)
Glucose, Bld: 291 mg/dL — ABNORMAL HIGH (ref 70–99)
HCT: 34 % — ABNORMAL LOW (ref 39.0–52.0)
HCT: 37 % — ABNORMAL LOW (ref 39.0–52.0)
HCT: 36 % — ABNORMAL LOW (ref 39.0–52.0)
HCT: 41 % (ref 39.0–52.0)
HCT: 35 % — ABNORMAL LOW (ref 39.0–52.0)
HCT: 42 % (ref 39.0–52.0)
Hemoglobin: 11.6 g/dL — ABNORMAL LOW (ref 13.0–17.0)
Hemoglobin: 12.6 g/dL — ABNORMAL LOW (ref 13.0–17.0)
Hemoglobin: 12.2 g/dL — ABNORMAL LOW (ref 13.0–17.0)
Hemoglobin: 13.9 g/dL (ref 13.0–17.0)
Hemoglobin: 11.9 g/dL — ABNORMAL LOW (ref 13.0–17.0)
Hemoglobin: 14.3 g/dL (ref 13.0–17.0)
Potassium: 3.9 mmol/L (ref 3.5–5.1)
Potassium: 4.3 mmol/L (ref 3.5–5.1)
Potassium: 4 mmol/L (ref 3.5–5.1)
Potassium: 3.9 mmol/L (ref 3.5–5.1)
Potassium: 4.4 mmol/L (ref 3.5–5.1)
Potassium: 4.4 mmol/L (ref 3.5–5.1)
Sodium: 137 mmol/L (ref 135–145)
Sodium: 137 mmol/L (ref 135–145)
Sodium: 135 mmol/L (ref 135–145)
Sodium: 138 mmol/L (ref 135–145)
Sodium: 133 mmol/L — ABNORMAL LOW (ref 135–145)
Sodium: 136 mmol/L (ref 135–145)
TCO2: 24 mmol/L (ref 22–32)
TCO2: 25 mmol/L (ref 22–32)
TCO2: 25 mmol/L (ref 22–32)
TCO2: 25 mmol/L (ref 22–32)
TCO2: 22 mmol/L (ref 22–32)
TCO2: 23 mmol/L (ref 22–32)

## 2020-07-31 LAB — RENAL FUNCTION PANEL
Albumin: 2.2 g/dL — ABNORMAL LOW (ref 3.5–5.0)
Albumin: 2.4 g/dL — ABNORMAL LOW (ref 3.5–5.0)
Anion gap: 13 (ref 5–15)
Anion gap: 13 (ref 5–15)
BUN: 50 mg/dL — ABNORMAL HIGH (ref 8–23)
BUN: 52 mg/dL — ABNORMAL HIGH (ref 8–23)
CO2: 22 mmol/L (ref 22–32)
CO2: 20 mmol/L — ABNORMAL LOW (ref 22–32)
Calcium: 7.2 mg/dL — ABNORMAL LOW (ref 8.9–10.3)
Calcium: 7.3 mg/dL — ABNORMAL LOW (ref 8.9–10.3)
Chloride: 97 mmol/L — ABNORMAL LOW (ref 98–111)
Chloride: 99 mmol/L (ref 98–111)
Creatinine, Ser: 1.79 mg/dL — ABNORMAL HIGH (ref 0.61–1.24)
Creatinine, Ser: 1.89 mg/dL — ABNORMAL HIGH (ref 0.61–1.24)
GFR calc Af Amer: 46 mL/min — ABNORMAL LOW (ref >60)
GFR calc Af Amer: 43 mL/min — ABNORMAL LOW (ref >60)
GFR calc non Af Amer: 39 mL/min — ABNORMAL LOW (ref >60)
GFR calc non Af Amer: 37 mL/min — ABNORMAL LOW (ref >60)
Glucose, Bld: 206 mg/dL — ABNORMAL HIGH (ref 70–99)
Glucose, Bld: 284 mg/dL — ABNORMAL HIGH (ref 70–99)
Phosphorus: 4.7 mg/dL — ABNORMAL HIGH (ref 2.5–4.6)
Phosphorus: 6.1 mg/dL — ABNORMAL HIGH (ref 2.5–4.6)
Potassium: 4.3 mmol/L (ref 3.5–5.1)
Potassium: 4.6 mmol/L (ref 3.5–5.1)
Sodium: 132 mmol/L — ABNORMAL LOW (ref 135–145)
Sodium: 132 mmol/L — ABNORMAL LOW (ref 135–145)

## 2020-07-31 LAB — HEPARIN LEVEL (UNFRACTIONATED)
Heparin Unfractionated: 0.45 IU/mL (ref 0.30–0.70)
Heparin Unfractionated: 0.44 IU/mL (ref 0.30–0.70)
Heparin Unfractionated: 0.63 IU/mL (ref 0.30–0.70)

## 2020-07-31 LAB — GLUCOSE, CAPILLARY
Glucose-Capillary: 178 mg/dL — ABNORMAL HIGH (ref 70–99)
Glucose-Capillary: 200 mg/dL — ABNORMAL HIGH (ref 70–99)
Glucose-Capillary: 204 mg/dL — ABNORMAL HIGH (ref 70–99)
Glucose-Capillary: 208 mg/dL — ABNORMAL HIGH (ref 70–99)
Glucose-Capillary: 210 mg/dL — ABNORMAL HIGH (ref 70–99)
Glucose-Capillary: 216 mg/dL — ABNORMAL HIGH (ref 70–99)
Glucose-Capillary: 273 mg/dL — ABNORMAL HIGH (ref 70–99)

## 2020-07-31 LAB — CBC
HCT: 34 % — ABNORMAL LOW (ref 39.0–52.0)
Hemoglobin: 10.5 g/dL — ABNORMAL LOW (ref 13.0–17.0)
MCH: 28.5 pg (ref 26.0–34.0)
MCHC: 30.9 g/dL (ref 30.0–36.0)
MCV: 92.4 fL (ref 80.0–100.0)
Platelets: 222 10*3/uL (ref 150–400)
RBC: 3.68 MIL/uL — ABNORMAL LOW (ref 4.22–5.81)
RDW: 16.3 % — ABNORMAL HIGH (ref 11.5–15.5)
WBC: 21.9 10*3/uL — ABNORMAL HIGH (ref 4.0–10.5)
nRBC: 2.1 % — ABNORMAL HIGH (ref 0.0–0.2)

## 2020-07-31 LAB — MAGNESIUM: Magnesium: 2.2 mg/dL (ref 1.7–2.4)

## 2020-07-31 LAB — BLOOD GAS, ARTERIAL
Acid-base deficit: 5.8 mmol/L — ABNORMAL HIGH (ref 0.0–2.0)
Bicarbonate: 22.6 mmol/L (ref 20.0–28.0)
FIO2: 80
MECHVT: 560 mL
O2 Saturation: 94.7 %
PEEP: 14 cmH2O
Patient temperature: 98.6
RATE: 32 resp/min
pCO2 arterial: 62 mmHg — ABNORMAL HIGH (ref 32.0–48.0)
pH, Arterial: 7.188 — CL (ref 7.350–7.450)
pO2, Arterial: 83.4 mmHg (ref 83.0–108.0)

## 2020-07-31 LAB — APTT: aPTT: 64 seconds — ABNORMAL HIGH (ref 24–36)

## 2020-07-31 LAB — CALCIUM, IONIZED: Calcium, Ionized, Serum: 3.5 mg/dL — ABNORMAL LOW (ref 4.5–5.6)

## 2020-07-31 MED ORDER — AMIODARONE HCL IN DEXTROSE 360-4.14 MG/200ML-% IV SOLN
INTRAVENOUS | Status: AC
  Filled 2020-07-31: qty 200

## 2020-07-31 MED ORDER — AMIODARONE LOAD VIA INFUSION: 150.0000 mg | Freq: Once | INTRAVENOUS | Status: DC

## 2020-07-31 MED ORDER — AMIODARONE HCL IN DEXTROSE 360-4.14 MG/200ML-% IV SOLN
30.0000 mg/h | INTRAVENOUS | Status: DC
  Administered 2020-07-31: 30 mg/h via INTRAVENOUS

## 2020-07-31 MED ORDER — AMIODARONE HCL IN DEXTROSE 360-4.14 MG/200ML-% IV SOLN: 60.0000 mg/h | INTRAVENOUS | Status: DC

## 2020-07-31 MED ORDER — AMIODARONE LOAD VIA INFUSION
150.0000 mg | Freq: Once | INTRAVENOUS | Status: AC
  Filled 2020-07-31: qty 83.34

## 2020-07-31 MED ORDER — AMIODARONE HCL IN DEXTROSE 360-4.14 MG/200ML-% IV SOLN: 30.0000 mg/h | INTRAVENOUS | Status: DC

## 2020-07-31 MED ORDER — AMIODARONE IV BOLUS ONLY 150 MG/100ML
INTRAVENOUS | Status: AC
  Administered 2020-07-31: 150 mg
  Filled 2020-07-31: qty 100

## 2020-07-31 MED ORDER — INSULIN ASPART 100 UNIT/ML ~~LOC~~ SOLN
12.0000 [IU] | SUBCUTANEOUS | Status: DC
  Administered 2020-07-31 – 2020-08-05 (×26): 12 [IU] via SUBCUTANEOUS

## 2020-07-31 MED ORDER — AMIODARONE HCL IN DEXTROSE 360-4.14 MG/200ML-% IV SOLN
60.0000 mg/h | INTRAVENOUS | Status: AC
  Administered 2020-07-31: 60 mg/h via INTRAVENOUS
  Filled 2020-07-31: qty 200

## 2020-07-31 NOTE — Progress Notes (Signed)
Pt into a-fib RVR rates up to 140s. EKG obtained. MD notified. Orders received for amioderone bolus followed by continuous infusion. Orders carried out.

## 2020-07-31 NOTE — Progress Notes (Signed)
eLink Physician-Brief Progress Note Patient Name: Alex [name] DOB: May 08, 1956 MRN: [id number]   Date of Service  07/31/2020  HPI/Events of Note  Pt with Afib with RVR in the 140's for which Amiodarone was started, he had a pause on Amio then converted to sinus bradycardia at a rate of 59, patient has had a problem in the recent past with symptomatic bradycardia requiring Atropine.  eICU Interventions  Bedside RN instructed to pause the Amio infusion but keep it hanging so that is he goes back into Afib with RVR it can be promptly re-started.        Kerry Kass Sherre Wooton 07/31/2020, 11:10 PM

## 2020-07-31 NOTE — Progress Notes (Signed)
NAME:  Alex [name], MRN:  [id number], DOB:  06-12-1956, LOS: 73 ADMISSION DATE:  07/24/2020, CONSULTATION DATE:  8/1 REFERRING MD:  Sedonia Small, CHIEF COMPLAINT:  Dyspnea   Brief History   64 y/o male with acute hypoxemic respiratory failure due to COVID 19 pneumonia admitted on 8/1 Day 10 on vent Day 9 of CRRT  Past Medical History  Cigarette smoker DM2 Hypertension CKD?  Significant Hospital Events   8/1 admission 8/2 intubation 8/3 started CRRT 8/5 significant improvement in oxygenation; mid morning developed severe sudden shock of uncertain etiology, low grade temp 8/6 Given systemic TPA due to repeated episodes of shock and cardiac instability and concern for PE 8/11 still significantly hypoxemic, requiring intermittent paralytics.  Resume proning  Consults:  Nephrology  Procedures:  8/2 ETT >  8/2 L IJ CVL >  8/3 R IJ HD cath >   Significant Diagnostic Tests:  8/2 lower ext doppler/vascular ultrasound >> acute R DVT peroneal vein, left negative 8/2 renal ultrasound >> no hydro, findings consistent with medicorenal disease  Micro Data:  8/1 SARS COV 2 > positive 8/1 blood -negative  8/5 blood >  8/5 resp >   Antimicrobials/COVID Rx:  8/1 ceftriaxone > 8/5-8/7 8/1 remdesivir -completed 8/5 8/1 tocilizumab   Interim history/subjective:     Objective   Blood pressure 106/64, pulse 96, temperature 99.7 F (37.6 C), resp. rate (!) 32, height 5\' 9"  (1.753 m), weight 120 kg, SpO2 100 %. CVP:  [3 mmHg-13 mmHg] 13 mmHg  Vent Mode: PRVC FiO2 (%):  [80 %] 80 % Set Rate:  [25 bmp-32 bmp] 32 bmp Vt Set:  [560 mL] 560 mL PEEP:  [14 cmH20] 14 cmH20 Plateau Pressure:  [31 cmH20-39 cmH20] 31 cmH20   Intake/Output Summary (Last 24 hours) at 07/31/2020 0854 Last data filed at 07/31/2020 0800 Gross per 24 hour  Intake 5515.85 ml  Output 8730 ml  Net -3214.15 ml   Filed Weights   07/29/20 0445 07/30/20 0500 07/31/20 0435  Weight: 120 kg 120 kg 120 kg     Examination: Blood pressure 106/64, pulse 96, temperature 99.7 F (37.6 C), resp. rate (!) 32, height 5\' 9"  (1.753 m), weight 120 kg, SpO2 100 %. Gen:      No acute distress HEENT:  EOMI, sclera anicteric Neck:     No masses; no thyromegaly Lungs:    Clear to auscultation bilaterally; normal respiratory effort CV:         Regular rate and rhythm; no murmurs Abd:      + bowel sounds; soft, non-tender; no palpable masses, no distension Ext:    No edema; adequate peripheral perfusion Skin:      Warm and dry; no rash Neuro: Sedated  Labs/imaging reviewed.  Significant for Sodium 132, BUN/creatinine 50/1.79, WBC 21.9, platelets 222 Chest x-ray 8/11-diffuse bilateral airspace disease with worsening   Resolved Hospital Problem list    Assessment & Plan:  ARDS due to COVID 19 pneumonia > worsening oxygenation on 8/6 Acute pulmonary edema due to acute renal failure Likely acute massive PE with acute cor pulmonale-> s/p systemic TPA on 8/6. Continue low tidal volume ventilation, paralytics Steroids Anticoagulation for presumed massive PE Recheck ABG and prone again if P/F ratio is less than 150  Severe shock midmorning 8/5, still persists Periodic bradycardia> no clear source, possibly due to hypothermia Levophed. Weaned off dopamine  Acute DVT R leg Continue anticoagulation  AKI with hyperkalemia BPH Metabolic acidosis-stable Continue CRRT  Hyperglycemia-better controlled today -Levemir  increased to 30 units twice daily -Increase tube feed coverage -Sliding scale insulin as needed -Continue monitoring  Need for sedation for mechanical ventilation -RASS target: -5 -Continue versed prn, fentanyl infusion per PAD protocol, propofol  Goals of care No significant improvement May be headed for tracheostomy.  We will need to update family.  Best practice:  Diet: tube feeds Pain/Anxiety/Delirium protocol (if indicated): On fentanyl, propofol VAP protocol (if  indicated): Per protocol DVT prophylaxis: heparin infusion GI prophylaxis: Protonix Glucose control: basal bolus insulin Mobility: bed rest Code Status: full Family Communication: brother Herbie [place] updated 8/11 Disposition: remain in ICU  Critical care time:   The patient is critically ill with multiple organ system failure and requires high complexity decision making for assessment and support, frequent evaluation and titration of therapies, advanced monitoring, review of radiographic studies and interpretation of complex data.   Critical Care Time devoted to patient care services, exclusive of separately billable procedures, described in this note is 35 minutes.   [name] MD [facility] Please see [email or web address] for pager details.  07/31/2020, 8:55 AM

## 2020-07-31 NOTE — Progress Notes (Signed)
ANTICOAGULATION CONSULT NOTE - Follow Up Consult  Pharmacy Consult for Heparin Indication: acute DVT, presumed PE  No Known Allergies  Patient Measurements: Height: 5\' 9"  (175.3 cm) Weight: 120 kg (264 lb 8.8 oz) IBW/kg (Calculated) : 70.7 Heparin Dosing Weight:   Vital Signs: Temp: 98.1 F (36.7 C) (08/12 0400) Temp Source: (P) Bladder (08/12 0100) Pulse Rate: 88 (08/12 0400)  Labs: Recent Labs    07/29/20 0355 07/29/20 1117 07/30/20 0400 07/30/20 0411 07/30/20 1225 07/30/20 1526 07/30/20 2010 07/30/20 2010 07/30/20 2014 07/31/20 0036 07/31/20 0154  HGB 10.7*   < > 10.5*   < >   < >  --  13.3   < > 12.2*  --  10.5*  HCT 34.4*   < > 34.0*   < >   < >  --  39.0  --  36.0*  --  34.0*  PLT 223  --  238  --   --   --   --   --   --   --  222  APTT 67*  --  45*  --   --   --   --   --   --   --   --   HEPARINUNFRC 0.68  --  0.24*  --   --  0.19*  --   --   --  0.45  --   CREATININE 2.45*   < > 2.18*   < >  --  1.94* 1.20  --  2.00*  --   --    < > = values in this interval not displayed.    Estimated Creatinine Clearance: 48.3 mL/min (A) (by C-G formula based on SCr of 2 mg/dL (H)).   Medications:  Infusions:  .  prismasol BGK 4/2.5 400 mL/hr at 07/30/20 2307  . sodium chloride 10 mL/hr at 07/31/20 0400  . calcium gluconate infusion for CRRT 20 g (07/30/20 2256)  . cisatracurium (NIMBEX) infusion 5 mcg/kg/min (07/31/20 0400)  . citrate dextrose 1,000 mL (07/30/20 1809)  . DOPamine Stopped (07/25/20 2244)  . fentaNYL infusion INTRAVENOUS 250 mcg/hr (07/31/20 0400)  . heparin 1,850 Units/hr (07/31/20 0400)  . norepinephrine (LEVOPHED) Adult infusion Stopped (07/30/20 1029)  . prismasol B22GK 4/0 2,200 mL/hr at 07/31/20 0113  . propofol (DIPRIVAN) infusion 50 mcg/kg/min (07/31/20 0415)  . vasopressin Stopped (07/24/20 2304)    Assessment: Patient with heparin level at goal.  No heparin issues noted.  Goal of Therapy:  Heparin level 0.3-0.7 units/ml Monitor  platelets by anticoagulation protocol: Yes   Plan:  Continue heparin drip at current rate Recheck level at 0900  Tyler Deis, Shea Stakes Crowford 07/31/2020,4:15 AM

## 2020-07-31 NOTE — Progress Notes (Signed)
Pt. placed back to supine position from being proned t/o shift, X staff present @ bedside, no complications.

## 2020-07-31 NOTE — Progress Notes (Signed)
ANTICOAGULATION CONSULT NOTE - Follow Up Consult  Pharmacy Consult for Heparin Indication: acute DVT, presumed PE  No Known Allergies  Patient Measurements: Height: 5\' 9"  (175.3 cm) Weight: 120 kg (264 lb 8.8 oz) IBW/kg (Calculated) : 70.7 Heparin Dosing Weight: 95 kg  Vital Signs: Temp: 99.5 F (37.5 C) (08/12 0945) Temp Source: Bladder (08/12 0800) Pulse Rate: 91 (08/12 0945)  Labs: Recent Labs    07/29/20 0355 07/29/20 1117 07/30/20 0400 07/30/20 0411 07/30/20 1526 07/30/20 2010 07/30/20 2010 07/30/20 2014 07/31/20 0036 07/31/20 0154 07/31/20 0822  HGB 10.7*   < > 10.5*   < >  --  13.3   < > 12.2*  --  10.5*  --   HCT 34.4*   < > 34.0*   < >  --  39.0  --  36.0*  --  34.0*  --   PLT 223  --  238  --   --   --   --   --   --  222  --   APTT 67*  --  45*  --   --   --   --   --   --  64*  --   HEPARINUNFRC 0.68  --  0.24*   < >   < >  --   --   --  0.45 0.44 0.63  CREATININE 2.45*   < > 2.18*   < >  --  1.20  --  2.00*  --  1.79*  --    < > = values in this interval not displayed.    Estimated Creatinine Clearance: 54 mL/min (A) (by C-G formula based on SCr of 1.79 mg/dL (H)).  Assessment: 64 yo male with COVID PNA, now on CRRT, D-dimer rose from 7 to 17 to start IV heparin per Rx dosing for acute R DVT peroneal vein.   Significant Events: -8/6: Pt had bradycardia, hypotension. Pt given alteplase 50 mg IV infusion over 2 hrs for PE @ 16:08 -8/6: Heparin infusion resumed @ 21:41 s/p TPA with aPTT <80  Today, 07/31/20  Heparin level therapeutic (0.63) on heparin 1850 units/hr  CBC: Hgb low 10.5, Plt WNL and stable  No issues with bleeding or problems with infusion per d/w RN.   Goal of Therapy:  HL 0.3 - 0.7 units/ml  Monitor platelets by anticoagulation protocol: Yes   Plan:   continue IV heparin at 1850 units/hr  Daily heparin level and CBC while on heparin  Monitor for signs/symptoms of bleeding  Dolly Rias RPh 07/31/2020, 10:30 AM

## 2020-07-31 NOTE — Progress Notes (Signed)
[facility] Progress Note  Subjective: -2.4 L net yest and - 1.8 L net neg today so far.  FiO2 0.8, CVP 3- 13. Levo at 12 ug/m. Sedated.   Vitals:   07/31/20 1015 07/31/20 1030 07/31/20 1045 07/31/20 1100  BP:      Pulse: 88 88 87 87  Resp: (!) 32 (!) 32 (!) 32 (!) 32  Temp: 99.5 F (37.5 C) 99.5 F (37.5 C) 99.5 F (37.5 C) 99.5 F (37.5 C)  TempSrc:      SpO2: 100% 100% 100% 100%  Weight:      Height:        Exam:  pt seen in ICU  on vent, sedated  coarse BS bilat  Cor reg no RG   abd soft no ascites or hsm   Ext mild edema of all ext   R IJ temp HD cath    No rash or livedo    CXR 8/8 > IMPRESSION: Appliances appear in satisfactory location. Cardiac enlargement with bilateral perihilar and basilar airspace infiltrates, likely edema.   Renal US 8/2 - 13-14 cm kidneys w/o hydro, +^'d echo seen w/ CKD   UA 8/2 - >300 prot, 20-50 rbc, 6-10 wbc, 0-5 epi   UP/C ratio = e.e   UNa 32, UCr 147  Assessment/ Plan: 1. Renal failure - no old data, hx of "kidney disease" per patient at time of admission. Creat 8.4 on admit 8/2, +proteinuria 3 gm and microhematuria on admit. Renal US normal size kidneys w/ ^'d echo seen w/ chronic disease.  CRRT started 8/3. HD cath malfunction resolved w/ repositioning 8/7, working well now. Good UF yest and today w/o much ^ in pressors. Cont citrate protocol. Cont UF 150- 200 / hr as tolerated. Keep CVP > 4.  2. Hyperkalemia - resolved 3. Metabolic acidosis - better, CO2 24, pH 7.2 w/ pCO2 58. Cont citrate dialysate (low bicarb, no Ca).  4. COVID PNA - w/ vent dependence, sp IV steroids/ remdesivir, on pred 50/d po now. FiO2 up as above.  5. Hypotension/ shock - stable, on levo gtt at 12  6. Anemia - Hb 10.7, mild decline 7. Suspected acute PE w/ cor pulmonale - sp systemic TPA on 8/6 and on getting full dose IV systemic heparin 8. Acute RLE DVT - as above 9. DM/ hyperglycemia - on SQ insulin per CCM     Rob  [place] 07/31/2020, 11:27 AM   Recent Labs  Lab 07/30/20 1526 07/30/20 2010 07/30/20 2014 07/31/20 0154  K 4.4   < > 4.8 4.3  BUN 49*   < > 49* 50*  CREATININE 1.94*   < > 2.00* 1.79*  CALCIUM 7.2*  --   --  7.2*  PHOS 5.6*  --   --  4.7*  HGB  --    < > 12.2* 10.5*   < > = values in this interval not displayed.   Inpatient medications: . artificial tears  1 application Both Eyes N8G  . vitamin C  500 mg Per Tube Daily  . chlorhexidine gluconate (MEDLINE KIT)  15 mL Mouth Rinse BID  . Chlorhexidine Gluconate Cloth  6 each Topical Daily  . docusate  100 mg Per Tube BID  . feeding supplement (PIVOT 1.5 CAL)  1,000 mL Per Tube Q24H  . feeding supplement (PROSource TF)  90 mL Per Tube 6 X Daily  . feeding supplement (PROSource TF)  90 mL Per Tube Daily  . insulin aspart  0-20  Units Subcutaneous Q4H  . insulin aspart  12 Units Subcutaneous Q4H  . insulin detemir  30 Units Subcutaneous BID  . mouth rinse  15 mL Mouth Rinse 10 times per day  . multivitamin with minerals  1 tablet Oral Daily  . pantoprazole (PROTONIX) IV  40 mg Intravenous Daily  . polyethylene glycol  17 g Per Tube Daily  . predniSONE  50 mg Per Tube Q breakfast  . sodium chloride flush  3 mL Intravenous Q12H  . zinc sulfate  220 mg Per Tube Daily   .  prismasol BGK 4/2.5 400 mL/hr at 07/30/20 2307  . sodium chloride 10 mL/hr at 07/31/20 1100  . calcium gluconate infusion for CRRT 20 g (07/30/20 2256)  . cisatracurium (NIMBEX) infusion 6 mcg/kg/min (07/31/20 1100)  . citrate dextrose 3,000 mL (07/31/20 1005)  . DOPamine Stopped (07/25/20 2244)  . fentaNYL infusion INTRAVENOUS 250 mcg/hr (07/31/20 1100)  . heparin 1,850 Units/hr (07/31/20 1100)  . norepinephrine (LEVOPHED) Adult infusion 12 mcg/min (07/31/20 1100)  . prismasol B22GK 4/0 2,200 mL/hr at 07/31/20 1009  . propofol (DIPRIVAN) infusion 50 mcg/kg/min (07/31/20 1100)  . vasopressin Stopped (07/24/20 2304)   sodium chloride, acetaminophen, alteplase,  EPINEPHrine, fentaNYL, heparin, heparin, midazolam, ondansetron **OR** ondansetron (ZOFRAN) IV, sennosides, sodium chloride, vecuronium

## 2020-08-01 DIAGNOSIS — U071 COVID-19: Secondary | ICD-10-CM | POA: Diagnosis not present

## 2020-08-01 DIAGNOSIS — J9601 Acute respiratory failure with hypoxia: Secondary | ICD-10-CM | POA: Diagnosis not present

## 2020-08-01 LAB — POCT I-STAT, CHEM 8
BUN: 35 mg/dL — ABNORMAL HIGH (ref 8–23)
BUN: 40 mg/dL — ABNORMAL HIGH (ref 8–23)
BUN: 37 mg/dL — ABNORMAL HIGH (ref 8–23)
BUN: 40 mg/dL — ABNORMAL HIGH (ref 8–23)
BUN: 43 mg/dL — ABNORMAL HIGH (ref 8–23)
BUN: 46 mg/dL — ABNORMAL HIGH (ref 8–23)
Calcium, Ion: 0.37 mmol/L — CL (ref 1.15–1.40)
Calcium, Ion: 0.9 mmol/L — ABNORMAL LOW (ref 1.15–1.40)
Calcium, Ion: 0.31 mmol/L — CL (ref 1.15–1.40)
Calcium, Ion: 0.51 mmol/L — CL (ref 1.15–1.40)
Calcium, Ion: 1 mmol/L — ABNORMAL LOW (ref 1.15–1.40)
Calcium, Ion: 1.02 mmol/L — ABNORMAL LOW (ref 1.15–1.40)
Chloride: 100 mmol/L (ref 98–111)
Chloride: 104 mmol/L (ref 98–111)
Chloride: 101 mmol/L (ref 98–111)
Chloride: 101 mmol/L (ref 98–111)
Chloride: 98 mmol/L (ref 98–111)
Chloride: 98 mmol/L (ref 98–111)
Creatinine, Ser: 1.1 mg/dL (ref 0.61–1.24)
Creatinine, Ser: 1.5 mg/dL — ABNORMAL HIGH (ref 0.61–1.24)
Creatinine, Ser: 1.3 mg/dL — ABNORMAL HIGH (ref 0.61–1.24)
Creatinine, Ser: 1.3 mg/dL — ABNORMAL HIGH (ref 0.61–1.24)
Creatinine, Ser: 1.7 mg/dL — ABNORMAL HIGH (ref 0.61–1.24)
Creatinine, Ser: 1.8 mg/dL — ABNORMAL HIGH (ref 0.61–1.24)
Glucose, Bld: 199 mg/dL — ABNORMAL HIGH (ref 70–99)
Glucose, Bld: 224 mg/dL — ABNORMAL HIGH (ref 70–99)
Glucose, Bld: 244 mg/dL — ABNORMAL HIGH (ref 70–99)
Glucose, Bld: 264 mg/dL — ABNORMAL HIGH (ref 70–99)
Glucose, Bld: 294 mg/dL — ABNORMAL HIGH (ref 70–99)
Glucose, Bld: 312 mg/dL — ABNORMAL HIGH (ref 70–99)
HCT: 32 % — ABNORMAL LOW (ref 39.0–52.0)
HCT: 41 % (ref 39.0–52.0)
HCT: 36 % — ABNORMAL LOW (ref 39.0–52.0)
HCT: 38 % — ABNORMAL LOW (ref 39.0–52.0)
HCT: 41 % (ref 39.0–52.0)
HCT: 43 % (ref 39.0–52.0)
Hemoglobin: 10.9 g/dL — ABNORMAL LOW (ref 13.0–17.0)
Hemoglobin: 13.9 g/dL (ref 13.0–17.0)
Hemoglobin: 12.2 g/dL — ABNORMAL LOW (ref 13.0–17.0)
Hemoglobin: 12.9 g/dL — ABNORMAL LOW (ref 13.0–17.0)
Hemoglobin: 13.9 g/dL (ref 13.0–17.0)
Hemoglobin: 14.6 g/dL (ref 13.0–17.0)
Potassium: 3.9 mmol/L (ref 3.5–5.1)
Potassium: 4 mmol/L (ref 3.5–5.1)
Potassium: 3.7 mmol/L (ref 3.5–5.1)
Potassium: 3.7 mmol/L (ref 3.5–5.1)
Potassium: 4.3 mmol/L (ref 3.5–5.1)
Potassium: 4.6 mmol/L (ref 3.5–5.1)
Sodium: 134 mmol/L — ABNORMAL LOW (ref 135–145)
Sodium: 135 mmol/L (ref 135–145)
Sodium: 132 mmol/L — ABNORMAL LOW (ref 135–145)
Sodium: 134 mmol/L — ABNORMAL LOW (ref 135–145)
Sodium: 134 mmol/L — ABNORMAL LOW (ref 135–145)
Sodium: 136 mmol/L (ref 135–145)
TCO2: 23 mmol/L (ref 22–32)
TCO2: 23 mmol/L (ref 22–32)
TCO2: 22 mmol/L (ref 22–32)
TCO2: 23 mmol/L (ref 22–32)
TCO2: 23 mmol/L (ref 22–32)
TCO2: 24 mmol/L (ref 22–32)

## 2020-08-01 LAB — RENAL FUNCTION PANEL
Albumin: 2.3 g/dL — ABNORMAL LOW (ref 3.5–5.0)
Albumin: 2.6 g/dL — ABNORMAL LOW (ref 3.5–5.0)
Anion gap: 16 — ABNORMAL HIGH (ref 5–15)
Anion gap: 17 — ABNORMAL HIGH (ref 5–15)
BUN: 46 mg/dL — ABNORMAL HIGH (ref 8–23)
BUN: 56 mg/dL — ABNORMAL HIGH (ref 8–23)
CO2: 21 mmol/L — ABNORMAL LOW (ref 22–32)
CO2: 21 mmol/L — ABNORMAL LOW (ref 22–32)
Calcium: 7.9 mg/dL — ABNORMAL LOW (ref 8.9–10.3)
Calcium: 8.5 mg/dL — ABNORMAL LOW (ref 8.9–10.3)
Chloride: 97 mmol/L — ABNORMAL LOW (ref 98–111)
Chloride: 95 mmol/L — ABNORMAL LOW (ref 98–111)
Creatinine, Ser: 1.73 mg/dL — ABNORMAL HIGH (ref 0.61–1.24)
Creatinine, Ser: 1.78 mg/dL — ABNORMAL HIGH (ref 0.61–1.24)
GFR calc Af Amer: 48 mL/min — ABNORMAL LOW (ref >60)
GFR calc Af Amer: 46 mL/min — ABNORMAL LOW (ref >60)
GFR calc non Af Amer: 41 mL/min — ABNORMAL LOW (ref >60)
GFR calc non Af Amer: 40 mL/min — ABNORMAL LOW (ref >60)
Glucose, Bld: 194 mg/dL — ABNORMAL HIGH (ref 70–99)
Glucose, Bld: 293 mg/dL — ABNORMAL HIGH (ref 70–99)
Phosphorus: 4 mg/dL (ref 2.5–4.6)
Phosphorus: 5.5 mg/dL — ABNORMAL HIGH (ref 2.5–4.6)
Potassium: 4.1 mmol/L (ref 3.5–5.1)
Potassium: 4.7 mmol/L (ref 3.5–5.1)
Sodium: 134 mmol/L — ABNORMAL LOW (ref 135–145)
Sodium: 133 mmol/L — ABNORMAL LOW (ref 135–145)

## 2020-08-01 LAB — BLOOD GAS, ARTERIAL
Acid-base deficit: 7.8 mmol/L — ABNORMAL HIGH (ref 0.0–2.0)
Bicarbonate: 19.8 mmol/L — ABNORMAL LOW (ref 20.0–28.0)
FIO2: 90
O2 Saturation: 98.2 %
Patient temperature: 97.7
pCO2 arterial: 49.7 mmHg — ABNORMAL HIGH (ref 32.0–48.0)
pH, Arterial: 7.221 — ABNORMAL LOW (ref 7.350–7.450)
pO2, Arterial: 144 mmHg — ABNORMAL HIGH (ref 83.0–108.0)

## 2020-08-01 LAB — HEPARIN LEVEL (UNFRACTIONATED)
Heparin Unfractionated: 0.86 IU/mL — ABNORMAL HIGH (ref 0.30–0.70)
Heparin Unfractionated: 0.46 IU/mL (ref 0.30–0.70)

## 2020-08-01 LAB — MAGNESIUM: Magnesium: 2.1 mg/dL (ref 1.7–2.4)

## 2020-08-01 LAB — GLUCOSE, CAPILLARY
Glucose-Capillary: 216 mg/dL — ABNORMAL HIGH (ref 70–99)
Glucose-Capillary: 152 mg/dL — ABNORMAL HIGH (ref 70–99)
Glucose-Capillary: 202 mg/dL — ABNORMAL HIGH (ref 70–99)
Glucose-Capillary: 211 mg/dL — ABNORMAL HIGH (ref 70–99)
Glucose-Capillary: 253 mg/dL — ABNORMAL HIGH (ref 70–99)
Glucose-Capillary: 287 mg/dL — ABNORMAL HIGH (ref 70–99)
Glucose-Capillary: 233 mg/dL — ABNORMAL HIGH (ref 70–99)
Glucose-Capillary: 236 mg/dL — ABNORMAL HIGH (ref 70–99)

## 2020-08-01 LAB — APTT: aPTT: 108 seconds — ABNORMAL HIGH (ref 24–36)

## 2020-08-01 LAB — CALCIUM, IONIZED: Calcium, Ionized, Serum: 3.4 mg/dL — ABNORMAL LOW (ref 4.5–5.6)

## 2020-08-01 MED ORDER — HEPARIN (PORCINE) 25000 UT/250ML-% IV SOLN
1850.0000 [IU]/h | INTRAVENOUS | Status: DC
  Administered 2020-08-01 – 2020-08-03 (×3): 1650 [IU]/h via INTRAVENOUS
  Administered 2020-08-03 – 2020-08-07 (×7): 1850 [IU]/h via INTRAVENOUS
  Filled 2020-08-01 (×8): qty 250

## 2020-08-01 MED ORDER — SODIUM CHLORIDE 0.9 % IV BOLUS: 555.0000 mL | Freq: Once | INTRAVENOUS | Status: DC

## 2020-08-01 NOTE — Progress Notes (Signed)
ANTICOAGULATION CONSULT NOTE - Follow Up Consult  Pharmacy Consult for Heparin Indication: acute DVT, presumed PE  No Known Allergies  Patient Measurements: Height: 5\' 9"  (175.3 cm) Weight: 120 kg (264 lb 8.8 oz) IBW/kg (Calculated) : 70.7 Heparin Dosing Weight: 95 kg  Vital Signs: Temp: 97.9 F (36.6 C) (08/13 0800) Temp Source: Bladder (08/13 0800) Pulse Rate: 74 (08/13 0808)  Labs: Recent Labs    07/30/20 0400 07/30/20 0411 07/31/20 0154 07/31/20 0356 07/31/20 0822 07/31/20 0843 07/31/20 1631 07/31/20 1631 08/01/20 0036 08/01/20 0049 08/01/20 0241 08/01/20 0251  HGB 10.5*   < > 10.5*   < > 12.2*   < > 14.3   < > 10.9* 13.9  --   --   HCT 34.0*   < > 34.0*   < > 36.0*   < > 42.0  --  32.0* 41.0  --   --   PLT 238  --  222  --   --   --   --   --   --   --   --   --   APTT 45*  --  64*  --   --   --   --   --   --   --   --  108*  HEPARINUNFRC 0.24*   < > 0.44  --  0.63  --   --   --   --   --  0.86*  --   CREATININE 2.18*   < > 1.79*   < > 1.90*   < > 1.10   < > 1.50* 1.10 1.73*  --    < > = values in this interval not displayed.    Estimated Creatinine Clearance: 55.9 mL/min (A) (by C-G formula based on SCr of 1.73 mg/dL (H)).  Assessment: 64 yo male with COVID PNA, now on CRRT, D-dimer rose from 7 to 17 to start IV heparin per Rx dosing for acute R DVT peroneal vein.   Significant Events: -8/6: Pt had bradycardia, hypotension. Pt given alteplase 50 mg IV infusion over 2 hrs for PE @ 16:08 -8/6: Heparin infusion resumed @ 21:41 s/p TPA with aPTT <80  Today, 08/01/20  Heparin level supra therapeutic (0.86) on heparin 1850 units/hr  CBC: Hgb improved 13.9, Plt WNL and stable  No issues with bleeding or problems with infusion per d/w RN.   Goal of Therapy:  HL 0.3 - 0.7 units/ml  Monitor platelets by anticoagulation protocol: Yes   Plan:   Decrease heparin to 1650 units/hr  Check heparin level in 8 hours  Daily heparin level and CBC while on  heparin  Monitor for signs/symptoms of bleeding  Dolly Rias RPh 08/01/2020, 8:22 AM

## 2020-08-01 NOTE — Progress Notes (Deleted)
eLink Physician-Brief Progress Note Patient Name: Alex [name] DOB: 1956-05-28 MRN: [id number]   Date of Service  08/01/2020  HPI/Events of Note  Lactic acid 3.8  eICU Interventions  Normal Saline fluid bolus 555 ml iv x 1        Harol Shabazz U Latoya Diskin 08/01/2020, 4:21 AM

## 2020-08-01 NOTE — Progress Notes (Signed)
RT Note: Unable to change pts. head position at this time due to central line positioning/CRRT line kinking off, oncoming shift to be made aware.

## 2020-08-01 NOTE — Progress Notes (Signed)
P/F ratio 160 in supine postion.  Will hold on proning   Discussed with brother Herbie [place] again regarding goals of care. He has confirmed DNR status.  DO NOT RESUSCITATE in case of cardiac arrest Continue all other measures We are planning on a family meeting on Monday at 1 PM when we have further discussion on possible withdrawal of care.  The patient is critically ill with multiple organ system failure and requires high complexity decision making for assessment and support, frequent evaluation and titration of therapies, advanced monitoring, review of radiographic studies and interpretation of complex data.   Critical Care Time devoted to patient care services, exclusive of separately billable procedures, described in this note is 35 minutes.   [name] MD [facility] Please see [email or web address] for pager details.  08/01/2020, 4:21 PM

## 2020-08-01 NOTE — Progress Notes (Signed)
[facility] Progress Note  Subjective: net neg 4.3 L yesterday and 2.4 L this am.  CVP down 5-9. Wt's same , today's is pending. Levo gtt up at 11u/m.   Vitals:   08/01/20 0600 08/01/20 0615 08/01/20 0630 08/01/20 0645  BP:      Pulse: 69 69 69 65  Resp: (!) 34 (!) 34 (!) 34 (!) 34  Temp: 98.8 F (37.1 C) 98.6 F (37 C) 98.6 F (37 C) 98.4 F (36.9 C)  TempSrc:      SpO2: 100% 100% 100% 99%  Weight:      Height:        Exam:  pt seen in ICU  on vent, sedated  coarse BS bilat  Cor reg no RG   abd soft no ascites or hsm   Ext mild edema of all ext   R IJ temp HD cath    No rash or livedo    CXR 8/8 > IMPRESSION: Appliances appear in satisfactory location. Cardiac enlargement with bilateral perihilar and basilar airspace infiltrates, likely edema.   CXR 8/12 - better overall w/ dec'd infiltrates   Renal US 8/2 - 13-14 cm kidneys w/o hydro, +^'d echo seen w/ CKD   UA 8/2 - >300 prot, 20-50 rbc, 6-10 wbc, 0-5 epi   UP/C ratio = e.e   UNa 32, UCr 147  Assessment/ Plan: 1. Renal failure - no old data, hx of "kidney disease" per patient at time of admission. Creat 8.4 on admit 8/2, +proteinuria 3 gm and microhematuria on admit. Renal US normal size kidneys w/ ^'d echo seen w/ chronic disease.  CRRT started 8/3. HD cath malfunction resolved w/ repositioning 8/7. Large UF yesterday. Wt's should be down but are staying at 120kg, will ask RN to check scale. CVP is down, will lower UF 50 cc/hr. Cont citrate protocol. Keep CVP > 4.  2. Hyperkalemia - resolved 3. Metabolic acidosis - better, CO2 24, pH 7.2 w/ pCO2 58. Cont citrate dialysate (low bicarb, no Ca).  4. COVID PNA - w/ vent dependence, sp IV steroids/ remdesivir, on pred 50/d po. FiO2 up to 100%.  5. Hypotension/ shock - stable, on levo gtt at 11 6. Anemia - Hb 10.7, mild decline 7. Suspected acute PE w/ cor pulmonale - sp systemic TPA on 8/6 and on getting full dose IV systemic heparin 8. Acute RLE DVT - as  above 9. DM/ hyperglycemia - on SQ insulin per CCM     Rob Ohiopyle 08/01/2020, 7:36 AM   Recent Labs  Lab 07/31/20 1524 07/31/20 1626 08/01/20 0036 08/01/20 0036 08/01/20 0049 08/01/20 0241  K 4.6   < > 3.9   < > 4.0 4.1  BUN 52*   < > 40*   < > 35* 46*  CREATININE 1.89*   < > 1.50*   < > 1.10 1.73*  CALCIUM 7.3*  --   --   --   --  7.9*  PHOS 6.1*  --   --   --   --  4.0  HGB  --    < > 10.9*  --  13.9  --    < > = values in this interval not displayed.   Inpatient medications: . artificial tears  1 application Both Eyes H1T  . vitamin C  500 mg Per Tube Daily  . chlorhexidine gluconate (MEDLINE KIT)  15 mL Mouth Rinse BID  . Chlorhexidine Gluconate Cloth  6 each Topical Daily  . docusate  100 mg Per Tube BID  . feeding supplement (PIVOT 1.5 CAL)  1,000 mL Per Tube Q24H  . feeding supplement (PROSource TF)  90 mL Per Tube 6 X Daily  . feeding supplement (PROSource TF)  90 mL Per Tube Daily  . insulin aspart  0-20 Units Subcutaneous Q4H  . insulin aspart  12 Units Subcutaneous Q4H  . insulin detemir  30 Units Subcutaneous BID  . mouth rinse  15 mL Mouth Rinse 10 times per day  . multivitamin with minerals  1 tablet Oral Daily  . pantoprazole (PROTONIX) IV  40 mg Intravenous Daily  . polyethylene glycol  17 g Per Tube Daily  . predniSONE  50 mg Per Tube Q breakfast  . sodium chloride flush  3 mL Intravenous Q12H  . zinc sulfate  220 mg Per Tube Daily   .  prismasol BGK 4/2.5 400 mL/hr at 08/01/20 0105  . sodium chloride 10 mL/hr at 08/01/20 0400  . amiodarone Stopped (07/31/20 2150)  . calcium gluconate infusion for CRRT 70 mL/hr at 08/01/20 0035  . cisatracurium (NIMBEX) infusion 6.5 mcg/kg/min (08/01/20 7793)  . citrate dextrose 1,000 mL (08/01/20 0547)  . DOPamine Stopped (07/25/20 2244)  . fentaNYL infusion INTRAVENOUS 250 mcg/hr (08/01/20 0400)  . heparin    . norepinephrine (LEVOPHED) Adult infusion 11 mcg/min (08/01/20 0640)  . prismasol B22GK 4/0 2,200  mL/hr at 08/01/20 0658  . propofol (DIPRIVAN) infusion 50 mcg/kg/min (08/01/20 0656)  . vasopressin Stopped (07/24/20 2304)   sodium chloride, acetaminophen, alteplase, EPINEPHrine, fentaNYL, heparin, heparin, midazolam, ondansetron **OR** ondansetron (ZOFRAN) IV, sennosides, sodium chloride, vecuronium

## 2020-08-01 NOTE — Progress Notes (Addendum)
NAME:  Alex [name], MRN:  [id number], DOB:  April 16, 1956, LOS: 12 ADMISSION DATE:  07/24/2020, CONSULTATION DATE:  8/1 REFERRING MD:  Sedonia Small, CHIEF COMPLAINT:  Dyspnea   Brief History   64 y/o male with acute hypoxemic respiratory failure due to COVID 19 pneumonia admitted on 8/1  Past Medical History  Cigarette smoker DM2 Hypertension CKD?  Significant Hospital Events   8/1 admission 8/2 intubation 8/3 started CRRT 8/5 significant improvement in oxygenation; mid morning developed severe sudden shock of uncertain etiology, low grade temp 8/6 Given systemic TPA due to repeated episodes of shock and cardiac instability and concern for PE 8/11 still significantly hypoxemic, requiring intermittent paralytics.  Resume proning  Consults:  Nephrology  Procedures:  8/2 ETT >  8/2 L IJ CVL >  8/3 R IJ HD cath >   Significant Diagnostic Tests:  8/2 lower ext doppler/vascular ultrasound >> acute R DVT peroneal vein, left negative 8/2 renal ultrasound >> no hydro, findings consistent with medicorenal disease  Micro Data:  8/1 SARS COV 2 > positive 8/1 blood -negative  8/5 blood >  8/5 resp >   Antimicrobials/COVID Rx:  8/1 ceftriaxone > 8/5-8/7 8/1 remdesivir -completed 8/5 8/1 tocilizumab   Interim history/subjective:   Continues on CVVH, proning.  Objective   Blood pressure 106/64, pulse 83, temperature 97.7 F (36.5 C), resp. rate (!) 34, height 5\' 9"  (1.753 m), weight 94.7 kg, SpO2 99 %. CVP:  [4 mmHg-19 mmHg] 6 mmHg  Vent Mode: PRVC FiO2 (%):  [80 %-100 %] 90 % Set Rate:  [32 bmp-34 bmp] 34 bmp Vt Set:  [560 mL] 560 mL PEEP:  [14 cmH20] 14 cmH20 Plateau Pressure:  [32 cmH20-34 cmH20] 34 cmH20   Intake/Output Summary (Last 24 hours) at 08/01/2020 1131 Last data filed at 08/01/2020 1000 Gross per 24 hour  Intake 5116.23 ml  Output 10548 ml  Net -5431.77 ml   Filed Weights   07/30/20 0500 07/31/20 0435 08/01/20 0800  Weight: 120 kg 120 kg 94.7 kg     Examination: Blood pressure 106/64, pulse 83, temperature 97.7 F (36.5 C), resp. rate (!) 34, height 5\' 9"  (1.753 m), weight 94.7 kg, SpO2 99 %. Gen:      No acute distress HEENT:  EOMI, sclera anicteric Neck:     No masses; no thyromegaly, ETT Lungs:    Clear to auscultation bilaterally; normal respiratory effort CV:         Regular rate and rhythm; no murmurs Abd:      + bowel sounds; soft, non-tender; no palpable masses, no distension Ext:    No edema; adequate peripheral perfusion Skin:      Warm and dry; no rash Neuro: Sedated, unresponsive  Labs/imaging reviewed.  Significant for Sodium 134, BUN/creatinine 46/1.73 No new imaging  Resolved Hospital Problem list    Assessment & Plan:  ARDS due to COVID 19 pneumonia > worsening oxygenation on 8/6 Acute pulmonary edema due to acute renal failure Likely acute massive PE with acute cor pulmonale-> s/p systemic TPA on 8/6. Continue low tidal volume ventilation, paralytics Steroids Anticoagulation for presumed massive PE Continue proning as long as P/F ratio is less than 150  Severe shock midmorning 8/5, still persists Periodic bradycardia> no clear source, possibly due to hypothermia Wean off pressors as tolerated  Acute DVT R leg Continue anticoagulation  AKI with hyperkalemia BPH Metabolic acidosis-stable Continue CRRT  Hyperglycemia-better controlled today Levemir, tube feed coverage, SSI  Need for sedation for mechanical ventilation -RASS  target: -5 -Continue versed prn, fentanyl infusion per PAD protocol, propofol  Goals of care No significant improvement Discussed with brother Alex [name]. He is having a hard time dealing with the situation. He recently lost a sibling Reviewed DNR status but the family is ready to make the decision yet. Alex [name] is requesting daily update form a doctor.  Consult palliative care.  Best practice:  Diet: tube feeds Pain/Anxiety/Delirium protocol (if indicated): On fentanyl,  propofol VAP protocol (if indicated): Per protocol DVT prophylaxis: heparin infusion GI prophylaxis: Protonix Glucose control: basal bolus insulin Mobility: bed rest Code Status: full Family Communication: brother Alex [name] updated 8/13 Disposition: remain in ICU  Critical care time:   The patient is critically ill with multiple organ system failure and requires high complexity decision making for assessment and support, frequent evaluation and titration of therapies, advanced monitoring, review of radiographic studies and interpretation of complex data.   Critical Care Time devoted to patient care services, exclusive of separately billable procedures, described in this note is 50 minutes.   [name] MD [facility] Please see [email or web address] for pager details.  08/01/2020, 11:31 AM

## 2020-08-01 NOTE — Progress Notes (Signed)
Called brother Herbie [place] back as he called and left a message per Network engineer to change family meeting time.  Now requesting family meeting on Monday August 16th at 4:30 PM, initial meeting at [facility] would not work for family. Brother mentioned multiple times during the call that "he was unable to have his mother SIGN OFF on the DNR status that was discussed today and "want everything done." Brother was very adamant regarding code status to be full.    Will make patient a FULL CODE at this time until further family discussion.  MD Mannam notified of discussion.

## 2020-08-01 NOTE — TOC Progression Note (Signed)
Transition of Care [facility]) - Progression Note    Patient Details  Name: Alex [name] MRN: [id number] Date of Birth: 07-20-1956  Transition of [facility]) CM/SW Contact  Leeroy Cha, RN Phone Number: 08/01/2020, 8:20 AM  Clinical Narrative:    Remains on the vent at [id number], a.fib and episodes of bradycardia, Amiodarone on hold but hanging..  proning being done, remains on crrt for the kidney failure.  Following for progression patient may need ltach once stable.  Expected Discharge Plan: Home/Self Care Barriers to Discharge: Continued Medical Work up  Expected Discharge Plan and Services Expected Discharge Plan: Home/Self Care       Living arrangements for the past 2 months: Single Family Home                                       Social Determinants of Health (SDOH) Interventions    Readmission Risk Interventions No flowsheet data found.

## 2020-08-01 NOTE — Progress Notes (Signed)
eLink Physician-Brief Progress Note Patient Name: [name] DOB: [birth year] MRN: [id number]   Date of Service  08/01/2020  HPI/Events of Note  Ionized calcium has normalized.  eICU Interventions  Okay to revert to Q 8 hourly ionized calcium check.        Kerry Kass Zaden Sako 08/01/2020, 5:09 AM

## 2020-08-01 NOTE — Progress Notes (Signed)
ANTICOAGULATION CONSULT NOTE - Follow Up Consult  Pharmacy Consult for Heparin Indication: acute DVT, presumed PE  No Known Allergies  Patient Measurements: Height: 5\' 9"  (175.3 cm) Weight: 94.7 kg (208 lb 12.4 oz) IBW/kg (Calculated) : 70.7 Heparin Dosing Weight: 90.3 kg  Vital Signs: Temp: 96.3 F (35.7 C) (08/13 1800) Temp Source: Bladder (08/13 0800) Pulse Rate: 73 (08/13 1800)  Labs: Recent Labs    07/30/20 0400 07/30/20 0411 07/31/20 0154 07/31/20 0356 07/31/20 0822 07/31/20 0843 08/01/20 0241 08/01/20 0251 08/01/20 0944 08/01/20 0957 08/01/20 0957 08/01/20 1627 08/01/20 1640 08/01/20 1647  HGB 10.5*   < > 10.5*   < > 12.2*   < >  --   --    < > 12.9*   < >  --  12.2* 13.9  HCT 34.0*   < > 34.0*   < > 36.0*   < >  --   --    < > 38.0*  --   --  36.0* 41.0  PLT 238  --  222  --   --   --   --   --   --   --   --   --   --   --   APTT 45*  --  64*  --   --   --   --  108*  --   --   --   --   --   --   HEPARINUNFRC 0.24*   < > 0.44   < > 0.63  --  0.86*  --   --   --   --  0.46  --   --   CREATININE 2.18*   < > 1.79*   < > 1.90*   < > 1.73*  --    < > 1.70*   < > 1.78* 1.80* 1.30*   < > = values in this interval not displayed.    Estimated Creatinine Clearance: 66.1 mL/min (A) (by C-G formula based on SCr of 1.3 mg/dL (H)).  Assessment: 64 yo male with COVID PNA, now on CRRT, D-dimer rose from 7 to 17. Pharmacy consulted for IV heparin dosing for acute R DVT peroneal vein.   Significant Events: -8/6: Pt had bradycardia, hypotension. Pt given alteplase 50 mg IV infusion over 2 hrs @ 16:08 due to concern for PE -8/6: Heparin infusion resumed @ 21:41 s/p TPA with aPTT <80  Today, 08/01/20  PM heparin level = 0.46 units/mL, now therapeutic   CBC: H/H WNL. Pltc WNL (on 07/31/20).   No infusion or bleeding issues noted per nursing.   Goal of Therapy:  Heparin level 0.3 - 0.7 units/ml Monitor platelets by anticoagulation protocol: Yes   Plan:   Continue  heparin infusion at 1650 units/hr  Check heparin level in 8 hours  Daily heparin level and CBC while on heparin  Monitor for signs/symptoms of bleeding   Lindell Spar, PharmD, BCPS Clinical Pharmacist  08/01/2020, 7:31 PM

## 2020-08-01 NOTE — Progress Notes (Signed)
Pt unproned at 1155. RT will continue to monitor. ABG to be drawn in 2 hours.

## 2020-08-02 ENCOUNTER — Inpatient Hospital Stay (HOSPITAL_COMMUNITY): Payer: Medicaid Other

## 2020-08-02 DIAGNOSIS — J9601 Acute respiratory failure with hypoxia: Secondary | ICD-10-CM | POA: Diagnosis not present

## 2020-08-02 DIAGNOSIS — N179 Acute kidney failure, unspecified: Secondary | ICD-10-CM | POA: Diagnosis not present

## 2020-08-02 DIAGNOSIS — R579 Shock, unspecified: Secondary | ICD-10-CM | POA: Diagnosis present

## 2020-08-02 DIAGNOSIS — U071 COVID-19: Secondary | ICD-10-CM | POA: Diagnosis not present

## 2020-08-02 LAB — POCT I-STAT, CHEM 8
BUN: 40 mg/dL — ABNORMAL HIGH (ref 8–23)
BUN: 47 mg/dL — ABNORMAL HIGH (ref 8–23)
Calcium, Ion: 0.37 mmol/L — CL (ref 1.15–1.40)
Calcium, Ion: 1.04 mmol/L — ABNORMAL LOW (ref 1.15–1.40)
Chloride: 101 mmol/L (ref 98–111)
Chloride: 98 mmol/L (ref 98–111)
Creatinine, Ser: 1.3 mg/dL — ABNORMAL HIGH (ref 0.61–1.24)
Creatinine, Ser: 1.8 mg/dL — ABNORMAL HIGH (ref 0.61–1.24)
Glucose, Bld: 219 mg/dL — ABNORMAL HIGH (ref 70–99)
Glucose, Bld: 241 mg/dL — ABNORMAL HIGH (ref 70–99)
HCT: 37 % — ABNORMAL LOW (ref 39.0–52.0)
HCT: 41 % (ref 39.0–52.0)
Hemoglobin: 12.6 g/dL — ABNORMAL LOW (ref 13.0–17.0)
Hemoglobin: 13.9 g/dL (ref 13.0–17.0)
Potassium: 4.1 mmol/L (ref 3.5–5.1)
Potassium: 4.2 mmol/L (ref 3.5–5.1)
Sodium: 133 mmol/L — ABNORMAL LOW (ref 135–145)
Sodium: 136 mmol/L (ref 135–145)
TCO2: 24 mmol/L (ref 22–32)
TCO2: 24 mmol/L (ref 22–32)

## 2020-08-02 LAB — PHOSPHORUS: Phosphorus: 4.6 mg/dL (ref 2.5–4.6)

## 2020-08-02 LAB — BASIC METABOLIC PANEL
Anion gap: 16 — ABNORMAL HIGH (ref 5–15)
BUN: 54 mg/dL — ABNORMAL HIGH (ref 8–23)
CO2: 20 mmol/L — ABNORMAL LOW (ref 22–32)
Calcium: 8.8 mg/dL — ABNORMAL LOW (ref 8.9–10.3)
Chloride: 93 mmol/L — ABNORMAL LOW (ref 98–111)
Creatinine, Ser: 1.83 mg/dL — ABNORMAL HIGH (ref 0.61–1.24)
GFR calc Af Amer: 45 mL/min — ABNORMAL LOW (ref >60)
GFR calc non Af Amer: 38 mL/min — ABNORMAL LOW (ref >60)
Glucose, Bld: 159 mg/dL — ABNORMAL HIGH (ref 70–99)
Potassium: 3.8 mmol/L (ref 3.5–5.1)
Sodium: 129 mmol/L — ABNORMAL LOW (ref 135–145)

## 2020-08-02 LAB — GLUCOSE, CAPILLARY
Glucose-Capillary: 133 mg/dL — ABNORMAL HIGH (ref 70–99)
Glucose-Capillary: 142 mg/dL — ABNORMAL HIGH (ref 70–99)
Glucose-Capillary: 155 mg/dL — ABNORMAL HIGH (ref 70–99)
Glucose-Capillary: 162 mg/dL — ABNORMAL HIGH (ref 70–99)
Glucose-Capillary: 167 mg/dL — ABNORMAL HIGH (ref 70–99)
Glucose-Capillary: 174 mg/dL — ABNORMAL HIGH (ref 70–99)
Glucose-Capillary: 188 mg/dL — ABNORMAL HIGH (ref 70–99)

## 2020-08-02 LAB — BLOOD GAS, ARTERIAL
Acid-base deficit: 4.7 mmol/L — ABNORMAL HIGH (ref 0.0–2.0)
Bicarbonate: 21.8 mmol/L (ref 20.0–28.0)
Drawn by: 11249
FIO2: 90
MECHVT: 560 mL
O2 Saturation: 98.5 %
PEEP: 14 cmH2O
Patient temperature: 36.9
RATE: 34 resp/min
pCO2 arterial: 48.8 mmHg — ABNORMAL HIGH (ref 32.0–48.0)
pH, Arterial: 7.272 — ABNORMAL LOW (ref 7.350–7.450)
pO2, Arterial: 164 mmHg — ABNORMAL HIGH (ref 83.0–108.0)

## 2020-08-02 LAB — RENAL FUNCTION PANEL
Albumin: 2.4 g/dL — ABNORMAL LOW (ref 3.5–5.0)
Albumin: 2.3 g/dL — ABNORMAL LOW (ref 3.5–5.0)
Anion gap: 20 — ABNORMAL HIGH (ref 5–15)
Anion gap: 14 (ref 5–15)
BUN: 54 mg/dL — ABNORMAL HIGH (ref 8–23)
BUN: 64 mg/dL — ABNORMAL HIGH (ref 8–23)
CO2: 21 mmol/L — ABNORMAL LOW (ref 22–32)
CO2: 22 mmol/L (ref 22–32)
Calcium: 9.1 mg/dL (ref 8.9–10.3)
Calcium: 8.4 mg/dL — ABNORMAL LOW (ref 8.9–10.3)
Chloride: 96 mmol/L — ABNORMAL LOW (ref 98–111)
Chloride: 100 mmol/L (ref 98–111)
Creatinine, Ser: 1.97 mg/dL — ABNORMAL HIGH (ref 0.61–1.24)
Creatinine, Ser: 1.96 mg/dL — ABNORMAL HIGH (ref 0.61–1.24)
GFR calc Af Amer: 41 mL/min — ABNORMAL LOW (ref >60)
GFR calc Af Amer: 41 mL/min — ABNORMAL LOW (ref >60)
GFR calc non Af Amer: 35 mL/min — ABNORMAL LOW (ref >60)
GFR calc non Af Amer: 35 mL/min — ABNORMAL LOW (ref >60)
Glucose, Bld: 157 mg/dL — ABNORMAL HIGH (ref 70–99)
Glucose, Bld: 179 mg/dL — ABNORMAL HIGH (ref 70–99)
Phosphorus: 4.8 mg/dL — ABNORMAL HIGH (ref 2.5–4.6)
Phosphorus: 4.7 mg/dL — ABNORMAL HIGH (ref 2.5–4.6)
Potassium: 4 mmol/L (ref 3.5–5.1)
Potassium: 4.6 mmol/L (ref 3.5–5.1)
Sodium: 137 mmol/L (ref 135–145)
Sodium: 136 mmol/L (ref 135–145)

## 2020-08-02 LAB — CBC
HCT: 34.3 % — ABNORMAL LOW (ref 39.0–52.0)
HCT: 30.8 % — ABNORMAL LOW (ref 39.0–52.0)
Hemoglobin: 10.7 g/dL — ABNORMAL LOW (ref 13.0–17.0)
Hemoglobin: 9.4 g/dL — ABNORMAL LOW (ref 13.0–17.0)
MCH: 28.5 pg (ref 26.0–34.0)
MCH: 28.1 pg (ref 26.0–34.0)
MCHC: 31.2 g/dL (ref 30.0–36.0)
MCHC: 30.5 g/dL (ref 30.0–36.0)
MCV: 91.5 fL (ref 80.0–100.0)
MCV: 91.9 fL (ref 80.0–100.0)
Platelets: 207 10*3/uL (ref 150–400)
Platelets: 178 10*3/uL (ref 150–400)
RBC: 3.75 MIL/uL — ABNORMAL LOW (ref 4.22–5.81)
RBC: 3.35 MIL/uL — ABNORMAL LOW (ref 4.22–5.81)
RDW: 17 % — ABNORMAL HIGH (ref 11.5–15.5)
RDW: 16.8 % — ABNORMAL HIGH (ref 11.5–15.5)
WBC: 22 10*3/uL — ABNORMAL HIGH (ref 4.0–10.5)
WBC: 19.7 10*3/uL — ABNORMAL HIGH (ref 4.0–10.5)
nRBC: 2 % — ABNORMAL HIGH (ref 0.0–0.2)
nRBC: 2.4 % — ABNORMAL HIGH (ref 0.0–0.2)

## 2020-08-02 LAB — APTT: aPTT: 66 seconds — ABNORMAL HIGH (ref 24–36)

## 2020-08-02 LAB — HEPARIN LEVEL (UNFRACTIONATED): Heparin Unfractionated: 0.47 IU/mL (ref 0.30–0.70)

## 2020-08-02 LAB — MAGNESIUM: Magnesium: 2.2 mg/dL (ref 1.7–2.4)

## 2020-08-02 LAB — TRIGLYCERIDES: Triglycerides: 241 mg/dL — ABNORMAL HIGH (ref <150)

## 2020-08-02 MED ORDER — SODIUM CHLORIDE 0.9 % IV BOLUS
1000.0000 mL | Freq: Once | INTRAVENOUS | Status: AC
  Administered 2020-08-02: 1000 mL via INTRAVENOUS

## 2020-08-02 MED ORDER — PRISMASOL BGK 4/2.5 32-4-2.5 MEQ/L REPLACEMENT SOLN: Status: DC

## 2020-08-02 MED ORDER — PRISMASOL BGK 4/2.5 32-4-2.5 MEQ/L IV SOLN: INTRAVENOUS | Status: DC

## 2020-08-02 NOTE — Progress Notes (Signed)
NAME:  Alex [name], MRN:  [id number], DOB:  15-Aug-1956, LOS: 57 ADMISSION DATE:  08/01/2020, CONSULTATION DATE:  8/1 REFERRING MD:  Sedonia Small, CHIEF COMPLAINT:  Dyspnea   Brief History   64 y/o male with acute hypoxemic respiratory failure due to COVID 19 pneumonia admitted on 8/1  Past Medical History  Cigarette smoker DM2 Hypertension CKD?  Significant Hospital Events   8/1 admission 8/2 intubation 8/3 started CRRT 8/5 significant improvement in oxygenation; mid morning developed severe sudden shock of uncertain etiology, low grade temp 8/6 Given systemic TPA due to repeated episodes of shock and cardiac instability and concern for PE 8/11 still significantly hypoxemic, requiring intermittent paralytics.  Resume proning  Consults:  Nephrology  Procedures:  8/2 ETT >  8/2 L IJ CVL >  8/3 R IJ HD cath >   Significant Diagnostic Tests:  8/2 lower ext doppler/vascular ultrasound >> acute R DVT peroneal vein, left negative 8/2 renal ultrasound >> no hydro, findings consistent with medicorenal disease  Micro Data:  8/1 SARS COV 2 > positive 8/1 blood -negative 8/1  MRSA  PCR  8/1  8/5 blood cultures x 2 > neg     Antimicrobials/COVID Rx:  8/1 ceftriaxone > 8/5-8/7 8/1 remdesivir -completed 8/5 8/1 tocilizumab    Scheduled Meds: . artificial tears  1 application Both Eyes I7T  . vitamin C  500 mg Per Tube Daily  . chlorhexidine gluconate (MEDLINE KIT)  15 mL Mouth Rinse BID  . Chlorhexidine Gluconate Cloth  6 each Topical Daily  . docusate  100 mg Per Tube BID  . feeding supplement (PIVOT 1.5 CAL)  1,000 mL Per Tube Q24H  . feeding supplement (PROSource TF)  90 mL Per Tube 6 X Daily  . feeding supplement (PROSource TF)  90 mL Per Tube Daily  . insulin aspart  0-20 Units Subcutaneous Q4H  . insulin aspart  12 Units Subcutaneous Q4H  . insulin detemir  30 Units Subcutaneous BID  . mouth rinse  15 mL Mouth Rinse 10 times per day  . multivitamin with minerals  1 tablet  Oral Daily  . pantoprazole (PROTONIX) IV  40 mg Intravenous Daily  . polyethylene glycol  17 g Per Tube Daily  . predniSONE  50 mg Per Tube Q breakfast  . sodium chloride flush  3 mL Intravenous Q12H  . zinc sulfate  220 mg Per Tube Daily   Continuous Infusions: .  prismasol BGK 4/2.5 400 mL/hr at 08/02/20 0259  . sodium chloride Stopped (08/01/20 1620)  . amiodarone Stopped (07/31/20 2150)  . calcium gluconate infusion for CRRT 20 g (08/02/20 0046)  . cisatracurium (NIMBEX) infusion 6 mcg/kg/min (08/02/20 0700)  . citrate dextrose 1,000 mL (08/02/20 0508)  . DOPamine Stopped (07/25/20 2244)  . fentaNYL infusion INTRAVENOUS 250 mcg/hr (08/02/20 0700)  . heparin 1,650 Units/hr (08/02/20 0700)  . norepinephrine (LEVOPHED) Adult infusion 10 mcg/min (08/02/20 0700)  . prismasol B22GK 4/0 1,800 mL/hr at 08/02/20 0511  . propofol (DIPRIVAN) infusion 50 mcg/kg/min (08/02/20 0700)  . vasopressin Stopped (07/24/20 2304)   PRN Meds:.sodium chloride, acetaminophen, alteplase, EPINEPHrine, fentaNYL, heparin, heparin, midazolam, ondansetron **OR** ondansetron (ZOFRAN) IV, sennosides, sodium chloride, vecuronium   Interim history/subjective:      Objective   Blood pressure 106/64, pulse 74, temperature (!) 97.5 F (36.4 C), resp. rate (!) 34, height 5' 9"  (1.753 m), weight 94.6 kg, SpO2 100 %. CVP:  [1 mmHg-6 mmHg] 3 mmHg  Vent Mode: PRVC FiO2 (%):  [90 %] 90 %  Set Rate:  [34 bmp] 34 bmp Vt Set:  [560 mL] 560 mL PEEP:  [14 cmH20] 14 cmH20 Plateau Pressure:  [34 cmH20-43 cmH20] 35 cmH20   Intake/Output Summary (Last 24 hours) at 08/02/2020 0738 Last data filed at 08/02/2020 0700 Gross per 24 hour  Intake 6106.99 ml  Output 6759 ml  Net -652.01 ml   Filed Weights   07/31/20 0435 08/01/20 0800 08/02/20 0500  Weight: 120 kg 94.7 kg 94.6 kg   CVP:  [1 mmHg-6 mmHg] 3 mmHg   Examination: Tmax 98.4  Pt sedated  Oropharynx oral ET  Neck supple Lungs with min  rhonchi bilaterally RRR  no s3 or or sign murmur Abd soft with limited excursion  Extr cool with no edema or clubbing noted      I personally reviewed images and agree with radiology impression as follows:  CXR:   Portable 08/02/20 Persistent patchy opacities are noted in both lungs predominately in the bases. No new focal abnormality is seen.   Resolved Hospital Problem list    Assessment & Plan:  ARDS due to COVID 19 pneumonia > worsening oxygenation on 8/6 Acute pulmonary edema due to acute renal failure Likely acute massive PE with acute cor pulmonale-> s/p systemic TPA on 8/6. Continue low tidal volume ventilation, paralytics Steroids Anticoagulation for presumed massive PE Gas exchange improved bit still on 14 peep/ 90% 02  >>>> rec   adjust down both as tol based on ards protocol   Severe shock   Periodic bradycardia> no clear source, possibly due to hypothermia - hypovolemic am 8/14 and still on levophed  >>>   will stop pulling neg/ given one NS bolus     Acute DVT R leg Continue anticoagulation  AKI with hyperkalemia BPH Metabolic acidosis-stable Continue CRRT but stop pulling negative since on pressors and gas exchange better > aim for cvp at least 10 if still on levophed   Hyperglycemia-better controlled today Levemir, tube feed coverage, SSI  Need for sedation for mechanical ventilation -RASS target: -5 -Continue versed prn, fentanyl infusion per PAD protocol, propofol  Goals of care Palliative care consulted  We may well be approaching medical futility here in terms of likelihood of restoring meaningful quality of life > certainly that would be the case if he codes and would not do prolonged resuscitation in that setting     Best practice:  Diet: tube feeds Pain/Anxiety/Delirium protocol (if indicated): On fentanyl, propofol VAP protocol (if indicated): Per protocol DVT prophylaxis: heparin infusion GI prophylaxis: Protonix Glucose control: basal bolus insulin Mobility:  bed rest Code Status: full Family Communication: brother Herbie [place] updated 8/13 - fm want full code for now  Disposition: remain in ICU   The patient is critically ill with multiple organ systems failure and requires high complexity decision making for assessment and support, frequent evaluation and titration of therapies, application of advanced monitoring technologies and extensive interpretation of multiple databases. Critical Care Time devoted to patient care services described in this note is 35 minutes.     Christinia Gully, MD Pulmonary and [place] [phone]   After 7:00 pm call Elink  [phone]

## 2020-08-02 NOTE — Progress Notes (Signed)
[facility] Progress Note  Subjective: net neg 3 L yest. Hypovolemic this am. Otherwise not doing well overall.   Vitals:   08/02/20 1000 08/02/20 1100 08/02/20 1102 08/02/20 1200  BP:  97/62  (!) 105/57  Pulse: 83 84    Resp: (!) 33 (!) 34  (!) 34  Temp: (!) 97.3 F (36.3 C) (!) 97 F (36.1 C)  (!) 97.2 F (36.2 C)  TempSrc:      SpO2: 100% 100% (!) 88%   Weight:      Height:        Exam:   Patient not examined today directly given COVID-19 + status, utilizing data taken from chart +/- discussions w/ providers and staff.      CXR 8/8 > IMPRESSION: Appliances appear in satisfactory location. Cardiac enlargement with bilateral perihilar and basilar airspace infiltrates, likely edema.   CXR 8/12 - better overall w/ dec'd infiltrates   Renal US 8/2 - 13-14 cm kidneys w/o hydro, +^'d echo seen w/ CKD   UA 8/2 - >300 prot, 20-50 rbc, 6-10 wbc, 0-5 epi   UP/C ratio = 3.3    UNa 32, UCr 147  Assessment/ Plan: 1. Renal failure - no old data, hx of "kidney disease" per patient at time of admission. Creat 8.4 on admit 8/2, +proteinuria 3 gm and microhematuria on admit. Renal US normal size kidneys w/ ^'d echo seen w/ chronic disease.  CRRT started 8/3. HD cath malfunction resolved w/ repositioning 8/7. Large UF yesterday again and hypovolemic this am w/ low CVP's. Overall not doing well per CCM notes. Cont CRRT, keep + 50 cc/hr for now. Will dc citrate protocol and use regular fluids. No heparin in circuit as pt is on systemic IV heparin.  2. Hyperkalemia - resolved 3. Metabolic acidosis - resolved 4. COVID PNA - w/ vent dependence, sp IV steroids/ remdesivir, on pred 50/d po. FiO2 high.  5. Hypotension/ shock - stable, on levo gtt at 11. Goal CVP 10 if on pressors.  Bolus prn, keep + 50 cc/hr on CRRT.  6. Anemia - Hb 10, mild decline 7. Suspected acute PE w/ cor pulmonale - sp systemic TPA on 8/6 and on getting full dose IV systemic heparin 8. Acute RLE DVT - as  above 9. DM/ hyperglycemia - on SQ insulin per CCM     Alex [name] 08/02/2020, 12:19 PM   Recent Labs  Lab 08/01/20 1627 08/01/20 1640 08/02/20 0035 08/02/20 0300  K 4.7   < > 4.2 4.0  3.8  BUN 56*   < > 47* 54*  54*  CREATININE 1.78*   < > 1.80* 1.97*  1.83*  CALCIUM 8.5*  --   --  9.1  8.8*  PHOS 5.5*  --   --  4.8*  4.6  HGB  --    < > 12.6* 10.7*   < > = values in this interval not displayed.   Inpatient medications: . artificial tears  1 application Both Eyes I2M  . vitamin C  500 mg Per Tube Daily  . chlorhexidine gluconate (MEDLINE KIT)  15 mL Mouth Rinse BID  . Chlorhexidine Gluconate Cloth  6 each Topical Daily  . docusate  100 mg Per Tube BID  . feeding supplement (PIVOT 1.5 CAL)  1,000 mL Per Tube Q24H  . feeding supplement (PROSource TF)  90 mL Per Tube 6 X Daily  . feeding supplement (PROSource TF)  90 mL Per Tube Daily  . insulin aspart  0-20 Units Subcutaneous Q4H  . insulin aspart  12 Units Subcutaneous Q4H  . insulin detemir  30 Units Subcutaneous BID  . mouth rinse  15 mL Mouth Rinse 10 times per day  . multivitamin with minerals  1 tablet Oral Daily  . pantoprazole (PROTONIX) IV  40 mg Intravenous Daily  . polyethylene glycol  17 g Per Tube Daily  . predniSONE  50 mg Per Tube Q breakfast  . sodium chloride flush  3 mL Intravenous Q12H  . zinc sulfate  220 mg Per Tube Daily   .  prismasol BGK 4/2.5 200 mL/hr at 08/02/20 0858  .  prismasol BGK 4/2.5 400 mL/hr at 08/02/20 0912  . sodium chloride Stopped (08/01/20 1620)  . amiodarone Stopped (07/31/20 2150)  . cisatracurium (NIMBEX) infusion 6 mcg/kg/min (08/02/20 1200)  . citrate dextrose 1,000 mL (08/02/20 0508)  . DOPamine Stopped (07/25/20 2244)  . fentaNYL infusion INTRAVENOUS 250 mcg/hr (08/02/20 1200)  . heparin 1,650 Units/hr (08/02/20 1209)  . norepinephrine (LEVOPHED) Adult infusion 2 mcg/min (08/02/20 1200)  . prismasol BGK 4/2.5 1,800 mL/hr at 08/02/20 1023  . propofol (DIPRIVAN)  infusion 50 mcg/kg/min (08/02/20 1200)  . vasopressin Stopped (07/24/20 2304)   sodium chloride, acetaminophen, alteplase, EPINEPHrine, fentaNYL, heparin, heparin, midazolam, ondansetron **OR** ondansetron (ZOFRAN) IV, sennosides, sodium chloride, vecuronium

## 2020-08-02 NOTE — Progress Notes (Signed)
Art line set up change was done on night shift last night per RN. RT will continue to monitor.

## 2020-08-02 NOTE — Progress Notes (Signed)
ANTICOAGULATION CONSULT NOTE - Follow Up Consult  Pharmacy Consult for Heparin Indication: acute DVT, presumed PE  No Known Allergies  Patient Measurements: Height: 5\' 9"  (175.3 cm) Weight: 94.7 kg (208 lb 12.4 oz) IBW/kg (Calculated) : 70.7 Heparin Dosing Weight:   Vital Signs: Temp: 97.9 F (36.6 C) (08/14 0400) Temp Source: Bladder (08/14 0000) Pulse Rate: 71 (08/14 0400)  Labs: Recent Labs    07/31/20 0154 07/31/20 0356 08/01/20 0241 08/01/20 0251 08/01/20 0944 08/01/20 1627 08/01/20 1640 08/02/20 0013 08/02/20 0013 08/02/20 0030 08/02/20 0035 08/02/20 0300  HGB 10.5*   < >  --   --    < >  --    < > 13.9   < >  --  12.6* 10.7*  HCT 34.0*   < >  --   --    < >  --    < > 41.0  --   --  37.0* 34.3*  PLT 222  --   --   --   --   --   --   --   --   --   --  207  APTT 64*  --   --  108*  --   --   --   --   --   --   --  66*  HEPARINUNFRC 0.44   < > 0.86*  --   --  0.46  --   --   --  0.47  --   --   CREATININE 1.79*   < > 1.73*  --    < > 1.78*   < > 1.30*  --   --  1.80* 1.83*   < > = values in this interval not displayed.    Estimated Creatinine Clearance: 46.9 mL/min (A) (by C-G formula based on SCr of 1.83 mg/dL (H)).   Medications:  Infusions:  .  prismasol BGK 4/2.5 400 mL/hr at 08/02/20 0259  . sodium chloride Stopped (08/01/20 1620)  . amiodarone Stopped (07/31/20 2150)  . calcium gluconate infusion for CRRT 20 g (08/02/20 0046)  . cisatracurium (NIMBEX) infusion 6 mcg/kg/min (08/02/20 0400)  . citrate dextrose 1,000 mL (08/02/20 0259)  . DOPamine Stopped (07/25/20 2244)  . fentaNYL infusion INTRAVENOUS 250 mcg/hr (08/02/20 0400)  . heparin 1,650 Units/hr (08/02/20 0400)  . norepinephrine (LEVOPHED) Adult infusion 14 mcg/min (08/02/20 0400)  . prismasol B22GK 4/0 1,800 mL/hr at 08/01/20 2344  . propofol (DIPRIVAN) infusion 50 mcg/kg/min (08/02/20 0400)  . vasopressin Stopped (07/24/20 2304)    Assessment: Patient with heparin level at goal.   No heparin issues noted.  Goal of Therapy:  Heparin level 0.3-0.7 units/ml Monitor platelets by anticoagulation protocol: Yes   Plan:  Continue heparin drip at current rate Recheck level with AM labs  Nani Skillern Crowford 08/02/2020,4:14 AM

## 2020-08-03 ENCOUNTER — Inpatient Hospital Stay (HOSPITAL_COMMUNITY): Payer: Medicaid Other

## 2020-08-03 DIAGNOSIS — R579 Shock, unspecified: Secondary | ICD-10-CM

## 2020-08-03 DIAGNOSIS — J9601 Acute respiratory failure with hypoxia: Secondary | ICD-10-CM | POA: Diagnosis not present

## 2020-08-03 DIAGNOSIS — U071 COVID-19: Secondary | ICD-10-CM | POA: Diagnosis not present

## 2020-08-03 LAB — CBC
HCT: 30.4 % — ABNORMAL LOW (ref 39.0–52.0)
Hemoglobin: 9.3 g/dL — ABNORMAL LOW (ref 13.0–17.0)
MCH: 28.1 pg (ref 26.0–34.0)
MCHC: 30.6 g/dL (ref 30.0–36.0)
MCV: 91.8 fL (ref 80.0–100.0)
Platelets: 153 10*3/uL (ref 150–400)
RBC: 3.31 MIL/uL — ABNORMAL LOW (ref 4.22–5.81)
RDW: 17.1 % — ABNORMAL HIGH (ref 11.5–15.5)
WBC: 16.5 10*3/uL — ABNORMAL HIGH (ref 4.0–10.5)
nRBC: 3 % — ABNORMAL HIGH (ref 0.0–0.2)

## 2020-08-03 LAB — RENAL FUNCTION PANEL
Albumin: 2 g/dL — ABNORMAL LOW (ref 3.5–5.0)
Albumin: 2 g/dL — ABNORMAL LOW (ref 3.5–5.0)
Anion gap: 11 (ref 5–15)
Anion gap: 14 (ref 5–15)
BUN: 68 mg/dL — ABNORMAL HIGH (ref 8–23)
BUN: 76 mg/dL — ABNORMAL HIGH (ref 8–23)
CO2: 21 mmol/L — ABNORMAL LOW (ref 22–32)
CO2: 22 mmol/L (ref 22–32)
Calcium: 8.4 mg/dL — ABNORMAL LOW (ref 8.9–10.3)
Calcium: 8.5 mg/dL — ABNORMAL LOW (ref 8.9–10.3)
Chloride: 101 mmol/L (ref 98–111)
Chloride: 101 mmol/L (ref 98–111)
Creatinine, Ser: 2.19 mg/dL — ABNORMAL HIGH (ref 0.61–1.24)
Creatinine, Ser: 2.24 mg/dL — ABNORMAL HIGH (ref 0.61–1.24)
GFR calc Af Amer: 35 mL/min — ABNORMAL LOW (ref >60)
GFR calc Af Amer: 36 mL/min — ABNORMAL LOW (ref >60)
GFR calc non Af Amer: 30 mL/min — ABNORMAL LOW (ref >60)
GFR calc non Af Amer: 31 mL/min — ABNORMAL LOW (ref >60)
Glucose, Bld: 149 mg/dL — ABNORMAL HIGH (ref 70–99)
Glucose, Bld: 198 mg/dL — ABNORMAL HIGH (ref 70–99)
Phosphorus: 4.5 mg/dL (ref 2.5–4.6)
Phosphorus: 5.9 mg/dL — ABNORMAL HIGH (ref 2.5–4.6)
Potassium: 4.4 mmol/L (ref 3.5–5.1)
Potassium: 4.9 mmol/L (ref 3.5–5.1)
Sodium: 134 mmol/L — ABNORMAL LOW (ref 135–145)
Sodium: 136 mmol/L (ref 135–145)

## 2020-08-03 LAB — POCT I-STAT, CHEM 8
BUN: 41 mg/dL — ABNORMAL HIGH (ref 8–23)
BUN: 48 mg/dL — ABNORMAL HIGH (ref 8–23)
Calcium, Ion: 0.32 mmol/L — CL (ref 1.15–1.40)
Calcium, Ion: 1.08 mmol/L — ABNORMAL LOW (ref 1.15–1.40)
Chloride: 100 mmol/L (ref 98–111)
Chloride: 98 mmol/L (ref 98–111)
Creatinine, Ser: 1.4 mg/dL — ABNORMAL HIGH (ref 0.61–1.24)
Creatinine, Ser: 1.8 mg/dL — ABNORMAL HIGH (ref 0.61–1.24)
Glucose, Bld: 198 mg/dL — ABNORMAL HIGH (ref 70–99)
Glucose, Bld: 226 mg/dL — ABNORMAL HIGH (ref 70–99)
HCT: 34 % — ABNORMAL LOW (ref 39.0–52.0)
HCT: 39 % (ref 39.0–52.0)
Hemoglobin: 11.6 g/dL — ABNORMAL LOW (ref 13.0–17.0)
Hemoglobin: 13.3 g/dL (ref 13.0–17.0)
Potassium: 3.7 mmol/L (ref 3.5–5.1)
Potassium: 3.7 mmol/L (ref 3.5–5.1)
Sodium: 134 mmol/L — ABNORMAL LOW (ref 135–145)
Sodium: 136 mmol/L (ref 135–145)
TCO2: 23 mmol/L (ref 22–32)
TCO2: 24 mmol/L (ref 22–32)

## 2020-08-03 LAB — HEPARIN LEVEL (UNFRACTIONATED)
Heparin Unfractionated: 0.27 IU/mL — ABNORMAL LOW (ref 0.30–0.70)
Heparin Unfractionated: 0.41 IU/mL (ref 0.30–0.70)

## 2020-08-03 LAB — GLUCOSE, CAPILLARY
Glucose-Capillary: 131 mg/dL — ABNORMAL HIGH (ref 70–99)
Glucose-Capillary: 103 mg/dL — ABNORMAL HIGH (ref 70–99)
Glucose-Capillary: 181 mg/dL — ABNORMAL HIGH (ref 70–99)
Glucose-Capillary: 177 mg/dL — ABNORMAL HIGH (ref 70–99)
Glucose-Capillary: 130 mg/dL — ABNORMAL HIGH (ref 70–99)
Glucose-Capillary: 51 mg/dL — ABNORMAL LOW (ref 70–99)
Glucose-Capillary: 56 mg/dL — ABNORMAL LOW (ref 70–99)

## 2020-08-03 LAB — CALCIUM, IONIZED
Calcium, Ionized, Serum: 3.5 mg/dL — ABNORMAL LOW (ref 4.5–5.6)
Calcium, Ionized, Serum: 3.7 mg/dL — ABNORMAL LOW (ref 4.5–5.6)

## 2020-08-03 LAB — MAGNESIUM: Magnesium: 2.6 mg/dL — ABNORMAL HIGH (ref 1.7–2.4)

## 2020-08-03 LAB — APTT: aPTT: 60 seconds — ABNORMAL HIGH (ref 24–36)

## 2020-08-03 MED ORDER — SODIUM CHLORIDE 0.9 % IV BOLUS
500.0000 mL | Freq: Once | INTRAVENOUS | Status: AC
  Administered 2020-08-03: 500 mL via INTRAVENOUS

## 2020-08-03 MED ORDER — DEXTROSE 50 % IV SOLN
INTRAVENOUS | Status: AC
  Administered 2020-08-03: 50 mL
  Filled 2020-08-03: qty 50

## 2020-08-03 MED ORDER — MIDAZOLAM HCL 2 MG/2ML IJ SOLN
2.0000 mg | INTRAMUSCULAR | Status: DC | PRN
  Administered 2020-08-03 – 2020-08-05 (×3): 2 mg via INTRAVENOUS
  Filled 2020-08-03 (×4): qty 2

## 2020-08-03 MED ORDER — DEXTROSE 10 % IV SOLN: INTRAVENOUS | Status: DC

## 2020-08-03 NOTE — Progress Notes (Addendum)
NAME:  Alex [name], MRN:  [id number], DOB:  10-01-1956, LOS: 67 ADMISSION DATE:  08/03/2020, CONSULTATION DATE:  8/1 REFERRING MD:  Sedonia Small, CHIEF COMPLAINT:  Dyspnea   Brief History   64 y/o male with acute hypoxemic respiratory failure due to COVID 19 pneumonia admitted on 8/1  Past Medical History  Cigarette smoker DM2 Hypertension CKD?  Significant Hospital Events   8/1 admission 8/2 intubation 8/3 started CRRT 8/5 significant improvement in oxygenation; mid morning developed severe sudden shock of uncertain etiology, low grade temp 8/6 Given systemic TPA due to repeated episodes of shock and cardiac instability and concern for PE 8/11 still significantly hypoxemic, requiring intermittent paralytics.  Resumed proning 8/13 stopped proning 8/15 stopped amio low heart rate  Consults:  Nephrology  Procedures:  8/2 ETT >  8/2 L IJ CVL >  8/3 R IJ HD cath >   Significant Diagnostic Tests:  8/2 lower ext doppler/vascular ultrasound >> acute R DVT peroneal vein, left negative 8/2 renal ultrasound >> no hydro, findings consistent with medicorenal disease  Micro Data:  8/1 SARS COV 2 > positive 8/1 blood -negative 8/1  MRSA  PCR  Neg  8/5 blood cultures x 2 > neg     Antimicrobials/COVID Rx:  8/1 ceftriaxone > 8/5-8/7 8/1 remdesivir -completed 8/5 8/1 tocilizumab    Scheduled Meds: . artificial tears  1 application Both Eyes U1L  . vitamin C  500 mg Per Tube Daily  . chlorhexidine gluconate (MEDLINE KIT)  15 mL Mouth Rinse BID  . Chlorhexidine Gluconate Cloth  6 each Topical Daily  . docusate  100 mg Per Tube BID  . feeding supplement (PIVOT 1.5 CAL)  1,000 mL Per Tube Q24H  . feeding supplement (PROSource TF)  90 mL Per Tube 6 X Daily  . feeding supplement (PROSource TF)  90 mL Per Tube Daily  . insulin aspart  0-20 Units Subcutaneous Q4H  . insulin aspart  12 Units Subcutaneous Q4H  . insulin detemir  30 Units Subcutaneous BID  . mouth rinse  15 mL Mouth Rinse 10  times per day  . multivitamin with minerals  1 tablet Oral Daily  . pantoprazole (PROTONIX) IV  40 mg Intravenous Daily  . polyethylene glycol  17 g Per Tube Daily  . predniSONE  50 mg Per Tube Q breakfast  . sodium chloride flush  3 mL Intravenous Q12H  . zinc sulfate  220 mg Per Tube Daily   Continuous Infusions: .  prismasol BGK 4/2.5 200 mL/hr at 08/02/20 2015  .  prismasol BGK 4/2.5 400 mL/hr at 08/02/20 0912  . sodium chloride Stopped (08/01/20 1620)  . amiodarone Stopped (07/31/20 2150)  . cisatracurium (NIMBEX) infusion 6.5 mcg/kg/min (08/03/20 0600)  . citrate dextrose 1,000 mL (08/02/20 0508)  . DOPamine Stopped (07/25/20 2244)  . fentaNYL infusion INTRAVENOUS 250 mcg/hr (08/03/20 0600)  . heparin 1,650 Units/hr (08/03/20 0600)  . norepinephrine (LEVOPHED) Adult infusion 8 mcg/min (08/03/20 0600)  . prismasol BGK 4/2.5 1,800 mL/hr at 08/03/20 0417  . propofol (DIPRIVAN) infusion 50 mcg/kg/min (08/03/20 0600)  . vasopressin Stopped (07/24/20 2304)   PRN Meds:.sodium chloride, acetaminophen, alteplase, EPINEPHrine, fentaNYL, heparin, midazolam, ondansetron **OR** ondansetron (ZOFRAN) IV, sennosides, sodium chloride, vecuronium   Interim history/subjective:  Still on paralytics/ air trapping of vent and relatively dry by cvp     Objective   Blood pressure (!) 111/59, pulse 81, temperature 97.9 F (36.6 C), resp. rate (!) 34, height 5' 9"  (1.753 m), weight 94.6 kg,  SpO2 92 %. CVP:  [3 mmHg-5 mmHg] 4 mmHg  Vent Mode: PRVC FiO2 (%):  [60 %-90 %] 65 % Set Rate:  [34 bmp] 34 bmp Vt Set:  [560 mL] 560 mL PEEP:  [12 cmH20-14 cmH20] 14 cmH20 Plateau Pressure:  [34 cmH20-37 cmH20] 37 cmH20   Intake/Output Summary (Last 24 hours) at 08/03/2020 0603 Last data filed at 08/03/2020 0600 Gross per 24 hour  Intake 5937.43 ml  Output 803 ml  Net 5134.43 ml   Filed Weights   07/31/20 0435 08/01/20 0800 08/02/20 0500  Weight: 120 kg 94.7 kg 94.6 kg   CVP:  [3 mmHg-5 mmHg] 4  mmHg   Examination: Tmax 98.4 to  99.5 last 24h Pt sedated / paralyzed  Oropharynx et  Neck supple Lungs with a few scattered exp > insp rhonchi bilaterally RRR no s3 or or sign murmur - pulse rate relatively  low on amio/ levophed Abd soft with nl  excursion  Extr warm with no edema or clubbing noted      Resolved Hospital Problem list    Assessment & Plan:  ARDS due to COVID 19 pneumonia > worsening oxygenation on 8/6 Acute pulmonary edema due to acute renal failure Likely acute massive PE with acute cor pulmonale-> s/p systemic TPA on 8/6. - he is down to fio2 65% but air trapping may be contributing to low bp/ levophed dependency  Am 8/15  >>>  Continue low tidal volume ventilation, paralytics Steroids Anticoagulation for presumed massive PE Decreased RR to eliminate air trapping and recheck abg    Severe shock   Periodic bradycardia> no clear source, possibly due to hypothermia - hypovolemic am 8/15and still on levophed  >>>   Volume challeng am 8/15 and work on eliminate autopeep     Acute DVT R leg Continue anticoagulation  AKI with hyperkalemia BPH Metabolic acidosis-stable Continue CRRT  Not need to pull vol off   Hyperglycemia-better controlled today Levemir, tube feed coverage, SSI  Need for sedation for mechanical ventilation -RASS target: -5 -Continue Propofol fentanyl infusion per PAD protocol   PAF on amio - try off 8/15 due to relative bradycardia and risk pulmonary toxicity on high FIO2   Goals of care Palliative care consulted   We may well be approaching medical futility here in terms of likelihood of restoring meaningful quality of life > certainly that would be the case if he codes and would not do prolonged resuscitation in that setting     Best practice:  Diet: tube feeds Pain/Anxiety/Delirium protocol (if indicated): On fentanyl, propofol VAP protocol (if indicated): Per protocol DVT prophylaxis: heparin infusion GI prophylaxis:  Protonix Glucose control: basal bolus insulin Mobility: bed rest Code Status: full Family Communication:  fm wants full code for now  Disposition: remain in ICU   The patient is critically ill with multiple organ systems failure and requires high complexity decision making for assessment and support, frequent evaluation and titration of therapies, application of advanced monitoring technologies and extensive interpretation of multiple databases. Critical Care Time devoted to patient care services described in this note is 35 minutes.    Christinia Gully, MD Pul[facility] and [place] [phone]   After 7:00 pm call Elink  [phone]

## 2020-08-03 NOTE — Progress Notes (Signed)
Alex [name]  Subjective: net positive 3.7 L yest, no sig UOP.  No machine / clotting issues.   Vitals:   08/03/20 0800 08/03/20 0900 08/03/20 1000 08/03/20 1100  BP: (!) 110/59     Pulse:      Resp: (!) 34 (!) 34 (!) 34 (!) 34  Temp: (!) 97.5 F (36.4 C) (!) 97.5 F (36.4 C) (!) 97.3 F (36.3 C) (!) 97.2 F (36.2 C)  TempSrc:      SpO2:   (!) 87% (!) 85%  Weight:      Height:        Exam:   pt seen in ICU on vent, sedated and chemically paralzyed  chest is clear ant / lat bilat Cor reg no RG abd soft no ascites or hsm Ext no sig pitting edema R IJ temp HD cath      CXR 8/8 > Cardiac enlargement with bilateral perihilar and basilar airspace infiltrates, likely edema.   CXR 8/12 - better overall w/ dec'd infiltrates   Renal US 8/2 - 13-14 cm kidneys w/o hydro, +^'d echo seen w/ CKD   UA 8/2 - >300 prot, 20-50 rbc, 6-10 wbc, 0-5 epi   UP/C ratio = 3.3    UNa 32, UCr 147  Assessment/ Plan: 1. Renal failure - no old data, hx of "kidney disease" per patient at time of admission. Creat 8.4 on admit 8/2, +proteinuria 3 gm and microhematuria on admit. Renal US normal size kidneys w/ ^'d echo seen w/ chronic disease.  CRRT started 8/3. Became hypovolemic yesterday so goal was to keep +yesterday and he rec'd bolus fluids so was 3 L + yest.  Today CVP up from 3 > 7, on levo gtt at 7ug/min. Goal CVP 10 w/ pressors. Under admit wt, not sure this is accurate. No vol excess on exam. Cont + 50 cc/hr for now. Using all BGK 4/2.5 fluids. No hep in circuit, on systemic IV heparin.  2. COVID PNA - w/ vent dependence, sp IV steroids/ remdesivir, on pred 50/d po. FiO2 down 65%. Poor prognosis overall per pmd notes.  3. Hypotension/ shock - stable, as above.  4. Anemia - Hb 10, mild decline 5. Suspected acute PE w/ cor pulmonale - sp systemic TPA on 8/6 and on getting full dose IV systemic heparin 6. Acute RLE DVT - as above 7. DM/ hyperglycemia - on SQ  insulin per CCM     Alex [name] 08/03/2020, 11:20 AM   Recent Labs  Lab 08/02/20 1507 08/03/20 0012 08/03/20 0430  K 4.6 4.4  --   BUN 64* 68*  --   CREATININE 1.96* 2.19*  --   CALCIUM 8.4* 8.4*  --   PHOS 4.7* 4.5  --   HGB 9.4*  --  9.3*   Inpatient medications: . artificial tears  1 application Both Eyes Q1J  . vitamin C  500 mg Per Tube Daily  . chlorhexidine gluconate (MEDLINE KIT)  15 mL Mouth Rinse BID  . Chlorhexidine Gluconate Cloth  6 each Topical Daily  . docusate  100 mg Per Tube BID  . feeding supplement (PIVOT 1.5 CAL)  1,000 mL Per Tube Q24H  . feeding supplement (PROSource TF)  90 mL Per Tube 6 X Daily  . feeding supplement (PROSource TF)  90 mL Per Tube Daily  . insulin aspart  0-20 Units Subcutaneous Q4H  . insulin aspart  12 Units Subcutaneous Q4H  . insulin detemir  30 Units Subcutaneous BID  .  mouth rinse  15 mL Mouth Rinse 10 times per day  . multivitamin with minerals  1 tablet Oral Daily  . pantoprazole (PROTONIX) IV  40 mg Intravenous Daily  . polyethylene glycol  17 g Per Tube Daily  . predniSONE  50 mg Per Tube Q breakfast  . sodium chloride flush  3 mL Intravenous Q12H  . zinc sulfate  220 mg Per Tube Daily   .  prismasol BGK 4/2.5 200 mL/hr at 08/02/20 2015  .  prismasol BGK 4/2.5 400 mL/hr at 08/03/20 1036  . sodium chloride Stopped (08/01/20 1620)  . cisatracurium (NIMBEX) infusion 6.5 mcg/kg/min (08/03/20 1103)  . citrate dextrose 1,000 mL (08/02/20 0508)  . fentaNYL infusion INTRAVENOUS 300 mcg/hr (08/03/20 1100)  . heparin 1,850 Units/hr (08/03/20 1100)  . norepinephrine (LEVOPHED) Adult infusion 7 mcg/min (08/03/20 1100)  . prismasol BGK 4/2.5 1,800 mL/hr at 08/03/20 0956  . propofol (DIPRIVAN) infusion 50 mcg/kg/min (08/03/20 1100)   sodium chloride, acetaminophen, alteplase, fentaNYL, heparin, midazolam, ondansetron **OR** ondansetron (ZOFRAN) IV, sennosides, sodium chloride

## 2020-08-03 NOTE — Progress Notes (Addendum)
Pharmacy: Re- heparin  Patient's a 64 y.o M currently on heparin drip for acute DVT and presumed PE (s/p systemic alteplase on 8/6).  He's also currently on CRRT.  - heparin level is therapeutic at 0.41 (goal 0.3-0.7)  Plan: - continue with current heparin drip of 1850 units/hr - will f/u with AM labs and adjust rate if needed - monitor for s/sx bleeding  Dia Sitter, PharmD, BCPS 08/03/2020 5:47 PM

## 2020-08-03 NOTE — Progress Notes (Signed)
Initiated daily assessment. Bowel sounds noted to be hypoactive with distended abdomen. Residual checked, 150 cc of brown fluid removed. Laxatives administered. E-link notified. BIS also elevated, noted to be above 60 multiple times on this shift so far despite being maxed out on propofol and fentanyl. PRN versed and fentanyl administered. Elink notified for the possibility of an additional sedative to keep BIS within range.

## 2020-08-03 NOTE — Progress Notes (Signed)
eLink Physician-Brief Progress Note Patient Name: TREJAN BUDA DOB: 1956-06-05 MRN: [id number]   Date of Service  08/03/2020  HPI/Events of Note  Multiple issues: 1. Hypoglycemia - Blood glucose = 56 and 51. Given D50 and 2. Hypoxemia - Sat = 87%.  eICU Interventions  Plan: 1. D10W IV infusion to run at 40 mL/hour. 2. Titrate FiO2 to keep sat >= 90. 3. Portable CXR STAT.     Intervention Category Major Interventions: Hypoxemia - evaluation and management;Other:  Lysle Dingwall 08/03/2020, 11:38 PM

## 2020-08-03 NOTE — Progress Notes (Signed)
eLink Physician-Brief Progress Note Patient Name: Alex [name] DOB: [birth year] MRN: [id number]   Date of Service  08/03/2020  HPI/Events of Note  Multiple issues: 1. Abdomen distended and quiet and 2. BIS = 60's.  eICU Interventions  Will order: 1. Portable Abdominal film STAT. 2. Increase Versed to 2 mg IV Q 1 hour PRN BIS goal.      Intervention Category Major Interventions: Other:  Lysle Dingwall 08/03/2020, 8:47 PM

## 2020-08-03 NOTE — Progress Notes (Signed)
ANTICOAGULATION CONSULT NOTE - Follow Up Consult  Pharmacy Consult for Heparin Indication: acute DVT, presumed PE  No Known Allergies  Patient Measurements: Height: 5\' 9"  (175.3 cm) Weight: 94.6 kg (208 lb 8.9 oz) IBW/kg (Calculated) : 70.7 Heparin Dosing Weight: 95 kg  Vital Signs: Temp: 97.7 F (36.5 C) (08/15 0700) Temp Source: Bladder (08/15 0400) BP: 111/59 (08/15 0400) Pulse Rate: 81 (08/15 0400)  Labs: Recent Labs    08/01/20 0251 08/01/20 0944 08/01/20 1627 08/01/20 1640 08/02/20 0030 08/02/20 0035 08/02/20 0300 08/02/20 0300 08/02/20 1507 08/03/20 0012 08/03/20 0430  HGB  --    < >  --    < >  --    < > 10.7*   < > 9.4*  --  9.3*  HCT  --    < >  --    < >  --    < > 34.3*  --  30.8*  --  30.4*  PLT  --   --   --   --   --   --  207  --  178  --  153  APTT 108*  --   --   --   --   --  66*  --   --   --  60*  HEPARINUNFRC  --   --  0.46  --  0.47  --   --   --   --   --  0.27*  CREATININE  --    < > 1.78*   < >  --    < > 1.97*  1.83*  --  1.96* 2.19*  --    < > = values in this interval not displayed.    Estimated Creatinine Clearance: 39.2 mL/min (A) (by C-G formula based on SCr of 2.19 mg/dL (H)).  Assessment: 64 yo male with COVID PNA, now on CRRT, D-dimer rose from 7 to 17 to start IV heparin per Rx dosing for acute R DVT peroneal vein.   Significant Events: -8/6: Pt had bradycardia, hypotension. Pt given alteplase 50 mg IV infusion over 2 hrs for PE @ 16:08 -8/6: Heparin infusion resumed @ 21:41 s/p TPA with aPTT <80  Today, 08/03/20  Heparin level subtherapeutic (0.27) on heparin 1650 units/hr  CBC: Hgb down 9.3, Plt WNL and stable  No issues with bleeding or problems with infusion per d/w RN.   Goal of Therapy:  HL 0.3 - 0.7 units/ml  Monitor platelets by anticoagulation protocol: Yes   Plan:   increase heparin to 1850 units/hr  Check heparin level in 8 hours  Daily heparin level and CBC while on heparin  Monitor for  signs/symptoms of bleeding  Dolly Rias RPh 08/03/2020, 7:06 AM

## 2020-08-04 ENCOUNTER — Inpatient Hospital Stay (HOSPITAL_COMMUNITY): Payer: Medicaid Other

## 2020-08-04 DIAGNOSIS — U071 COVID-19: Secondary | ICD-10-CM | POA: Diagnosis not present

## 2020-08-04 DIAGNOSIS — J9601 Acute respiratory failure with hypoxia: Secondary | ICD-10-CM | POA: Diagnosis not present

## 2020-08-04 LAB — RENAL FUNCTION PANEL
Albumin: 2.1 g/dL — ABNORMAL LOW (ref 3.5–5.0)
Albumin: 2.1 g/dL — ABNORMAL LOW (ref 3.5–5.0)
Anion gap: 13 (ref 5–15)
Anion gap: 11 (ref 5–15)
BUN: 69 mg/dL — ABNORMAL HIGH (ref 8–23)
BUN: 71 mg/dL — ABNORMAL HIGH (ref 8–23)
CO2: 21 mmol/L — ABNORMAL LOW (ref 22–32)
CO2: 21 mmol/L — ABNORMAL LOW (ref 22–32)
Calcium: 8.5 mg/dL — ABNORMAL LOW (ref 8.9–10.3)
Calcium: 8.3 mg/dL — ABNORMAL LOW (ref 8.9–10.3)
Chloride: 101 mmol/L (ref 98–111)
Chloride: 101 mmol/L (ref 98–111)
Creatinine, Ser: 2.35 mg/dL — ABNORMAL HIGH (ref 0.61–1.24)
Creatinine, Ser: 2.27 mg/dL — ABNORMAL HIGH (ref 0.61–1.24)
GFR calc Af Amer: 33 mL/min — ABNORMAL LOW (ref >60)
GFR calc Af Amer: 34 mL/min — ABNORMAL LOW (ref >60)
GFR calc non Af Amer: 28 mL/min — ABNORMAL LOW (ref >60)
GFR calc non Af Amer: 30 mL/min — ABNORMAL LOW (ref >60)
Glucose, Bld: 134 mg/dL — ABNORMAL HIGH (ref 70–99)
Glucose, Bld: 152 mg/dL — ABNORMAL HIGH (ref 70–99)
Phosphorus: 4.9 mg/dL — ABNORMAL HIGH (ref 2.5–4.6)
Phosphorus: 5.8 mg/dL — ABNORMAL HIGH (ref 2.5–4.6)
Potassium: 4 mmol/L (ref 3.5–5.1)
Potassium: 4.8 mmol/L (ref 3.5–5.1)
Sodium: 135 mmol/L (ref 135–145)
Sodium: 133 mmol/L — ABNORMAL LOW (ref 135–145)

## 2020-08-04 LAB — APTT: aPTT: 68 seconds — ABNORMAL HIGH (ref 24–36)

## 2020-08-04 LAB — CALCIUM, IONIZED: Calcium, Ionized, Serum: 5.3 mg/dL (ref 4.5–5.6)

## 2020-08-04 LAB — HEPARIN LEVEL (UNFRACTIONATED): Heparin Unfractionated: 0.4 IU/mL (ref 0.30–0.70)

## 2020-08-04 LAB — GLUCOSE, CAPILLARY
Glucose-Capillary: 106 mg/dL — ABNORMAL HIGH (ref 70–99)
Glucose-Capillary: 117 mg/dL — ABNORMAL HIGH (ref 70–99)
Glucose-Capillary: 177 mg/dL — ABNORMAL HIGH (ref 70–99)
Glucose-Capillary: 178 mg/dL — ABNORMAL HIGH (ref 70–99)
Glucose-Capillary: 143 mg/dL — ABNORMAL HIGH (ref 70–99)

## 2020-08-04 LAB — CBC
HCT: 28.9 % — ABNORMAL LOW (ref 39.0–52.0)
Hemoglobin: 8.8 g/dL — ABNORMAL LOW (ref 13.0–17.0)
MCH: 28.1 pg (ref 26.0–34.0)
MCHC: 30.4 g/dL (ref 30.0–36.0)
MCV: 92.3 fL (ref 80.0–100.0)
Platelets: 133 10*3/uL — ABNORMAL LOW (ref 150–400)
RBC: 3.13 MIL/uL — ABNORMAL LOW (ref 4.22–5.81)
RDW: 17.4 % — ABNORMAL HIGH (ref 11.5–15.5)
WBC: 13 10*3/uL — ABNORMAL HIGH (ref 4.0–10.5)
nRBC: 3.2 % — ABNORMAL HIGH (ref 0.0–0.2)

## 2020-08-04 LAB — MAGNESIUM: Magnesium: 2.7 mg/dL — ABNORMAL HIGH (ref 1.7–2.4)

## 2020-08-04 NOTE — Progress Notes (Signed)
eLink Physician-Brief Progress Note Patient Name: Alex [name] DOB: [birth year] MRN: [id number]   Date of Service  08/04/2020  HPI/Events of Note  Hyperglycemia - Blood glucose = 106 --> 117.   eICU Interventions  Will decrease D10W IV infusion to 30 mL/hour.      Intervention Category Major Interventions: Hyperglycemia - active titration of insulin therapy  Lysle Dingwall 08/04/2020, 3:56 AM

## 2020-08-04 NOTE — Progress Notes (Signed)
NAME:  Alex [name], MRN:  [id number], DOB:  11-14-1956, LOS: 71 ADMISSION DATE:  08/11/2020, CONSULTATION DATE:  8/1 REFERRING MD:  Sedonia Small, CHIEF COMPLAINT:  Dyspnea   Brief History   64 y/o male with acute hypoxemic respiratory failure due to COVID 19 pneumonia admitted on 8/1  Past Medical History  Cigarette smoker DM2 Hypertension CKD?  Significant Hospital Events   8/1 admission 8/2 intubation 8/3 started CRRT 8/5 significant improvement in oxygenation; mid morning developed severe sudden shock of uncertain etiology, low grade temp 8/6 Given systemic TPA due to repeated episodes of shock and cardiac instability and concern for PE 8/11 still significantly hypoxemic, requiring intermittent paralytics.  Resumed proning 8/13 stopped proning as P/F ratio > 150 8/15 stopped amio low heart rate  Consults:  Nephrology  Procedures:  8/2 ETT >  8/2 L IJ CVL >  8/3 R IJ HD cath >   Significant Diagnostic Tests:  8/2 lower ext doppler/vascular ultrasound >> acute R DVT peroneal vein, left negative 8/2 renal ultrasound >> no hydro, findings consistent with medicorenal disease  Micro Data:  8/1 SARS COV 2 > positive 8/1 blood -negative 8/1  MRSA  PCR  Neg  8/5 blood cultures x 2 > neg     Antimicrobials/COVID Rx:  8/1 ceftriaxone > 8/5-8/7 8/1 remdesivir -completed 8/5 8/1 tocilizumab    Interim history/subjective:   Continues on paralytics, CVVH. Off pressors today morning.  Started on dextrose for hypoglycemia    Objective   Blood pressure 113/64, pulse 88, temperature 97.7 F (36.5 C), resp. rate (!) 34, height 5\' 9"  (1.753 m), weight 102.8 kg, SpO2 (!) 89 %. CVP:  [4 mmHg-11 mmHg] 9 mmHg  Vent Mode: PRVC FiO2 (%):  [65 %-75 %] 75 % Set Rate:  [34 bmp] 34 bmp Vt Set:  [560 mL] 560 mL PEEP:  [14 cmH20] 14 cmH20 Plateau Pressure:  [32 cmH20-43 cmH20] 43 cmH20   Intake/Output Summary (Last 24 hours) at 08/04/2020 0747 Last data filed at 08/04/2020 0700 Gross per  24 hour  Intake 5473.12 ml  Output 3 ml  Net 5470.12 ml   Filed Weights   08/01/20 0800 08/02/20 0500 08/04/20 0411  Weight: 94.7 kg 94.6 kg 102.8 kg   CVP:  [4 mmHg-11 mmHg] 9 mmHg   Examination: Gen:      No acute distress, chronically ill-appearing HEENT:  EOMI, sclera anicteric, ETT Neck:     No masses; no thyromegaly Lungs:    Clear to auscultation bilaterally; normal respiratory effort CV:         Regular rate and rhythm; no murmurs Abd:      + bowel sounds; soft, non-tender; no palpable masses, no distension Ext:    No edema; adequate peripheral perfusion Skin:      Warm and dry; no rash Neuro: Sedated, paralyzed   Labs/imaging reviewed Significant for BUN/creatinine 69/2.35, WBC 13, hemoglobin 8.8, platelets 133 Chest x-ray 8/16-worsening bilateral airspace disease  Resolved Hospital Problem list    Assessment & Plan:  ARDS due to COVID 19 pneumonia > worsening oxygenation on 8/6 Acute pulmonary edema due to acute renal failure Likely acute massive PE with acute cor pulmonale-> s/p systemic TPA on 8/6. Continue low tidal volume ventilation, paralytics Steroids Anticoagulation for DVT, presumed PE Follow ABG Attempt to wean off paralytics.  P/F ratio is adequate and does not need proning  Severe shock   Periodic bradycardia> no clear source, possibly due to hypothermia Weaned off pressors. Follow CVP.  May attempt volume removal later if blood pressure tolerates   Acute DVT R leg Continue anticoagulation  AKI with hyperkalemia BPH Metabolic acidosis-stable Continue CRRT    Hyperglycemia Levemir,SSI  Hold tube feed coverage due to hypoglycemia today  Need for sedation for mechanical ventilation -RASS target: -5 -Continue Propofol fentanyl infusion per PAD protocol   PAF on amio -Off amnio 8/15   Goals of care Palliative care consulted   We may well be approaching medical futility here in terms of likelihood of restoring meaningful quality of  life > certainly that would be the case if he codes and would not do prolonged resuscitation in that setting   Family meeting planned for later today.    Best practice:  Diet: tube feeds Pain/Anxiety/Delirium protocol (if indicated): On fentanyl, propofol VAP protocol (if indicated): Per protocol DVT prophylaxis: heparin infusion GI prophylaxis: Protonix Glucose control: Insulin as above Mobility: bed rest Code Status: full Family Communication: Family meeting pending Disposition: remain in ICU   The patient is critically ill with multiple organ system failure and requires high complexity decision making for assessment and support, frequent evaluation and titration of therapies, advanced monitoring, review of radiographic studies and interpretation of complex data.   Critical Care Time devoted to patient care services, exclusive of separately billable procedures, described in this note is 35 minutes.   [name] MD [facility] Please see [email or web address] for pager details.  08/04/2020, 7:55 AM

## 2020-08-04 NOTE — Progress Notes (Signed)
ANTICOAGULATION CONSULT NOTE - Follow Up Consult  Pharmacy Consult for Heparin Indication: acute DVT, presumed PE  No Known Allergies  Patient Measurements: Height: 5\' 9"  (175.3 cm) Weight: 102.8 kg (226 lb 10.1 oz) IBW/kg (Calculated) : 70.7 Heparin Dosing Weight: 95 kg  Vital Signs: Temp: 97.7 F (36.5 C) (08/16 0700) BP: 113/64 (08/16 0733) Pulse Rate: 88 (08/16 0733)  Labs: Recent Labs    08/02/20 0030 08/02/20 0300 08/02/20 0802 08/02/20 1507 08/02/20 1507 08/03/20 0012 08/03/20 0430 08/03/20 1645 08/04/20 0351  HGB   < > 10.7*   < > 9.4*  --   --  9.3*  --  8.8*  HCT  --  34.3*   < > 30.8*  --   --  30.4*  --  28.9*  PLT  --  207  --  178  --   --  153  --  133*  APTT  --  66*  --   --   --   --  60*  --  68*  HEPARINUNFRC   < >  --   --   --   --   --  0.27* 0.41 0.40  CREATININE  --  1.97*  1.83*   < > 1.96*   < > 2.19*  --  2.24* 2.35*   < > = values in this interval not displayed.    Estimated Creatinine Clearance: 38 mL/min (A) (by C-G formula based on SCr of 2.35 mg/dL (H)).  Assessment: 64 yo male with COVID PNA, now on CRRT, D-dimer rose from 7 to 17 to start IV heparin per Rx dosing for acute R DVT peroneal vein.   Significant Events: -8/6: Pt had bradycardia, hypotension. Pt given alteplase 50 mg IV infusion over 2 hrs for PE @ 16:08 -8/6: Heparin infusion resumed @ 21:41 s/p TPA with aPTT <80  Today, 08/04/20  Heparin level therapeutic (0.4) on heparin 1850 units/hr  CBC: Hgb decreasing, down to 8.8, Plt down to 133.  No issues with bleeding or problems with infusion per d/w RN.   Remains on CRRT (clotted 8/16 AM)  Goal of Therapy:  HL 0.3 - 0.7 units/ml  Monitor platelets by anticoagulation protocol: Yes   Plan:   Continue heparin 1850 units/hr  Daily heparin level and CBC while on heparin  Monitor for signs/symptoms of bleeding  Gretta Arab PharmD, BCPS Clinical Pharmacist [facility] main pharmacy [phone] 08/04/2020 7:45 AM

## 2020-08-04 NOTE — TOC Progression Note (Signed)
Transition of Care [facility]) - Progression Note    Patient Details  Name: Alex [name] MRN: [id number] Date of Birth: [birth year]  Transition of Care [facility]) CM/SW Contact  Leeroy Cha, RN Phone Number: 08/04/2020, 8:03 AM  Clinical Narrative:    REMAINS ON THE VENT , A.LINES IN PLACE, CRRT INFUSING, BUN 69/CREAT=2.35/ IV PARALYTIC/family meeting today for goc/ pt not responding to treatment,listed as futile in the md notes.    Expected Discharge Plan: Home/Self Care Barriers to Discharge: Continued Medical Work up  Expected Discharge Plan and Services Expected Discharge Plan: Home/Self Care       Living arrangements for the past 2 months: Single Family Home                                       Social Determinants of Health (SDOH) Interventions    Readmission Risk Interventions No flowsheet data found.

## 2020-08-04 NOTE — Progress Notes (Signed)
Sister Alex [name] came for family visit and meeting with MD [facility], stood outside of room for visit, this RN explained current equipment at bedside. Current patient status and severity discussed with sister Helene Kelp, sister reports that at this time her mother, sister and brother are not ready to visit or see patient but will go home and encourage a meeting with CCM MD in person, family recently lost another brother in the past 2 weeks and arestill grieving that loss. Sister would like to relay information discussed to family but brother Herbie [place] will still be primary contact. Patient has no spouse or kids and at this time his Mother is the final decision maker regarding goals of care but is [facility] and not able to talk on phone well. No changes in plan of care at this time, patient remains a full code. Palliative consult placed on 8/16 to continue discussion and GOC with family.

## 2020-08-04 NOTE — Progress Notes (Signed)
PT brother Gilverto Dileonardo wants to have a in person meeting with family and doctor on 08/06/2020 around 04:30 pm. This nurse told him that she would make day shift aware and see if it was possible. He stated that it would be better if they had the meeting off the unit in a [place] room.

## 2020-08-04 NOTE — Progress Notes (Signed)
[facility] Progress Note  Subjective: net positive 5,470 mL. Worsening CXR, fio2 back up to 70% with peep 14. Minimal to no urine output.  Vitals:   08/04/20 1135 08/04/20 1200 08/04/20 1300 08/04/20 1400  BP:  118/64    Pulse:      Resp:  (!) 34 (!) 34 (!) 34  Temp:  98.4 F (36.9 C) 98.2 F (36.8 C) 98.2 F (36.8 C)  TempSrc:      SpO2: 92%     Weight:      Height:        Physical Exam: BP 118/64 (BP Location: Left Wrist)   Pulse 88   Temp 98.2 F (36.8 C)   Resp (!) 34   Ht _0  (1.753 m)   Wt 102.8 kg   SpO2 92%   BMI 33.47 kg/m  Limited exam in the setting of the COVID-19 pandemic and to conserve PPE Gen: ill appearing, sedated Cardiac: reg rate Resp: bl chest rise, intubated Abd: nondistended Dialysis Access: RIJ temp HD catheter Neuro: paralyzed/sedated    CXR 8/8 > Cardiac enlargement with bilateral perihilar and basilar airspace infiltrates, likely edema.   CXR 8/12 - better overall w/ dec'd infiltrates   Renal US 8/2 - 13-14 cm kidneys w/o hydro, +^'d echo seen w/ CKD   UA 8/2 - >300 prot, 20-50 rbc, 6-10 wbc, 0-5 epi   UP/C ratio = 3.3    UNa 32, UCr 147 08/04/2020 cxr: lower lung volumes with progression in bilateral heterogeneous lung opacities   Recent Labs  Lab 08/03/20 0012 08/03/20 0430 08/03/20 1645 08/04/20 0351  K   < >  --  4.9 4.0  BUN   < >  --  76* 69*  CREATININE   < >  --  2.24* 2.35*  CALCIUM   < >  --  8.5* 8.5*  PHOS   < >  --  5.9* 4.9*  HGB  --  9.3*  --  8.8*   < > = values in this interval not displayed.   Inpatient medications: . artificial tears  1 application Both Eyes L8V  . vitamin C  500 mg Per Tube Daily  . chlorhexidine gluconate (MEDLINE KIT)  15 mL Mouth Rinse BID  . Chlorhexidine Gluconate Cloth  6 each Topical Daily  . docusate  100 mg Per Tube BID  . feeding supplement (PIVOT 1.5 CAL)  1,000 mL Per Tube Q24H  . feeding supplement (PROSource TF)  90 mL Per Tube 6 X Daily  . feeding  supplement (PROSource TF)  90 mL Per Tube Daily  . insulin aspart  0-20 Units Subcutaneous Q4H  . insulin aspart  12 Units Subcutaneous Q4H  . insulin detemir  30 Units Subcutaneous BID  . mouth rinse  15 mL Mouth Rinse 10 times per day  . multivitamin with minerals  1 tablet Oral Daily  . pantoprazole (PROTONIX) IV  40 mg Intravenous Daily  . polyethylene glycol  17 g Per Tube Daily  . predniSONE  50 mg Per Tube Q breakfast  . sodium chloride flush  3 mL Intravenous Q12H  . zinc sulfate  220 mg Per Tube Daily   .  prismasol BGK 4/2.5 200 mL/hr at 08/02/20 2015  .  prismasol BGK 4/2.5 400 mL/hr at 08/04/20 1306  . sodium chloride Stopped (08/01/20 1620)  . cisatracurium (NIMBEX) infusion 6.5 mcg/kg/min (08/04/20 1400)  . citrate dextrose 1,000 mL (08/02/20 0508)  . fentaNYL infusion INTRAVENOUS 250  mcg/hr (08/04/20 1400)  . heparin 1,850 Units/hr (08/04/20 1400)  . norepinephrine (LEVOPHED) Adult infusion Stopped (08/04/20 1126)  . prismasol BGK 4/2.5 1,800 mL/hr at 08/04/20 1057  . propofol (DIPRIVAN) infusion 50 mcg/kg/min (08/04/20 1400)   sodium chloride, acetaminophen, alteplase, fentaNYL, heparin, midazolam, ondansetron **OR** ondansetron (ZOFRAN) IV, sennosides, sodium chloride      Assessment/ Plan: 1. Renal failure - no old data, hx of "kidney disease" per patient at time of admission. Creat 8.4 on admit 8/2, +proteinuria 3 gm and microhematuria on admit. Renal US normal size kidneys w/ ^'d echo seen w/ chronic disease.  CRRT started 8/3. Became hypovolemic over the weekend therefore goal was to keep him slightly net positive (also rec'd boluses), CVP's better to 10. Worsening CXR today and net positive >5L. Will aim to keep him net even for now to neg 50 cc/hr for now. Using all BGK 4/2.5 fluids. No hep in circuit, on systemic IV heparin.  2. COVID PNA - w/ vent dependence, sp IV steroids/ remdesivir, on pred 58m daily, FIO2 back up to 70%. Poor prognosis overall per  primary. Appreciate help with palliative care 3. Hypotension/ shock - stable, as above. On Levo. 4. Anemia - Hb worse to 8.8 5. Suspected acute PE w/ cor pulmonale - sp systemic TPA on 8/6 and on getting full dose IV systemic heparin 6. Acute RLE DVT - as above 7. DM/ hyperglycemia - on SQ insulin per CCM   VGean Quint MD [facility]8/16/2021, 2:25 PM

## 2020-08-05 DIAGNOSIS — R531 Weakness: Secondary | ICD-10-CM

## 2020-08-05 DIAGNOSIS — Z7189 Other specified counseling: Secondary | ICD-10-CM

## 2020-08-05 DIAGNOSIS — U071 COVID-19: Secondary | ICD-10-CM | POA: Diagnosis not present

## 2020-08-05 DIAGNOSIS — Z515 Encounter for palliative care: Secondary | ICD-10-CM

## 2020-08-05 DIAGNOSIS — J9601 Acute respiratory failure with hypoxia: Secondary | ICD-10-CM | POA: Diagnosis not present

## 2020-08-05 LAB — GLUCOSE, CAPILLARY
Glucose-Capillary: 104 mg/dL — ABNORMAL HIGH (ref 70–99)
Glucose-Capillary: 114 mg/dL — ABNORMAL HIGH (ref 70–99)
Glucose-Capillary: 173 mg/dL — ABNORMAL HIGH (ref 70–99)
Glucose-Capillary: 181 mg/dL — ABNORMAL HIGH (ref 70–99)
Glucose-Capillary: 75 mg/dL (ref 70–99)
Glucose-Capillary: 95 mg/dL (ref 70–99)
Glucose-Capillary: 99 mg/dL (ref 70–99)

## 2020-08-05 LAB — RENAL FUNCTION PANEL
Albumin: 2.2 g/dL — ABNORMAL LOW (ref 3.5–5.0)
Albumin: 2.2 g/dL — ABNORMAL LOW (ref 3.5–5.0)
Anion gap: 9 (ref 5–15)
Anion gap: 11 (ref 5–15)
BUN: 65 mg/dL — ABNORMAL HIGH (ref 8–23)
BUN: 73 mg/dL — ABNORMAL HIGH (ref 8–23)
CO2: 21 mmol/L — ABNORMAL LOW (ref 22–32)
CO2: 21 mmol/L — ABNORMAL LOW (ref 22–32)
Calcium: 8.3 mg/dL — ABNORMAL LOW (ref 8.9–10.3)
Calcium: 8.3 mg/dL — ABNORMAL LOW (ref 8.9–10.3)
Chloride: 102 mmol/L (ref 98–111)
Chloride: 99 mmol/L (ref 98–111)
Creatinine, Ser: 2.17 mg/dL — ABNORMAL HIGH (ref 0.61–1.24)
Creatinine, Ser: 2.09 mg/dL — ABNORMAL HIGH (ref 0.61–1.24)
GFR calc Af Amer: 36 mL/min — ABNORMAL LOW (ref >60)
GFR calc Af Amer: 38 mL/min — ABNORMAL LOW (ref >60)
GFR calc non Af Amer: 31 mL/min — ABNORMAL LOW (ref >60)
GFR calc non Af Amer: 33 mL/min — ABNORMAL LOW (ref >60)
Glucose, Bld: 114 mg/dL — ABNORMAL HIGH (ref 70–99)
Glucose, Bld: 197 mg/dL — ABNORMAL HIGH (ref 70–99)
Phosphorus: 4.6 mg/dL (ref 2.5–4.6)
Phosphorus: 5.8 mg/dL — ABNORMAL HIGH (ref 2.5–4.6)
Potassium: 4.1 mmol/L (ref 3.5–5.1)
Potassium: 5.1 mmol/L (ref 3.5–5.1)
Sodium: 132 mmol/L — ABNORMAL LOW (ref 135–145)
Sodium: 131 mmol/L — ABNORMAL LOW (ref 135–145)

## 2020-08-05 LAB — CBC
HCT: 28.1 % — ABNORMAL LOW (ref 39.0–52.0)
Hemoglobin: 8.8 g/dL — ABNORMAL LOW (ref 13.0–17.0)
MCH: 28.9 pg (ref 26.0–34.0)
MCHC: 31.3 g/dL (ref 30.0–36.0)
MCV: 92.1 fL (ref 80.0–100.0)
Platelets: 111 10*3/uL — ABNORMAL LOW (ref 150–400)
RBC: 3.05 MIL/uL — ABNORMAL LOW (ref 4.22–5.81)
RDW: 17.8 % — ABNORMAL HIGH (ref 11.5–15.5)
WBC: 12.4 10*3/uL — ABNORMAL HIGH (ref 4.0–10.5)
nRBC: 2.6 % — ABNORMAL HIGH (ref 0.0–0.2)

## 2020-08-05 LAB — HEPARIN LEVEL (UNFRACTIONATED): Heparin Unfractionated: 0.37 IU/mL (ref 0.30–0.70)

## 2020-08-05 LAB — APTT: aPTT: 66 seconds — ABNORMAL HIGH (ref 24–36)

## 2020-08-05 LAB — TRIGLYCERIDES
Triglycerides: 136 mg/dL (ref <150)
Triglycerides: 164 mg/dL — ABNORMAL HIGH (ref <150)

## 2020-08-05 LAB — CALCIUM, IONIZED: Calcium, Ionized, Serum: 4.4 mg/dL — ABNORMAL LOW (ref 4.5–5.6)

## 2020-08-05 LAB — MAGNESIUM: Magnesium: 2.7 mg/dL — ABNORMAL HIGH (ref 1.7–2.4)

## 2020-08-05 MED ORDER — INSULIN DETEMIR 100 UNIT/ML ~~LOC~~ SOLN
15.0000 [IU] | Freq: Two times a day (BID) | SUBCUTANEOUS | Status: DC
  Administered 2020-08-05 – 2020-08-10 (×10): 15 [IU] via SUBCUTANEOUS
  Filled 2020-08-05 (×10): qty 0.15

## 2020-08-05 MED ORDER — INSULIN ASPART 100 UNIT/ML ~~LOC~~ SOLN
6.0000 [IU] | SUBCUTANEOUS | Status: DC
  Administered 2020-08-05 – 2020-08-10 (×23): 6 [IU] via SUBCUTANEOUS

## 2020-08-05 MED ORDER — PIVOT 1.5 CAL PO LIQD
1000.0000 mL | ORAL | Status: DC
  Administered 2020-08-05 – 2020-08-09 (×5): 1000 mL
  Filled 2020-08-05 (×6): qty 1000

## 2020-08-05 NOTE — Progress Notes (Signed)
Confirmed with CCM Dr. Vaughan Browner, Palliative Dr. Rowe Pavy, and brother Herbie [place], in regards to family meeting to occur on 8/18 at 36

## 2020-08-05 NOTE — Progress Notes (Signed)
[facility] Progress Note  Subjective: net positive 368cc. fio2 down to 60%, still requiring levo. Briefly off levo yesterday for around 6 hours.  Vitals:   08/05/20 1500 08/05/20 1515 08/05/20 1530 08/05/20 1545  BP:      Pulse:      Resp: (!) 34 (!) 34 (!) 34 (!) 34  Temp: 98.4 F (36.9 C) 98.2 F (36.8 C) 98.1 F (36.7 C) 97.9 F (36.6 C)  TempSrc:      SpO2: 92%     Weight:      Height:        Physical Exam: BP 118/67 (BP Location: Left Wrist)   Pulse 77   Temp 97.9 F (36.6 C)   Resp (!) 34   Ht '5\' 9"'$  (1.753 m)   Wt 102.8 kg   SpO2 92%   BMI 33.47 kg/m  Limited exam in the setting of the COVID-19 pandemic and to conserve PPE Gen: ill appearing, sedated Cardiac: reg rate Resp: bl chest rise, intubated Abd: nondistended Dialysis Access: RIJ temp HD catheter Neuro: paralyzed/sedated    CXR 8/8 > Cardiac enlargement with bilateral perihilar and basilar airspace infiltrates, likely edema.   CXR 8/12 - better overall w/ dec'd infiltrates   Renal US 8/2 - 13-14 cm kidneys w/o hydro, +^'d echo seen w/ CKD   UA 8/2 - >300 prot, 20-50 rbc, 6-10 wbc, 0-5 epi   UP/C ratio = 3.3    UNa 32, UCr 147 08/04/2020 cxr: lower lung volumes with progression in bilateral heterogeneous lung opacities   Recent Labs  Lab 08/04/20 0351 08/04/20 0351 08/04/20 1622 08/05/20 0400  K 4.0   < > 4.8 4.1  BUN 69*   < > 71* 65*  CREATININE 2.35*   < > 2.27* 2.17*  CALCIUM 8.5*   < > 8.3* 8.3*  PHOS 4.9*   < > 5.8* 4.6  HGB 8.8*  --   --  8.8*   < > = values in this interval not displayed.   Inpatient medications: . artificial tears  1 application Both Eyes A6T  . vitamin C  500 mg Per Tube Daily  . chlorhexidine gluconate (MEDLINE KIT)  15 mL Mouth Rinse BID  . Chlorhexidine Gluconate Cloth  6 each Topical Daily  . docusate  100 mg Per Tube BID  . feeding supplement (PIVOT 1.5 CAL)  1,000 mL Per Tube Q24H  . feeding supplement (PROSource TF)  90 mL Per Tube 6 X  Daily  . insulin aspart  0-20 Units Subcutaneous Q4H  . insulin aspart  6 Units Subcutaneous Q4H  . insulin detemir  15 Units Subcutaneous BID  . mouth rinse  15 mL Mouth Rinse 10 times per day  . multivitamin with minerals  1 tablet Oral Daily  . pantoprazole (PROTONIX) IV  40 mg Intravenous Daily  . polyethylene glycol  17 g Per Tube Daily  . predniSONE  50 mg Per Tube Q breakfast  . sodium chloride flush  3 mL Intravenous Q12H  . zinc sulfate  220 mg Per Tube Daily   .  prismasol BGK 4/2.5 200 mL/hr at 08/05/20 1412  .  prismasol BGK 4/2.5 400 mL/hr at 08/05/20 0205  . sodium chloride Stopped (08/01/20 1620)  . cisatracurium (NIMBEX) infusion 7 mcg/kg/min (08/05/20 1500)  . citrate dextrose 1,000 mL (08/02/20 0508)  . fentaNYL infusion INTRAVENOUS 300 mcg/hr (08/05/20 1500)  . heparin 1,850 Units/hr (08/05/20 1500)  . norepinephrine (LEVOPHED) Adult infusion 6 mcg/min (08/05/20  1500)  . prismasol BGK 4/2.5 1,800 mL/hr at 08/05/20 1404  . propofol (DIPRIVAN) infusion 55 mcg/kg/min (08/05/20 1500)   sodium chloride, acetaminophen, alteplase, fentaNYL, heparin, midazolam, ondansetron **OR** ondansetron (ZOFRAN) IV, sennosides, sodium chloride      Assessment/ Plan: 1. Renal failure - no old data, hx of "kidney disease" per patient at time of admission. Creat 8.4 on admit 8/2, +proteinuria 3 gm and microhematuria on admit. Renal US normal size kidneys w/ ^'d echo seen w/ chronic disease.  CRRT started 8/3.  Worsening CXR 8/16 and net positive >5L. Will aim to keep him net even for now to neg 50 cc/hr for now. Overall will try to achieve net even in regards to total daily balance. Using all BGK 4/2.5 fluids. No hep in circuit, on systemic IV heparin.  2. COVID PNA - w/ vent dependence, sp IV steroids/ remdesivir, on pred 48m daily. Poor prognosis overall per primary. Appreciate help with palliative care, family meeting pending for later this week 3. Hypotension/ shock - stable, as  above. On Levo. 4. Anemia - Hb worse to 8.8 5. Suspected acute PE w/ cor pulmonale - sp systemic TPA on 8/6 and on getting full dose IV systemic heparin 6. Acute RLE DVT - as above 7. DM/ hyperglycemia - on SQ insulin per CCM   VGean Quint MD [facility]8/16/2021, 2:25 PM

## 2020-08-05 NOTE — Progress Notes (Signed)
NAME:  Alex [name], MRN:  [id number], DOB:  10/02/1956, LOS: 17 ADMISSION DATE:  08/02/2020, CONSULTATION DATE:  8/1 REFERRING MD:  Sedonia Small, CHIEF COMPLAINT:  Dyspnea   Brief History   64 y/o male with acute hypoxemic respiratory failure due to COVID 19 pneumonia admitted on 8/1  Past Medical History  Cigarette smoker DM2 Hypertension CKD?  Significant Hospital Events   8/1 admission 8/2 intubation 8/3 started CRRT 8/5 significant improvement in oxygenation; mid morning developed severe sudden shock of uncertain etiology, low grade temp 8/6 Given systemic TPA due to repeated episodes of shock and cardiac instability and concern for PE 8/11 still significantly hypoxemic, requiring intermittent paralytics.  Resumed proning 8/13 stopped proning as P/F ratio > 150 8/15 stopped amio due to low heart rate   Consults:  Nephrology  Procedures:  8/2 ETT >  8/2 L IJ CVL >  8/3 R IJ HD cath >   Significant Diagnostic Tests:  8/2 lower ext doppler/vascular ultrasound >> acute R DVT peroneal vein, left negative 8/2 renal ultrasound >> no hydro, findings consistent with medicorenal disease  Micro Data:  8/1 SARS COV 2 > positive 8/1 blood -negative 8/1  MRSA  PCR  Neg  8/5 blood cultures x 2 > neg     Antimicrobials/COVID Rx:  8/1 ceftriaxone > 8/5-8/7 8/1 remdesivir -completed 8/5 8/1 tocilizumab    Interim history/subjective:   Continues on paralytics, CVVH. Restarted on nor epi  Objective   Blood pressure 111/62, pulse 77, temperature 99 F (37.2 C), resp. rate (!) 34, height 5\' 9"  (1.753 m), weight 102.8 kg, SpO2 93 %. CVP:  [6 mmHg-11 mmHg] 7 mmHg  Vent Mode: PRVC FiO2 (%):  [60 %-70 %] 60 % Set Rate:  [34 bmp] 34 bmp Vt Set:  [560 mL] 560 mL PEEP:  [14 cmH20] 14 cmH20 Plateau Pressure:  [42 cmH20-48 cmH20] 42 cmH20   Intake/Output Summary (Last 24 hours) at 08/05/2020 0837 Last data filed at 08/05/2020 0800 Gross per 24 hour  Intake 4486.41 ml  Output 4279 ml    Net 207.41 ml   Filed Weights   08/02/20 0500 08/04/20 0411 08/05/20 0422  Weight: 94.6 kg 102.8 kg 102.8 kg   CVP:  [6 mmHg-11 mmHg] 7 mmHg   Examination: Blood pressure 111/62, pulse 77, temperature 99 F (37.2 C), resp. rate (!) 34, height 5\' 9"  (1.753 m), weight 102.8 kg, SpO2 93 %. Gen:      No acute distress HEENT:  EOMI, sclera anicteric Neck:     No masses; no thyromegaly, ETT Lungs:    Clear to auscultation bilaterally; normal respiratory effort CV:         Regular rate and rhythm; no murmurs Abd:      + bowel sounds; soft, non-tender; no palpable masses, no distension Ext:    No edema; adequate peripheral perfusion Skin:      Warm and dry; no rash Neuro: Sedated   Labs/imaging reviewed Significant for sodium 132, BUN/creatinine 65/2.17, WBC 12.4, hemoglobin 8.8, platelets 111 WBC 12.4, hemoglobin 8.8, platelets 111 No new imaging  Resolved Hospital Problem list    Assessment & Plan:  ARDS due to COVID 19 pneumonia > worsening oxygenation on 8/6 Acute pulmonary edema due to acute renal failure Likely acute massive PE with acute cor pulmonale-> s/p systemic TPA on 8/6. Continue low tidal volume ventilation, paralytics Will attempt to wean off paralytics today Steroids Anticoagulation for DVT, presumed PE P/F ratio is adequate and does not need  proning  Severe shock   Periodic bradycardia> no clear source, possibly due to hypothermia Wean pressors as tolerated Keep even volume  Acute DVT R leg Continue anticoagulation  AKI with hyperkalemia BPH Metabolic acidosis-stable Continue CRRT    Hyperglycemia Levemir,SSI, tube feed coverage  Need for sedation for mechanical ventilation -RASS target: -5 Change propofol to Versed drip as triglycerides are elevated  PAF on amio -Off amnio 8/15  Goals of care Palliative care consulted   We may well be approaching medical futility here in terms of likelihood of restoring meaningful quality of life >  certainly that would be the case if he codes and would not do prolonged resuscitation in that setting   Family meeting planned for later this week. Brother and sister updated daily.    Best practice:  Diet: tube feeds Pain/Anxiety/Delirium protocol (if indicated): On fentanyl, propofol VAP protocol (if indicated): Per protocol DVT prophylaxis: heparin infusion GI prophylaxis: Protonix Glucose control: Insulin as above Mobility: bed rest Code Status: full Family Communication: Family meeting pending Disposition: remain in ICU   The patient is critically ill with multiple organ system failure and requires high complexity decision making for assessment and support, frequent evaluation and titration of therapies, advanced monitoring, review of radiographic studies and interpretation of complex data.   Critical Care Time devoted to patient care services, exclusive of separately billable procedures, described in this note is 35 minutes.   [name] MD [facility] Please see [email or web address] for pager details.  08/05/2020, 8:37 AM

## 2020-08-05 NOTE — Progress Notes (Signed)
Nutrition Follow-up  RD working remotely.  DOCUMENTATION CODES:   Obesity unspecified  INTERVENTION:  - will increase Pivot 1.5 to 35 ml/hr with 90 ml Prosource TF x6/day. - this regimen + kcal from current Propofol rate will provide 2632 kcal, 211 grams protein, and 637 ml free water.  - free water flush, if desired, to be per CCM.  NUTRITION DIAGNOSIS:   Increased nutrient needs related to acute illness, catabolic illness ([id number] infection) as evidenced by estimated needs -ongoing  GOAL:   Provide needs based on ASPEN/SCCM guidelines -met with TF regimen  MONITOR:   Vent status, TF tolerance, Labs, Weight trends, I & O's  ASSESSMENT:   64 year old male who quit smoking 2 weeks PTA. He presented to the ED due to 3 day hx of fever, feeling unwell, generalized weakness, and SOB. He family brought him in due to poor responsiveness with O2 of 45% in the ED. CXR showed evidence of bilateral interstitial and alveolar infiltrates. Patient is unvaccinated against COVID-19 and was diagnosed with COVID-19 this admission.  Significant Events: 8/2-admission; intubation; OGT placement; TF initiation 8/3-CRRT started early morning; initial RD assessment (NFPE completed) 8/5-severe shock 8/6-systemic TPA d/t suspected acute massive PE with acute cor pulmonale  8/11- proning; intermittent paralytics 8/12- OGT replaced 8/13- stopped proning   Patient remains in supine position since 8/13. He remains intubated and has OGT in place. He is receiving Pivot 1.5 @ 30 ml/hr with 90 ml Prosource TF x7/day. This regimen is providing 1640 kcal, 221 grams protein, and 546 ml free water.   Weight steeply down from 8/10-8/13 and now trending back up; remains below admission weight. Mild pitting edema to all extremities documented in flow sheet.   Per notes: - ARDS d/t COVID-19 PNA  - hopeful to wean from paralytics today - continuing CRRT - plan for family meeting later in the week     Patient is currently intubated on ventilator support MV: 18.8 L/min Temp (24hrs), Avg:98.4 F (36.9 C), Min:96.8 F (36 C), Max:99.7 F (37.6 C) Propofol: 33.8 ml/hr (892 kcal)  Labs reviewed; CBGs: 75, 104, 99 mg/dl, Na: 132 mmol/l, BUN: 65 mg/dl, creatinine: 2.17 mg/dl, Ca: 8.3 mg/dl, Mg: 2.7 mg/dl, GFR: 36 ml/min. K and Phos WDL.  Medications reviewed; 500 mg ascorbic acid/day, sliding scale novolog, 12 units novolog every 4 hours, 30 units levemir BID, 1 tablet multivitamin with minerals/day, 40 mg IV protonix/day, 17 g miralax/day, 220 mg zinc sulfate/day. Drips; nimbex @ 6.5 mcg/kg/min, fentanyl @ 250 mcg/hr, heparin @ 1850 units/hr, levo @ 5 mcg/min, propofol @ 50 mcg/kg/min.    Diet Order:   Diet Order            Diet NPO time specified  Diet effective now                 EDUCATION NEEDS:   No education needs have been identified at this time  Skin:  Skin Assessment: Skin Integrity Issues: Skin Integrity Issues:: DTI DTI: head and R heel (both new 8/11)  Last BM:  8/2  Height:   Ht Readings from Last 1 Encounters:  07/24/20 '5\' 9"'$  (1.753 m)    Weight:   Wt Readings from Last 1 Encounters:  08/05/20 102.8 kg    Estimated Nutritional Needs:  Kcal:  2550-2763 kcal (30-32.5 kcal/kg adjusted wt) Protein:  >/= 202 grams (1.8 grams/kg actual weight) Fluid:  >/= 2.2 L/day     Jarome Matin, MS, RD, LDN, CNSC Inpatient Clinical Dietitian RD pager #  available in AMION  After hours/weekend pager # available in AMION  

## 2020-08-05 NOTE — Consult Note (Signed)
Consultation Note Date: 08/05/2020   Patient Name: Alex [name]  DOB: [birth year]  MRN: [id number]  Age / Sex: 64 y.o., male  PCP: Lorelee Market, MD Referring Physician: [name], MD  Reason for Consultation: Establishing goals of care  HPI/Patient Profile: 64 y.o. male  admitted on 07/21/2020   Clinical Assessment and Goals of Care: Alex [name] is 64 years old.  He has a past medical history of cigarette smoking diabetes hypertension and possibly chronic kidney disease.  Patient is admitted to intensive care unit at [facility] in [place], [place] with acute hypoxic respiratory failure due to COVID-19 pneumonia.  He remains intubated and mechanically ventilated on pulmonary/critical care service.  Renal colleagues are also following, patient is on CRRT.  Patient with ongoing significant hypoxia, intermittently requiring paralytics as well as is on sedation and pressors.  Also with presumed pulmonary embolism and is on heparin.  Palliative consultation for goals of care discussions has been requested.  Alex [name] is seen and examined.  Bedside RN also present in the room.  Patient is on sedation.  Has some edema.  Discussed with bedside RN, chart reviewed.  Call placed and discussed with patient's brother Alex [name].  Introduced myself and palliative care as follows:  Palliative medicine is specialized medical care for people living with serious illness. It focuses on providing relief from the symptoms and stress of a serious illness. The goal is to improve quality of life for both the patient and the family.  Goals of care: Broad aims of medical therapy in relation to the patient's values and preferences. Our aim is to provide medical care aimed at enabling patients to achieve the goals that matter most to them, given the circumstances of their particular medical situation and their constraints.    We reviewed scope of current hospitalization.  Brother Alex [name] is thankful for the information he has been getting from critical care staff.  We discussed about next steps and broad goals of care discussions.  We discussed about the serious nature of patient's condition and the level of mechanical support that he is requiring.  Patient's brother Alex [name] states that he is in favor of DO NOT RESUSCITATE and that he is not necessarily in favor of the patient getting " tube after tube inserted, I feel that that will only prolong the inevitable."  However, he states that several of his family members are struggling with the patient's current condition, they have had a lot of death and loss in the family and have lost a sibling just a week ago.  Alex [name] seems to think that one of his sisters is keen on pursuing tracheostomy and long-term care if offered.  Offered active listening and supportive care as much as is possible over the telephone.  Acknowledged the serious nature of these discussions.  See below.   NEXT OF KIN Reportedly does not have a spouse or children.  Has siblings and mother who are primary decision makers.  SUMMARY OF RECOMMENDATIONS   Full code/full scope care  for now Goals of care discussions, family meeting has been requested by the patient's brother that has been scheduled for 08-07-19 21 at 13.  [name] and sister will also attend.  Patient's brother Alex [name] states that he wishes for everyone in his family to have face-to-face discussions with medical staff so as to be able to decide appropriately about the patient's goals of care.   Code Status/Advance Care Planning:  Full code    Symptom Management:      Palliative Prophylaxis:   Aspiration  Additional Recommendations (Limitations, Scope, Preferences):  Full Scope Treatment  Psycho-social/Spiritual:   Desire for further Chaplaincy support:yes  Additional Recommendations: Caregiving   Support/Resources  Prognosis:   Unable to determine  Discharge Planning: To Be Determined      Primary Diagnoses: Present on Admission: . Acute hypoxemic respiratory failure due to COVID-19 ([place]) . Shock circulatory ([place])   I have reviewed the medical record, interviewed the patient and family, and examined the patient. The following aspects are pertinent.  History reviewed. No pertinent past medical history. Social History   Socioeconomic History  . Marital status: Single    Spouse name: Not on file  . Number of children: Not on file  . Years of education: Not on file  . Highest education level: Not on file  Occupational History  . Not on file  Tobacco Use  . Smoking status: Not on file  Substance and Sexual Activity  . Alcohol use: Not on file  . Drug use: Not on file  . Sexual activity: Not on file  Other Topics Concern  . Not on file  Social History Narrative  . Not on file   Social Determinants of Health   Financial Resource Strain:   . Difficulty of Paying Living Expenses:   Food Insecurity:   . Worried About Charity fundraiser in the Last Year:   . Arboriculturist in the Last Year:   Transportation Needs:   . Film/video editor (Medical):   Marland Kitchen Lack of Transportation (Non-Medical):   Physical Activity:   . Days of Exercise per Week:   . Minutes of Exercise per Session:   Stress:   . Feeling of Stress :   Social Connections:   . Frequency of Communication with Friends and Family:   . Frequency of Social Gatherings with Friends and Family:   . Attends Religious Services:   . Active Member of Clubs or Organizations:   . Attends Archivist Meetings:   Marland Kitchen Marital Status:    No family history on file. Scheduled Meds: . artificial tears  1 application Both Eyes K4Y  . vitamin C  500 mg Per Tube Daily  . chlorhexidine gluconate (MEDLINE KIT)  15 mL Mouth Rinse BID  . Chlorhexidine Gluconate Cloth  6 each Topical Daily  . docusate  100 mg  Per Tube BID  . feeding supplement (PIVOT 1.5 CAL)  1,000 mL Per Tube Q24H  . feeding supplement (PROSource TF)  90 mL Per Tube 6 X Daily  . insulin aspart  0-20 Units Subcutaneous Q4H  . insulin aspart  12 Units Subcutaneous Q4H  . insulin detemir  30 Units Subcutaneous BID  . mouth rinse  15 mL Mouth Rinse 10 times per day  . multivitamin with minerals  1 tablet Oral Daily  . pantoprazole (PROTONIX) IV  40 mg Intravenous Daily  . polyethylene glycol  17 g Per Tube Daily  . predniSONE  50 mg Per  Tube Q breakfast  . sodium chloride flush  3 mL Intravenous Q12H  . zinc sulfate  220 mg Per Tube Daily   Continuous Infusions: .  prismasol BGK 4/2.5 200 mL/hr at 08/05/20 0557  .  prismasol BGK 4/2.5 400 mL/hr at 08/05/20 0205  . sodium chloride Stopped (08/01/20 1620)  . cisatracurium (NIMBEX) infusion 6.5 mcg/kg/min (08/05/20 1100)  . citrate dextrose 1,000 mL (08/02/20 0508)  . fentaNYL infusion INTRAVENOUS 250 mcg/hr (08/05/20 1100)  . heparin 1,850 Units/hr (08/05/20 1102)  . norepinephrine (LEVOPHED) Adult infusion 6 mcg/min (08/05/20 1100)  . prismasol BGK 4/2.5 1,800 mL/hr at 08/05/20 1120  . propofol (DIPRIVAN) infusion 50 mcg/kg/min (08/05/20 1104)   PRN Meds:.sodium chloride, acetaminophen, alteplase, fentaNYL, heparin, midazolam, ondansetron **OR** ondansetron (ZOFRAN) IV, sennosides, sodium chloride Medications Prior to Admission:  Prior to Admission medications   Medication Sig Start Date End Date Taking? Authorizing Provider  Acetaminophen (TYLENOL PO) Take 1-2 tablets by mouth every 6 (six) hours as needed (pain/fever/headache).   Yes [provider]  ezetimibe (ZETIA) 10 MG tablet Take 10 mg by mouth at bedtime. Patient not taking: Reported on 07/25/2020 03/08/20   [provider]  losartan (COZAAR) 100 MG tablet Take 100 mg by mouth daily. Patient not taking: Reported on 07/24/2020 03/08/20   [provider]  metFORMIN (GLUCOPHAGE) 1000 MG tablet  Take 1,000 mg by mouth 2 (two) times daily. Patient not taking: Reported on 08/03/2020 03/08/20   [provider]  pantoprazole (PROTONIX) 40 MG tablet Take 40 mg by mouth daily. Patient not taking: Reported on 08/14/2020 03/08/20   [provider]  pioglitazone (ACTOS) 45 MG tablet Take 45 mg by mouth daily. Patient not taking: Reported on 07/26/2020 03/08/20   [provider]  tiZANidine (ZANAFLEX) 4 MG tablet Take 4 mg by mouth 2 (two) times daily. Patient not taking: Reported on 07/31/2020 03/08/20   [provider]   No Known Allergies Review of Systems Nonverbal Physical Exam Patient is sedated, intubated and mechanically ventilated. Mechanical breath sounds, regular work of breathing Monitor S1-S2 Patient has CRRT, is on pressors, is on tube feeds Abdomen is not distended Has generalized edema extremities warm to touch  Vital Signs: BP 104/61   Pulse 77   Temp 98.6 F (37 C)   Resp (!) 34   Ht 5' 9"  (1.753 m)   Wt 102.8 kg   SpO2 92%   BMI 33.47 kg/m  Pain Scale: CPOT   Pain Score: 0-No pain   SpO2: SpO2: 92 % O2 Device:SpO2: 92 % O2 Flow Rate: .O2 Flow Rate (L/min): 45 L/min  IO: Intake/output summary:   Intake/Output Summary (Last 24 hours) at 08/05/2020 1150 Last data filed at 08/05/2020 1100 Gross per 24 hour  Intake 4421.15 ml  Output 4810 ml  Net -388.85 ml    LBM: Last BM Date: 08/02/20 Baseline Weight: Weight: (!) 108.9 kg Most recent weight: Weight: 102.8 kg     Palliative Assessment/Data:   PPS 10%  Time In:  9 Time Out:10.10   Time Total:  70  Greater than 50%  of this time was spent counseling and coordinating care related to the above assessment and plan.  Signed by: Loistine Chance, MD   Please contact Palliative Medicine Team phone at [phone] for questions and concerns.  For individual provider: See Shea Evans

## 2020-08-05 NOTE — Progress Notes (Signed)
ANTICOAGULATION CONSULT NOTE - Follow Up Consult  Pharmacy Consult for Heparin Indication: acute DVT, presumed PE  No Known Allergies  Patient Measurements: Height: 5\' 9"  (175.3 cm) Weight: 102.8 kg (226 lb 10.1 oz) IBW/kg (Calculated) : 70.7 Heparin Dosing Weight: 95 kg  Vital Signs: Temp: 99.3 F (37.4 C) (08/17 0700) Temp Source: Bladder (08/17 0400) BP: 101/59 (08/17 0400) Pulse Rate: 77 (08/17 0327)  Labs: Recent Labs    08/03/20 0430 08/03/20 0430 08/03/20 1645 08/04/20 0351 08/04/20 1622 08/05/20 0400  HGB 9.3*   < >  --  8.8*  --  8.8*  HCT 30.4*  --   --  28.9*  --  28.1*  PLT 153  --   --  133*  --  111*  APTT 60*  --   --  68*  --  66*  HEPARINUNFRC 0.27*   < > 0.41 0.40  --  0.37  CREATININE  --    < > 2.24* 2.35* 2.27* 2.17*   < > = values in this interval not displayed.    Estimated Creatinine Clearance: 41.2 mL/min (A) (by C-G formula based on SCr of 2.17 mg/dL (H)).  Assessment: 64 yo male with COVID PNA, now on CRRT, D-dimer rose from 7 to 17 to start IV heparin per Rx dosing for acute R DVT peroneal vein.   Significant Events: -8/6: Pt had bradycardia, hypotension. Pt given alteplase 50 mg IV infusion over 2 hrs for PE @ 16:08 -8/6: Heparin infusion resumed @ 21:41 s/p TPA with aPTT <80  Today, 08/05/20  Heparin level therapeutic (0.37) on heparin 1850 units/hr  CBC: Hgb low/stable at 8.8, Plt down to 111.  No issues with bleeding or problems with infusion per d/w RN.   Remains on CRRT (clotted 8/16 AM)  Goal of Therapy:  HL 0.3 - 0.7 units/ml  Monitor platelets by anticoagulation protocol: Yes   Plan:   Continue heparin 1850 units/hr  Daily heparin level and CBC while on heparin  Monitor for signs/symptoms of bleeding  Gretta Arab PharmD, BCPS Clinical Pharmacist [facility] main pharmacy [phone] 08/05/2020 7:33 AM

## 2020-08-05 NOTE — Progress Notes (Signed)
Discussed with Dr. Vaughan Browner that he would like to try and wean paralytics. Wean to keep vent synchrony

## 2020-08-06 ENCOUNTER — Inpatient Hospital Stay (HOSPITAL_COMMUNITY): Payer: Medicaid Other

## 2020-08-06 DIAGNOSIS — J069 Acute upper respiratory infection, unspecified: Secondary | ICD-10-CM

## 2020-08-06 LAB — BASIC METABOLIC PANEL
Anion gap: 11 (ref 5–15)
BUN: 67 mg/dL — ABNORMAL HIGH (ref 8–23)
CO2: 21 mmol/L — ABNORMAL LOW (ref 22–32)
Calcium: 8.5 mg/dL — ABNORMAL LOW (ref 8.9–10.3)
Chloride: 101 mmol/L (ref 98–111)
Creatinine, Ser: 2.05 mg/dL — ABNORMAL HIGH (ref 0.61–1.24)
GFR calc Af Amer: 39 mL/min — ABNORMAL LOW (ref >60)
GFR calc non Af Amer: 33 mL/min — ABNORMAL LOW (ref >60)
Glucose, Bld: 129 mg/dL — ABNORMAL HIGH (ref 70–99)
Potassium: 4.2 mmol/L (ref 3.5–5.1)
Sodium: 133 mmol/L — ABNORMAL LOW (ref 135–145)

## 2020-08-06 LAB — RENAL FUNCTION PANEL
Albumin: 2 g/dL — ABNORMAL LOW (ref 3.5–5.0)
Albumin: 2.3 g/dL — ABNORMAL LOW (ref 3.5–5.0)
Anion gap: 10 (ref 5–15)
Anion gap: 11 (ref 5–15)
BUN: 72 mg/dL — ABNORMAL HIGH (ref 8–23)
BUN: 68 mg/dL — ABNORMAL HIGH (ref 8–23)
CO2: 21 mmol/L — ABNORMAL LOW (ref 22–32)
CO2: 22 mmol/L (ref 22–32)
Calcium: 8.5 mg/dL — ABNORMAL LOW (ref 8.9–10.3)
Calcium: 8.4 mg/dL — ABNORMAL LOW (ref 8.9–10.3)
Chloride: 101 mmol/L (ref 98–111)
Chloride: 99 mmol/L (ref 98–111)
Creatinine, Ser: 2.11 mg/dL — ABNORMAL HIGH (ref 0.61–1.24)
Creatinine, Ser: 2.22 mg/dL — ABNORMAL HIGH (ref 0.61–1.24)
GFR calc Af Amer: 37 mL/min — ABNORMAL LOW
GFR calc Af Amer: 35 mL/min — ABNORMAL LOW (ref >60)
GFR calc non Af Amer: 32 mL/min — ABNORMAL LOW
GFR calc non Af Amer: 30 mL/min — ABNORMAL LOW (ref >60)
Glucose, Bld: 129 mg/dL — ABNORMAL HIGH (ref 70–99)
Glucose, Bld: 162 mg/dL — ABNORMAL HIGH (ref 70–99)
Phosphorus: 5.2 mg/dL — ABNORMAL HIGH (ref 2.5–4.6)
Phosphorus: 5.8 mg/dL — ABNORMAL HIGH (ref 2.5–4.6)
Potassium: 4.2 mmol/L (ref 3.5–5.1)
Potassium: 5.6 mmol/L — ABNORMAL HIGH (ref 3.5–5.1)
Sodium: 132 mmol/L — ABNORMAL LOW (ref 135–145)
Sodium: 132 mmol/L — ABNORMAL LOW (ref 135–145)

## 2020-08-06 LAB — GLUCOSE, CAPILLARY
Glucose-Capillary: 107 mg/dL — ABNORMAL HIGH (ref 70–99)
Glucose-Capillary: 107 mg/dL — ABNORMAL HIGH (ref 70–99)
Glucose-Capillary: 116 mg/dL — ABNORMAL HIGH (ref 70–99)
Glucose-Capillary: 127 mg/dL — ABNORMAL HIGH (ref 70–99)
Glucose-Capillary: 132 mg/dL — ABNORMAL HIGH (ref 70–99)
Glucose-Capillary: 144 mg/dL — ABNORMAL HIGH (ref 70–99)
Glucose-Capillary: 152 mg/dL — ABNORMAL HIGH (ref 70–99)

## 2020-08-06 LAB — CBC
HCT: 28 % — ABNORMAL LOW (ref 39.0–52.0)
Hemoglobin: 8.8 g/dL — ABNORMAL LOW (ref 13.0–17.0)
MCH: 29 pg (ref 26.0–34.0)
MCHC: 31.4 g/dL (ref 30.0–36.0)
MCV: 92.4 fL (ref 80.0–100.0)
Platelets: 101 K/uL — ABNORMAL LOW (ref 150–400)
RBC: 3.03 MIL/uL — ABNORMAL LOW (ref 4.22–5.81)
RDW: 18.2 % — ABNORMAL HIGH (ref 11.5–15.5)
WBC: 11.9 K/uL — ABNORMAL HIGH (ref 4.0–10.5)
nRBC: 1.4 % — ABNORMAL HIGH (ref 0.0–0.2)

## 2020-08-06 LAB — PHOSPHORUS: Phosphorus: 5.4 mg/dL — ABNORMAL HIGH (ref 2.5–4.6)

## 2020-08-06 LAB — CALCIUM, IONIZED: Calcium, Ionized, Serum: 4.5 mg/dL (ref 4.5–5.6)

## 2020-08-06 LAB — HEPARIN LEVEL (UNFRACTIONATED): Heparin Unfractionated: 0.33 [IU]/mL (ref 0.30–0.70)

## 2020-08-06 LAB — MAGNESIUM: Magnesium: 2.8 mg/dL — ABNORMAL HIGH (ref 1.7–2.4)

## 2020-08-06 LAB — APTT: aPTT: 77 s — ABNORMAL HIGH (ref 24–36)

## 2020-08-06 MED ORDER — SODIUM CHLORIDE 0.9% FLUSH
10.0000 mL | Freq: Two times a day (BID) | INTRAVENOUS | Status: DC
  Administered 2020-08-06 – 2020-08-10 (×9): 10 mL

## 2020-08-06 NOTE — Progress Notes (Signed)
NAME:  Alex [name], MRN:  [id number], DOB:  07/13/1956, LOS: 58 ADMISSION DATE:  07/21/2020, CONSULTATION DATE:  8/1 REFERRING MD:  Sedonia Small, CHIEF COMPLAINT:  Dyspnea   Brief History   64 y/o male with acute hypoxemic respiratory failure due to COVID 19 pneumonia admitted on 8/1  Past Medical History  Cigarette smoker DM2 Hypertension CKD?  Significant Hospital Events   8/1 admission 8/2 intubation 8/3 started CRRT 8/5 significant improvement in oxygenation; mid morning developed severe sudden shock of uncertain etiology, low grade temp 8/6 Given systemic TPA due to repeated episodes of shock and cardiac instability and concern for PE 8/11 still significantly hypoxemic, requiring intermittent paralytics.  Resumed proning 8/13 stopped proning as P/F ratio > 150 8/15 stopped amio due to low heart rate 8/18 unable to wean paralytics as patient becomes dyssynchronous   Consults:  Nephrology  Procedures:  8/2 ETT >  8/2 L IJ CVL >  8/3 R IJ HD cath >   Significant Diagnostic Tests:  8/2 lower ext doppler/vascular ultrasound >> acute R DVT peroneal vein, left negative 8/2 renal ultrasound >> no hydro, findings consistent with medicorenal disease  Micro Data:  8/1 SARS COV 2 > positive 8/1 blood -negative 8/1  MRSA  PCR  Neg  8/5 blood cultures x 2 > neg     Antimicrobials/COVID Rx:  8/1 ceftriaxone > 8/5-8/7 8/1 remdesivir -completed 8/5 8/1 tocilizumab    Interim history/subjective:   Continues on paralytics, CVVH, Levophed  Objective   Blood pressure 111/60, pulse 75, temperature (!) 96.8 F (36 C), resp. rate (!) 34, height 5\' 9"  (1.753 m), weight 102.8 kg, SpO2 93 %. CVP:  [5 mmHg-16 mmHg] 8 mmHg  Vent Mode: PRVC FiO2 (%):  [55 %-60 %] 60 % Set Rate:  [34 bmp] 34 bmp Vt Set:  [560 mL] 560 mL PEEP:  [14 cmH20] 14 cmH20 Plateau Pressure:  [37 cmH20-41 cmH20] 41 cmH20   Intake/Output Summary (Last 24 hours) at 08/06/2020 0923 Last data filed at 08/06/2020  0829 Gross per 24 hour  Intake 4342.41 ml  Output 5327 ml  Net -984.59 ml   Filed Weights   08/04/20 0411 08/05/20 0422 08/06/20 0243  Weight: 102.8 kg 102.8 kg 102.8 kg   CVP:  [5 mmHg-16 mmHg] 8 mmHg   Examination: Blood pressure 111/60, pulse 75, temperature (!) 96.8 F (36 C), resp. rate (!) 34, height 5\' 9"  (1.753 m), weight 102.8 kg, SpO2 93 %. Gen:      No acute distress HEENT:  EOMI, sclera anicteric, ETT Neck:     No masses; no thyromegaly Lungs:    Clear to auscultation bilaterally; normal respiratory effort CV:         Regular rate and rhythm; no murmurs Abd:      + bowel sounds; soft, non-tender; no palpable masses, no distension Ext:    No edema; adequate peripheral perfusion Skin:      Warm and dry; no rash Neuro: Sedated   Labs/imaging reviewed BUN/creatinine 67/2.05, WBC 11.9, hemoglobin 8.8, platelets 101 WBC 11.9, hemoglobin 8.8, platelets 101 Chest x-ray 8/18 bilateral airspace disease.  Resolved Hospital Problem list    Assessment & Plan:  ARDS due to COVID 19 pneumonia > worsening oxygenation on 8/6 Acute pulmonary edema due to acute renal failure Likely acute massive PE with acute cor pulmonale-> s/p systemic TPA on 8/6. Continue low tidal volume ventilation, paralytics Daily attempts to wean paralytics but we have been unsuccessful Steroids Anticoagulation for DVT, presumed  PE  Severe shock   Periodic bradycardia> no clear source, possibly due to hypothermia Wean pressors as tolerated Keep even volume  Acute DVT R leg Continue anticoagulation  AKI with hyperkalemia BPH Metabolic acidosis-stable Continue CRRT    Hyperglycemia Levemir,SSI, tube feed coverage  Need for sedation for mechanical ventilation -RASS target: -5 Change propofol to Versed drip as triglycerides are elevated  PAF on amio -Off amnio 8/15  Goals of care Palliative care consulted   We may well be approaching medical futility here in terms of likelihood of  restoring meaningful quality of life > certainly that would be the case if he codes and would not do prolonged resuscitation in that setting   Family meeting planned for later today Brother and sister updated daily.    Best practice:  Diet: tube feeds Pain/Anxiety/Delirium protocol (if indicated): On fentanyl, propofol VAP protocol (if indicated): Per protocol DVT prophylaxis: heparin infusion GI prophylaxis: Protonix Glucose control: Insulin as above Mobility: bed rest Code Status: full Family Communication: Family meeting pending Disposition: remain in ICU   The patient is critically ill with multiple organ system failure and requires high complexity decision making for assessment and support, frequent evaluation and titration of therapies, advanced monitoring, review of radiographic studies and interpretation of complex data.   Critical Care Time devoted to patient care services, exclusive of separately billable procedures, described in this note is 35 minutes.   [name] MD [facility] Please see [email or web address] for pager details.  08/06/2020, 9:23 AM

## 2020-08-06 NOTE — Progress Notes (Signed)
Daily Progress Note   Patient Name: Alex [name]       Date: 08/06/2020 DOB: 10-28-1956  Age: 64 y.o. MRN#: [id number] Attending Physician: [name], MD Primary Care Physician: Lorelee Market, MD Admit Date: 08/16/2020  Reason for Consultation/Follow-up: Establishing goals of care  Subjective: Patient remains intubated and mechanically ventilated.  Chart reviewed.  Discussed with bedside RN.  See below.  Length of Stay: 17  Current Medications: Scheduled Meds:  . artificial tears  1 application Both Eyes L2Z  . vitamin C  500 mg Per Tube Daily  . chlorhexidine gluconate (MEDLINE KIT)  15 mL Mouth Rinse BID  . Chlorhexidine Gluconate Cloth  6 each Topical Daily  . docusate  100 mg Per Tube BID  . feeding supplement (PIVOT 1.5 CAL)  1,000 mL Per Tube Q24H  . feeding supplement (PROSource TF)  90 mL Per Tube 6 X Daily  . insulin aspart  0-20 Units Subcutaneous Q4H  . insulin aspart  6 Units Subcutaneous Q4H  . insulin detemir  15 Units Subcutaneous BID  . mouth rinse  15 mL Mouth Rinse 10 times per day  . multivitamin with minerals  1 tablet Oral Daily  . pantoprazole (PROTONIX) IV  40 mg Intravenous Daily  . polyethylene glycol  17 g Per Tube Daily  . predniSONE  50 mg Per Tube Q breakfast  . sodium chloride flush  10-40 mL Intracatheter Q12H  . sodium chloride flush  3 mL Intravenous Q12H  . zinc sulfate  220 mg Per Tube Daily    Continuous Infusions: .  prismasol BGK 4/2.5 200 mL/hr at 08/06/20 0113  .  prismasol BGK 4/2.5 400 mL/hr at 08/06/20 0318  . sodium chloride Stopped (08/01/20 1620)  . cisatracurium (NIMBEX) infusion 5 mcg/kg/min (08/06/20 1600)  . fentaNYL infusion INTRAVENOUS 300 mcg/hr (08/06/20 1600)  . heparin 1,850 Units/hr (08/06/20 1600)  .  norepinephrine (LEVOPHED) Adult infusion Stopped (08/06/20 1134)  . prismasol BGK 4/2.5 1,800 mL/hr at 08/06/20 1510  . propofol (DIPRIVAN) infusion 50 mcg/kg/min (08/06/20 1600)    PRN Meds: sodium chloride, acetaminophen, alteplase, fentaNYL, heparin, midazolam, ondansetron **OR** ondansetron (ZOFRAN) IV, sennosides, sodium chloride  Physical Exam         Limited exam today in the setting of COVID-19 pandemic, conservation of PPE.  Patient was seen  and examined in detail at the time of initial consultation on 8-17 Noted to be sedated intubated and mechanically ventilated, has HD catheter, is on paralytics, medication history reviewed. Vital Signs: BP 117/62 (BP Location: Right Arm)   Pulse 85   Temp 98.6 F (37 C) (Bladder)   Resp (!) 23   Ht 5' 9"  (1.753 m)   Wt 102.8 kg   SpO2 95%   BMI 33.47 kg/m  SpO2: SpO2: 95 % O2 Device: O2 Device: Ventilator O2 Flow Rate: O2 Flow Rate (L/min): 45 L/min  Intake/output summary:   Intake/Output Summary (Last 24 hours) at 08/06/2020 1659 Last data filed at 08/06/2020 1633 Gross per 24 hour  Intake 4430.35 ml  Output 4923 ml  Net -492.65 ml   LBM: Last BM Date: 08/06/20 Baseline Weight: Weight: (!) 108.9 kg Most recent weight: Weight: 102.8 kg       Palliative Assessment/Data:      Patient Active Problem List   Diagnosis Date Noted  . Shock circulatory ([place]) 08/02/2020  . AKI (acute kidney injury) ([place])   . Acute hypoxemic respiratory failure due to COVID-19 [facility]) 08/08/2020    Palliative Care Assessment & Plan   Patient Profile:    Assessment: Acute hypoxic respiratory failure due to COVID-19 pneumonia Remains intubated and mechanically ventilated Remains on renal replacement therapy Being treated for presumed pulmonary embolism History of hypertension and diabetes  Recommendations/Plan:  A family meeting was held with the patient's siblings as well as his mother.  PCCM attending Dr. Vaughan Browner was also present.  We  met in the conference room. Initial introductions were given.  Brief life review was performed in an attempt to better understand who the patient is.  Patient's family describes him as an independent person who was an avid walker and liked to walk everywhere.  He occasionally would move in with a girlfriend but for the most part lives by himself.  He had a good functional status.  He was active and independent.  We discussed about patient's current condition and serious life limiting illness.  We discussed about the situation with patient's lungs and kidneys and his current status of ventilator dependent respiratory failure and acute kidney injury.  Goals wishes and values attempted to be explored.  We discussed extensively about concept of DO NOT RESUSCITATE.  Discussed that the patient undergoing chest compressions and shocks if indicated in the current setting might cause more harm than benefit.  Discussed frankly but compassionately about medical recommendation for considering DO NOT RESUSCITATE while continuing current therapies.  We also discussed about prognosis, overall goals of care, possible trajectory of illness.  Concepts such as tracheostomy, patient being ventilator dependent and dialysis dependent and requiring facilities that can handle such complex tubes and lines was discussed in detail.  Discussed that this would bring about a marked limitation in his overall quality of life and a marked departure from who he is as a person who values his independence.  [name] is very tearful about the whole situation.  Patient's family had excellent and extensive questions about his current condition and about his prognosis.  All of their questions were addressed to the best of my ability.  Family to continue discussions amongst themselves with regards to what we talked about today.  Family was thankful for communication from the critical care team.     Goals of Care and Additional  Recommendations:  Limitations on Scope of Treatment: Full Scope Treatment  Code Status:    Code  Status Orders  (From admission, onward)         Start     Ordered   08/01/20 1851  Full code  Continuous        08/01/20 1851        Code Status History    Date Active Date Inactive Code Status Order ID Comments User Context   08/01/2020 1623 08/01/2020 1851 Partial Code [id number]  [name], MD Inpatient   08/08/2020 1626 08/01/2020 1623 Full Code [id number]  Rigoberto Noel, MD ED   Advance Care Planning Activity       Prognosis:   Unable to determine  Discharge Planning:  To Be Determined  Care plan was discussed with bedside RN, Dr. Vaughan Browner, patient's brothers sisters and his mother and a family meeting for goals of care discussions.  See above.  Thank you for allowing the Palliative Medicine Team to assist in the care of this patient.   Time In: 1600 Time Out: 1645 Total Time 45 Prolonged Time Billed  no       Greater than 50%  of this time was spent counseling and coordinating care related to the above assessment and plan.  Loistine Chance, MD  Please contact Palliative Medicine Team phone at [phone] for questions and concerns.

## 2020-08-06 NOTE — Progress Notes (Signed)
[facility] Progress Note  Subjective: net neg ~400cc. On and off levo. Family meeting today. Not a trach candidate given high peep requirements. fio2 60%, peep 14. Anuric.  Vitals:   08/06/20 1200 08/06/20 1300 08/06/20 1400 08/06/20 1410  BP: (!) 95/46     Pulse: 91 89 90 88  Resp: (!) 25 12 (!) 50 (!) 27  Temp: 99.1 F (37.3 C) 99.5 F (37.5 C) 99.7 F (37.6 C) 99.7 F (37.6 C)  TempSrc:      SpO2: (!) 89% 90% 92% 94%  Weight:      Height:        Physical Exam: BP (!) 95/46   Pulse 88   Temp 99.7 F (37.6 C)   Resp (!) 27   Ht 5' 9" (1.753 m)   Wt 102.8 kg   SpO2 94%   BMI 33.47 kg/m  Limited exam in the setting of the COVID-19 pandemic and to conserve PPE Gen: ill appearing, sedated Cardiac: reg rate Resp: bl chest rise, intubated Abd: nondistended Dialysis Access: RIJ temp HD catheter Neuro: paralyzed/sedated    CXR 8/8 > Cardiac enlargement with bilateral perihilar and basilar airspace infiltrates, likely edema.   CXR 8/12 - better overall w/ dec'd infiltrates   Renal US 8/2 - 13-14 cm kidneys w/o hydro, +^'d echo seen w/ CKD   UA 8/2 - >300 prot, 20-50 rbc, 6-10 wbc, 0-5 epi   UP/C ratio = 3.3    UNa 32, UCr 147 08/04/2020 cxr: lower lung volumes with progression in bilateral heterogeneous lung opacities   Recent Labs  Lab 08/05/20 0400 08/05/20 0400 08/05/20 1552 08/06/20 0205  K 4.1   < > 5.1 4.2  4.2  BUN 65*   < > 73* 67*  72*  CREATININE 2.17*   < > 2.09* 2.05*  2.11*  CALCIUM 8.3*   < > 8.3* 8.5*  8.5*  PHOS 4.6   < > 5.8* 5.4*  5.2*  HGB 8.8*  --   --  8.8*   < > = values in this interval not displayed.   Inpatient medications: . artificial tears  1 application Both Eyes W2X  . vitamin C  500 mg Per Tube Daily  . chlorhexidine gluconate (MEDLINE KIT)  15 mL Mouth Rinse BID  . Chlorhexidine Gluconate Cloth  6 each Topical Daily  . docusate  100 mg Per Tube BID  . feeding supplement (PIVOT 1.5 CAL)  1,000 mL Per Tube  Q24H  . feeding supplement (PROSource TF)  90 mL Per Tube 6 X Daily  . insulin aspart  0-20 Units Subcutaneous Q4H  . insulin aspart  6 Units Subcutaneous Q4H  . insulin detemir  15 Units Subcutaneous BID  . mouth rinse  15 mL Mouth Rinse 10 times per day  . multivitamin with minerals  1 tablet Oral Daily  . pantoprazole (PROTONIX) IV  40 mg Intravenous Daily  . polyethylene glycol  17 g Per Tube Daily  . predniSONE  50 mg Per Tube Q breakfast  . sodium chloride flush  10-40 mL Intracatheter Q12H  . sodium chloride flush  3 mL Intravenous Q12H  . zinc sulfate  220 mg Per Tube Daily   .  prismasol BGK 4/2.5 200 mL/hr at 08/06/20 0113  .  prismasol BGK 4/2.5 400 mL/hr at 08/06/20 0318  . sodium chloride Stopped (08/01/20 1620)  . cisatracurium (NIMBEX) infusion 5 mcg/kg/min (08/06/20 1412)  . fentaNYL infusion INTRAVENOUS 300 mcg/hr (08/06/20 1400)  .  heparin 1,850 Units/hr (08/06/20 1400)  . norepinephrine (LEVOPHED) Adult infusion Stopped (08/06/20 1134)  . prismasol BGK 4/2.5 1,800 mL/hr at 08/06/20 1140  . propofol (DIPRIVAN) infusion 50 mcg/kg/min (08/06/20 1411)   sodium chloride, acetaminophen, alteplase, fentaNYL, heparin, midazolam, ondansetron **OR** ondansetron (ZOFRAN) IV, sennosides, sodium chloride      Assessment/ Plan: 1. Renal failure - no old data, hx of "kidney disease" per patient at time of admission. Creat 8.4 on admit 8/2, +proteinuria 3 gm and microhematuria on admit. Renal US normal size kidneys w/ ^'d echo seen w/ chronic disease.  CRRT started 8/3.  Worsening CXR 8/16 and net positive >5L. Will aim to keep him net even for now to neg 50 cc/hr for now. Overall will try to achieve net even in regards to total daily balance. No changes with CRRT rx today. Using all BGK 4/2.5 fluids. No hep in circuit, on systemic IV heparin.  2. COVID PNA - w/ vent dependence, sp IV steroids/ remdesivir, on pred 50mg daily. Poor prognosis overall per primary. Appreciate help with  palliative care, family meeting pending for today 3. Hypotension/ shock - stable, as above. On Levo. 4. Anemia - Hb 8.8 5. Suspected acute PE w/ cor pulmonale - sp systemic TPA on 8/6 and on getting full dose IV systemic heparin 6. Acute RLE DVT - as above 7. DM/ hyperglycemia - on SQ insulin per CCM   Vikas Singh, MD [facility] 

## 2020-08-06 NOTE — Progress Notes (Signed)
PM BMET results reviewed, K 5.6, notified on call Neph MD Posey Pronto of current CRRT fluids, no new orders received. Will keep pre/post and dialysate fluids BGK 4/2.5.

## 2020-08-06 NOTE — Progress Notes (Signed)
ANTICOAGULATION CONSULT NOTE - Follow Up Consult  Pharmacy Consult for Heparin Indication: acute DVT, presumed PE  No Known Allergies  Patient Measurements: Height: 5\' 9"  (175.3 cm) Weight: 102.8 kg (226 lb 10.5 oz) IBW/kg (Calculated) : 70.7 Heparin Dosing Weight: 95 kg  Vital Signs: Temp: 96.3 F (35.7 C) (08/18 0700) Temp Source: Bladder (08/18 0400) BP: 140/73 (08/18 0400) Pulse Rate: 75 (08/18 0354)  Labs: Recent Labs    08/04/20 0351 08/04/20 1622 08/05/20 0400 08/05/20 1552 08/06/20 0205  HGB 8.8*  --  8.8*  --  8.8*  HCT 28.9*  --  28.1*  --  28.0*  PLT 133*  --  111*  --  101*  APTT 68*  --  66*  --  77*  HEPARINUNFRC 0.40  --  0.37  --  0.33  CREATININE 2.35*   < > 2.17* 2.09* 2.05*   2.11*   < > = values in this interval not displayed.    Estimated Creatinine Clearance: 42.3 mL/min (A) (by C-G formula based on SCr of 2.11 mg/dL (H)).  Assessment: 64 yo male with COVID PNA, now on CRRT, D-dimer rose from 7 to 17 to start IV heparin per Rx dosing for acute R DVT peroneal vein.   Significant Events: -8/6: Pt had bradycardia, hypotension. Pt given alteplase 50 mg IV infusion over 2 hrs for PE @ 16:08 -8/6: Heparin infusion resumed @ 21:41 s/p TPA with aPTT <80  Today, 08/06/20  Heparin level remains therapeutic (0.33) on heparin 1850 units/hr  CBC: Hgb low/stable at 8.8, Plt down to 101.  No issues with bleeding or problems with infusion per d/w RN.   Remains on CRRT (last clotted 8/16 AM)  Goal of Therapy:  HL 0.3 - 0.7 units/ml  Monitor platelets by anticoagulation protocol: Yes   Plan:   Continue heparin 1850 units/hr  Daily heparin level and CBC while on heparin  Monitor for signs/symptoms of bleeding  Gretta Arab PharmD, BCPS Clinical Pharmacist [facility] main pharmacy [phone] 08/06/2020 8:35 AM

## 2020-08-07 ENCOUNTER — Inpatient Hospital Stay (HOSPITAL_COMMUNITY): Payer: Medicaid Other

## 2020-08-07 LAB — RENAL FUNCTION PANEL
Albumin: 2.2 g/dL — ABNORMAL LOW (ref 3.5–5.0)
Albumin: 2.1 g/dL — ABNORMAL LOW (ref 3.5–5.0)
Anion gap: 9 (ref 5–15)
Anion gap: 10 (ref 5–15)
BUN: 65 mg/dL — ABNORMAL HIGH (ref 8–23)
BUN: 60 mg/dL — ABNORMAL HIGH (ref 8–23)
CO2: 24 mmol/L (ref 22–32)
CO2: 21 mmol/L — ABNORMAL LOW (ref 22–32)
Calcium: 8.6 mg/dL — ABNORMAL LOW (ref 8.9–10.3)
Calcium: 8.3 mg/dL — ABNORMAL LOW (ref 8.9–10.3)
Chloride: 100 mmol/L (ref 98–111)
Chloride: 99 mmol/L (ref 98–111)
Creatinine, Ser: 1.98 mg/dL — ABNORMAL HIGH (ref 0.61–1.24)
Creatinine, Ser: 2.13 mg/dL — ABNORMAL HIGH (ref 0.61–1.24)
GFR calc Af Amer: 40 mL/min — ABNORMAL LOW (ref >60)
GFR calc Af Amer: 37 mL/min — ABNORMAL LOW (ref >60)
GFR calc non Af Amer: 35 mL/min — ABNORMAL LOW (ref >60)
GFR calc non Af Amer: 32 mL/min — ABNORMAL LOW (ref >60)
Glucose, Bld: 106 mg/dL — ABNORMAL HIGH (ref 70–99)
Glucose, Bld: 183 mg/dL — ABNORMAL HIGH (ref 70–99)
Phosphorus: 6.3 mg/dL — ABNORMAL HIGH (ref 2.5–4.6)
Phosphorus: 6.6 mg/dL — ABNORMAL HIGH (ref 2.5–4.6)
Potassium: 4.9 mmol/L (ref 3.5–5.1)
Potassium: 6.1 mmol/L — ABNORMAL HIGH (ref 3.5–5.1)
Sodium: 133 mmol/L — ABNORMAL LOW (ref 135–145)
Sodium: 130 mmol/L — ABNORMAL LOW (ref 135–145)

## 2020-08-07 LAB — CBC
HCT: 32.6 % — ABNORMAL LOW (ref 39.0–52.0)
Hemoglobin: 9.6 g/dL — ABNORMAL LOW (ref 13.0–17.0)
MCH: 28.4 pg (ref 26.0–34.0)
MCHC: 29.4 g/dL — ABNORMAL LOW (ref 30.0–36.0)
MCV: 96.4 fL (ref 80.0–100.0)
Platelets: 105 10*3/uL — ABNORMAL LOW (ref 150–400)
RBC: 3.38 MIL/uL — ABNORMAL LOW (ref 4.22–5.81)
RDW: 18.7 % — ABNORMAL HIGH (ref 11.5–15.5)
WBC: 12.3 10*3/uL — ABNORMAL HIGH (ref 4.0–10.5)
nRBC: 1.5 % — ABNORMAL HIGH (ref 0.0–0.2)

## 2020-08-07 LAB — GLUCOSE, CAPILLARY
Glucose-Capillary: 97 mg/dL (ref 70–99)
Glucose-Capillary: 151 mg/dL — ABNORMAL HIGH (ref 70–99)
Glucose-Capillary: 145 mg/dL — ABNORMAL HIGH (ref 70–99)
Glucose-Capillary: 159 mg/dL — ABNORMAL HIGH (ref 70–99)
Glucose-Capillary: 134 mg/dL — ABNORMAL HIGH (ref 70–99)
Glucose-Capillary: 136 mg/dL — ABNORMAL HIGH (ref 70–99)
Glucose-Capillary: 125 mg/dL — ABNORMAL HIGH (ref 70–99)

## 2020-08-07 LAB — HEPARIN LEVEL (UNFRACTIONATED)
Heparin Unfractionated: 0.28 IU/mL — ABNORMAL LOW (ref 0.30–0.70)
Heparin Unfractionated: 0.46 IU/mL (ref 0.30–0.70)

## 2020-08-07 LAB — APTT: aPTT: 61 seconds — ABNORMAL HIGH (ref 24–36)

## 2020-08-07 LAB — CALCIUM, IONIZED: Calcium, Ionized, Serum: 4.6 mg/dL (ref 4.5–5.6)

## 2020-08-07 LAB — MAGNESIUM: Magnesium: 3.3 mg/dL — ABNORMAL HIGH (ref 1.7–2.4)

## 2020-08-07 MED ORDER — HEPARIN (PORCINE) 25000 UT/250ML-% IV SOLN
2250.0000 [IU]/h | INTRAVENOUS | Status: DC
  Administered 2020-08-07 – 2020-08-08 (×3): 1950 [IU]/h via INTRAVENOUS
  Administered 2020-08-09: 2250 [IU]/h via INTRAVENOUS
  Filled 2020-08-07 (×3): qty 250

## 2020-08-07 NOTE — Progress Notes (Signed)
ANTICOAGULATION CONSULT NOTE - Follow Up Consult  Pharmacy Consult for Heparin Indication: acute DVT, presumed PE  No Known Allergies  Patient Measurements: Height: 5\' 9"  (175.3 cm) Weight: 102.8 kg (226 lb 10.5 oz) IBW/kg (Calculated) : 70.7 Heparin Dosing Weight: 95 kg  Vital Signs: Temp: 97.5 F (36.4 C) (08/19 0515) Temp Source: Bladder (08/19 0400) BP: 121/60 (08/19 0400) Pulse Rate: 90 (08/19 0515)  Labs: Recent Labs    08/05/20 0400 08/05/20 1552 08/06/20 0205 08/06/20 1611 08/07/20 0413  HGB 8.8*  --  8.8*  --   --   HCT 28.1*  --  28.0*  --   --   PLT 111*  --  101*  --   --   APTT 66*  --  77*  --  61*  HEPARINUNFRC 0.37  --  0.33  --  0.28*  CREATININE 2.17*   < > 2.05*  2.11* 2.22* 1.98*   < > = values in this interval not displayed.    Estimated Creatinine Clearance: 45.1 mL/min (A) (by C-G formula based on SCr of 1.98 mg/dL (H)).  Assessment: 64 yo male with COVID PNA, now on CRRT, D-dimer rose from 7 to 17 to start IV heparin per Rx dosing for acute R DVT peroneal vein.   Significant Events: -8/6: Pt had bradycardia, hypotension. Pt given alteplase 50 mg IV infusion over 2 hrs for PE @ 16:08 -8/6: Heparin infusion resumed @ 21:41 s/p TPA with aPTT <80  Today, 08/07/20  Heparin level slightly subtherapeutic this AM (0.28) on heparin 1850 units/hr  CBC: in process  No issues with bleeding or problems with infusion per d/w RN.   Remains on CRRT (last clotted 8/16 AM)  Goal of Therapy:  HL 0.3 - 0.7 units/ml  Monitor platelets by anticoagulation protocol: Yes   Plan:   Increase heparin to 1950 units/hr  Recheck heparin level in 8 hrs  Daily heparin level and CBC while on heparin  Monitor for signs/symptoms of bleeding  Leone Haven, PharmD 08/07/2020 5:34 AM

## 2020-08-07 NOTE — Progress Notes (Signed)
[facility] Progress Note  Subjective: family meeting yesterday- still deciding if pt would want all these aggressive measures.    Vitals:   08/07/20 1300 08/07/20 1333 08/07/20 1342 08/07/20 1400  BP:      Pulse: 79 78 77 76  Resp: (!) 32 (!) 34 (!) 34 (!) 34  Temp: 99 F (37.2 C) 98.8 F (37.1 C) 98.8 F (37.1 C) 98.8 F (37.1 C)  TempSrc:      SpO2: 91% (!) 82% (!) 86% 93%  Weight:      Height:        Physical Exam: BP 133/66 (BP Location: Right Arm)   Pulse 76   Temp 98.8 F (37.1 C)   Resp (!) 34   Ht 5' 9"  (1.753 m)   Wt 102.8 kg   SpO2 93%   BMI 33.47 kg/m  Limited exam in the setting of the COVID-19 pandemic and to conserve PPE    CXR 8/8 > Cardiac enlargement with bilateral perihilar and basilar airspace infiltrates, likely edema.   CXR 8/12 - better overall w/ dec'd infiltrates   Renal US 8/2 - 13-14 cm kidneys w/o hydro, +^'d echo seen w/ CKD   UA 8/2 - >300 prot, 20-50 rbc, 6-10 wbc, 0-5 epi   UP/C ratio = 3.3    UNa 32, UCr 147 08/04/2020 cxr: lower lung volumes with progression in bilateral heterogeneous lung opacities   Recent Labs  Lab 08/06/20 0205 08/06/20 0205 08/06/20 1611 08/07/20 0413  K 4.2  4.2   < > 5.6* 4.9  BUN 67*  72*   < > 68* 65*  CREATININE 2.05*  2.11*   < > 2.22* 1.98*  CALCIUM 8.5*  8.5*   < > 8.4* 8.6*  PHOS 5.4*  5.2*   < > 5.8* 6.3*  HGB 8.8*  --   --  9.6*   < > = values in this interval not displayed.   Inpatient medications: . artificial tears  1 application Both Eyes J1O  . vitamin C  500 mg Per Tube Daily  . chlorhexidine gluconate (MEDLINE KIT)  15 mL Mouth Rinse BID  . Chlorhexidine Gluconate Cloth  6 each Topical Daily  . docusate  100 mg Per Tube BID  . feeding supplement (PIVOT 1.5 CAL)  1,000 mL Per Tube Q24H  . feeding supplement (PROSource TF)  90 mL Per Tube 6 X Daily  . insulin aspart  0-20 Units Subcutaneous Q4H  . insulin aspart  6 Units Subcutaneous Q4H  . insulin detemir  15  Units Subcutaneous BID  . mouth rinse  15 mL Mouth Rinse 10 times per day  . multivitamin with minerals  1 tablet Oral Daily  . pantoprazole (PROTONIX) IV  40 mg Intravenous Daily  . polyethylene glycol  17 g Per Tube Daily  . predniSONE  50 mg Per Tube Q breakfast  . sodium chloride flush  10-40 mL Intracatheter Q12H  . sodium chloride flush  3 mL Intravenous Q12H  . zinc sulfate  220 mg Per Tube Daily   .  prismasol BGK 4/2.5 200 mL/hr at 08/06/20 0113  .  prismasol BGK 4/2.5 400 mL/hr at 08/07/20 0418  . sodium chloride Stopped (08/01/20 1620)  . cisatracurium (NIMBEX) infusion 2.5 mcg/kg/min (08/07/20 1400)  . fentaNYL infusion INTRAVENOUS 300 mcg/hr (08/07/20 1400)  . heparin 1,950 Units/hr (08/07/20 1400)  . norepinephrine (LEVOPHED) Adult infusion 14 mcg/min (08/07/20 1400)  . prismasol BGK 4/2.5 1,800 mL/hr at 08/07/20 1227  .  propofol (DIPRIVAN) infusion 50 mcg/kg/min (08/07/20 1400)   sodium chloride, acetaminophen, alteplase, fentaNYL, heparin, midazolam, ondansetron **OR** ondansetron (ZOFRAN) IV, sennosides, sodium chloride      Assessment/ Plan: 1. Renal failure - no old data, hx of "kidney disease" per patient at time of admission. Creat 8.4 on admit 8/2, +proteinuria 3 gm and microhematuria on admit. Renal US normal size kidneys w/ ^'d echo seen w/ chronic disease.  CRRT started 8/3.  Worsening CXR 8/16 and net positive >5L. Will aim to keep him net even for now to neg 50 cc/hr for now. Using all BGK 4/2.5 fluids. No hep in circuit, on systemic IV heparin.  No changes today 2. COVID PNA - w/ vent dependence, sp IV steroids/ remdesivir, on pred 87m daily. Poor prognosis overall per primary. Appreciate help with palliative care, family meeting pending for today 3. Hypotension/ shock - stable, as above. On Levo. 4. Anemia - Hb 8.8 5. Suspected acute PE w/ cor pulmonale - sp systemic TPA on 8/6 and on getting full dose IV systemic heparin 6. Acute RLE DVT - as  above 7. DM/ hyperglycemia - on SQ insulin per CCM 8. Dispo: appreciate palliative care   EMadelon LipsMD CFolsompgr [phone]

## 2020-08-07 NOTE — Progress Notes (Signed)
NAME:  Alex [name], MRN:  [id number], DOB:  05-08-1956, LOS: 5 ADMISSION DATE:  08/13/2020, CONSULTATION DATE:  8/1 REFERRING MD:  Sedonia Small, CHIEF COMPLAINT:  Dyspnea   Brief History   64 y/o male with acute hypoxemic respiratory failure due to COVID 19 pneumonia admitted on 8/1  Past Medical History  Cigarette smoker DM2 Hypertension CKD?  Significant Hospital Events   8/1 admission 8/2 intubation 8/3 started CRRT 8/5 significant improvement in oxygenation; mid morning developed severe sudden shock of uncertain etiology, low grade temp 8/6 Given systemic TPA due to repeated episodes of shock and cardiac instability and concern for PE 8/11 still significantly hypoxemic, requiring intermittent paralytics.  Resumed proning 8/13 stopped proning as P/F ratio > 150 8/15 stopped amio due to low heart rate 8/18 unable to wean paralytics as patient becomes dyssynchronous, Family meeting with palliative care.   Consults:  Nephrology  Procedures:  8/2 ETT >  8/2 L IJ CVL >  8/3 R IJ HD cath >   Significant Diagnostic Tests:  8/2 lower ext doppler/vascular ultrasound >> acute R DVT peroneal vein, left negative 8/2 renal ultrasound >> no hydro, findings consistent with medicorenal disease  Micro Data:  8/1 SARS COV 2 > positive 8/1 blood -negative 8/1  MRSA  PCR  Neg  8/5 blood cultures x 2 > neg    Antimicrobials/COVID Rx:  8/1 ceftriaxone > 8/5-8/7 8/1 remdesivir -completed 8/5 8/1 tocilizumab    Interim history/subjective:   Remains critically ill on CVVH, paralytics.  Unable to wean paralytics off. Family meeting yesterday with palliative care and ICU team.  He remains full code  Objective   Blood pressure 121/60, pulse 85, temperature (!) 97.5 F (36.4 C), resp. rate (!) 34, height 5\' 9"  (1.753 m), weight 102.8 kg, SpO2 90 %. CVP:  [7 mmHg-16 mmHg] 12 mmHg  Vent Mode: PRVC FiO2 (%):  [60 %] 60 % Set Rate:  [34 bmp] 34 bmp Vt Set:  [560 mL-5960 mL] 560 mL PEEP:   [14 cmH20] 14 cmH20 Plateau Pressure:  [28 cmH20-46 cmH20] 43 cmH20   Intake/Output Summary (Last 24 hours) at 08/07/2020 0755 Last data filed at 08/07/2020 0700 Gross per 24 hour  Intake 4207.68 ml  Output 4857 ml  Net -649.32 ml   Filed Weights   08/04/20 0411 08/05/20 0422 08/06/20 0243  Weight: 102.8 kg 102.8 kg 102.8 kg   CVP:  [7 mmHg-16 mmHg] 12 mmHg   Examination: Gen:      No acute distress, chronically ill-appearing HEENT:  EOMI, sclera anicteric Neck:     No masses; no thyromegaly, ETT Lungs:    Scattered rhonchi CV:         Regular rate and rhythm; no murmurs Abd:      + bowel sounds; soft, non-tender; no palpable masses, no distension Ext:    1+ edema; adequate peripheral perfusion Skin:      Warm and dry; no rash Neuro: Sedated, paralyzed   Labs/imaging reviewed Sodium 133, BUN/creatinine 65/1.98 WBC 11.9, hemoglobin 8.8, platelets 101 Chest x-ray today with stable bilateral airspace disease.  Resolved Hospital Problem list    Assessment & Plan:  ARDS due to COVID 19 pneumonia > worsening oxygenation on 8/6 Acute pulmonary edema due to acute renal failure Likely acute massive PE with acute cor pulmonale-> s/p systemic TPA on 8/6. Continue low tidal volume ventilation Attempt daily to wean paralytics but so far have been unsuccessful due to vent dyssynchrony Continue prednisone Anticoagulation for known DVT  and presumed PE.  Severe shock   Periodic bradycardia> no clear source, possibly due to hypothermia Intermittently on pressors Telemetry monitoring  Acute DVT R leg Continue anticoagulation  AKI with hyperkalemia BPH Metabolic acidosis-stable Continue CRRT   Attempt to get fluid off today.  Hyperglycemia Levemir,SSI, tube feed coverage  Need for sedation for mechanical ventilation -RASS target: -4  PAF on amio -Off amnio 8/15  Goals of care Palliative care consulted  Family meeting yesterday. He may be headed for trach. Family is  unsure if they want to proceed with this.  They are discussing CODE STATUS among themselves   Best practice:  Diet: tube feeds Pain/Anxiety/Delirium protocol (if indicated): On fentanyl, propofol VAP protocol (if indicated): Per protocol DVT prophylaxis: heparin infusion GI prophylaxis: Protonix Glucose control: Insulin as above Mobility: bed rest Code Status: full Family Communication: Family meeting 8/18 Disposition: remain in ICU   The patient is critically ill with multiple organ system failure and requires high complexity decision making for assessment and support, frequent evaluation and titration of therapies, advanced monitoring, review of radiographic studies and interpretation of complex data.   Critical Care Time devoted to patient care services, exclusive of separately billable procedures, described in this note is 35 minutes.   [name] MD  Pulmonary and Critical Care Please see [email or web address] for pager details.  08/07/2020, 7:55 AM

## 2020-08-07 NOTE — Procedures (Signed)
Intubation Procedure Note  Alex [name]  [id number]  January 28, 1956  Date:08/07/20  Time:6:33 PM   Provider Performing:Trayven Lumadue    Procedure: Intubation (31500)  Indication(s) ET tube displacement, airleak, desats with low TVs  Consent Unable to obtain consent due to emergent nature of procedure.   Anesthesia Versed and Fentanyl   Time Out Verified patient identification, verified procedure, site/side was marked, verified correct patient position, special equipment/implants available, medications/allergies/relevant history reviewed, required imaging and test results available.   Sterile Technique Usual hand hygeine, masks, and gloves were used   Procedure Description Patient positioned in bed supine.  Sedation given as noted above.   ET tube was displaced out and unable to be advanced. The old ETT was changed over a tube exchanger.  Number of attempts was 1.  Colorimetric CO2 detector was consistent with tracheal placement.   Complications/Tolerance None; patient tolerated the procedure well. Chest X-ray is ordered to verify placement.   EBL Minimal   Specimen(s) None  .Alex Garfinkel MD [facility] Please see [email or web address] for pager details.  08/07/2020, 6:36 PM

## 2020-08-07 NOTE — Progress Notes (Signed)
ANTICOAGULATION CONSULT NOTE - Follow Up Consult  Pharmacy Consult for Heparin Indication: acute DVT, presumed PE  No Known Allergies  Patient Measurements: Height: 5\' 9"  (175.3 cm) Weight: 102.8 kg (226 lb 10.5 oz) IBW/kg (Calculated) : 70.7 Heparin Dosing Weight: 95 kg  Vital Signs: Temp: 99.3 F (37.4 C) (08/19 1600) Temp Source: Bladder (08/19 0800) BP: 105/60 (08/19 1600) Pulse Rate: 72 (08/19 1600)  Labs: Recent Labs    08/05/20 0400 08/05/20 1552 08/06/20 0205 08/06/20 1611 08/07/20 0413 08/07/20 1620  HGB 8.8*  --  8.8*  --  9.6*  --   HCT 28.1*  --  28.0*  --  32.6*  --   PLT 111*  --  101*  --  105*  --   APTT 66*  --  77*  --  61*  --   HEPARINUNFRC 0.37  --  0.33  --  0.28* 0.46  CREATININE 2.17*   < > 2.05*  2.11* 2.22* 1.98*  --    < > = values in this interval not displayed.    Estimated Creatinine Clearance: 45.1 mL/min (A) (by C-G formula based on SCr of 1.98 mg/dL (H)).  Assessment: 64 yo male with COVID PNA, now on CRRT, D-dimer rose from 7 to 17 to start IV heparin per Rx dosing for acute R DVT peroneal vein.   Significant Events: -8/6: Pt had bradycardia, hypotension. Pt given alteplase 50 mg IV infusion over 2 hrs for PE @ 16:08 -8/6: Heparin infusion resumed @ 21:41 s/p TPA with aPTT <80  Today, 08/07/20  Heparin level = 0.46 units/mL, now therapeutic after rate increase this morning  CBC: Hgb low/improved to 9.6, Pltc low but relatively stable at 105K.   No bleeding or infusion issues per nursing.   Remains on CRRT (last clotted 8/16 AM)  Goal of Therapy:  Heparin level 0.3 - 0.7 units/ml Monitor platelets by anticoagulation protocol: Yes   Plan:   Continue heparin infusion at 1950 units/hr  Recheck heparin level in 8 hours to ensure remains within therapeutic range  Daily heparin level and CBC while on heparin  Monitor for signs/symptoms of bleeding    Lindell Spar, PharmD, BCPS Clinical Pharmacist  08/07/2020 5:02  PM

## 2020-08-07 NOTE — Progress Notes (Signed)
Daily Progress Note   Patient Name: Alex [name]       Date: 08/07/2020 DOB: November 27, 1956  Age: 64 y.o. MRN#: [id number] Attending Physician: [name], MD Primary Care Physician: Lorelee Market, MD Admit Date: 07/22/2020  Reason for Consultation/Follow-up: Establishing goals of care  Subjective: Patient remains intubated and mechanically ventilated.  Chart reviewed.  Discussed with bedside RN.  See below.  Length of Stay: 18  Current Medications: Scheduled Meds:  . artificial tears  1 application Both Eyes V4Q  . vitamin C  500 mg Per Tube Daily  . chlorhexidine gluconate (MEDLINE KIT)  15 mL Mouth Rinse BID  . Chlorhexidine Gluconate Cloth  6 each Topical Daily  . docusate  100 mg Per Tube BID  . feeding supplement (PIVOT 1.5 CAL)  1,000 mL Per Tube Q24H  . feeding supplement (PROSource TF)  90 mL Per Tube 6 X Daily  . insulin aspart  0-20 Units Subcutaneous Q4H  . insulin aspart  6 Units Subcutaneous Q4H  . insulin detemir  15 Units Subcutaneous BID  . mouth rinse  15 mL Mouth Rinse 10 times per day  . multivitamin with minerals  1 tablet Oral Daily  . pantoprazole (PROTONIX) IV  40 mg Intravenous Daily  . polyethylene glycol  17 g Per Tube Daily  . predniSONE  50 mg Per Tube Q breakfast  . sodium chloride flush  10-40 mL Intracatheter Q12H  . sodium chloride flush  3 mL Intravenous Q12H  . zinc sulfate  220 mg Per Tube Daily    Continuous Infusions: .  prismasol BGK 4/2.5 200 mL/hr at 08/06/20 0113  .  prismasol BGK 4/2.5 400 mL/hr at 08/07/20 0418  . sodium chloride Stopped (08/01/20 1620)  . cisatracurium (NIMBEX) infusion 2 mcg/kg/min (08/07/20 1500)  . fentaNYL infusion INTRAVENOUS 300 mcg/hr (08/07/20 1500)  . heparin 1,950 Units/hr (08/07/20 1500)  .  norepinephrine (LEVOPHED) Adult infusion 12 mcg/min (08/07/20 1500)  . prismasol BGK 4/2.5 1,800 mL/hr at 08/07/20 1512  . propofol (DIPRIVAN) infusion 50 mcg/kg/min (08/07/20 1512)    PRN Meds: sodium chloride, acetaminophen, alteplase, fentaNYL, heparin, midazolam, ondansetron **OR** ondansetron (ZOFRAN) IV, sennosides, sodium chloride  Physical Exam         Limited exam today in the setting of COVID-19 pandemic, conservation of PPE.  Patient was  seen and examined in detail at the time of initial consultation on 8-17 Noted to be sedated intubated and mechanically ventilated, has HD catheter,   medication history reviewed. Vital Signs: BP 133/66 (BP Location: Right Arm)   Pulse 72   Temp 99 F (37.2 C)   Resp (!) 34   Ht 5' 9"  (1.753 m)   Wt 102.8 kg   SpO2 97%   BMI 33.47 kg/m  SpO2: SpO2: 97 % O2 Device: O2 Device: Ventilator O2 Flow Rate: O2 Flow Rate (L/min): 45 L/min  Intake/output summary:   Intake/Output Summary (Last 24 hours) at 08/07/2020 1612 Last data filed at 08/07/2020 1500 Gross per 24 hour  Intake 3918.15 ml  Output 4956 ml  Net -1037.85 ml   LBM: Last BM Date: 08/06/20 Baseline Weight: Weight: (!) 108.9 kg Most recent weight: Weight: 102.8 kg       Palliative Assessment/Data:      Patient Active Problem List   Diagnosis Date Noted  . Shock circulatory ([place]) 08/02/2020  . AKI (acute kidney injury) ([place])   . Acute hypoxemic respiratory failure due to COVID-19 [facility]) 08/02/2020    Palliative Care Assessment & Plan   Patient Profile:    Assessment: Acute hypoxic respiratory failure due to COVID-19 pneumonia Remains intubated and mechanically ventilated Remains on renal replacement therapy Being treated for presumed pulmonary embolism History of hypertension and diabetes  Recommendations/Plan:  Call placed and discussed again with brother Herbie Baltimore. We debriefed from our family meeting yesterday. Overall, Herbie Baltimore says that his mother and himself  and his siblings felt that the meeting was very helpful. They are aware completely of the serious nature of the patient's illness and of the high likelihood for ongoing decline and decompensation inspite of best interventions and therapies. However, patient's mother, Herbie Baltimore states "took it the hardest" and is not yet ready to consider decisions such as DNR and de escalation of interventions and is not ready to consider comfort care at this juncture. He asks for more space and time so that the family can come together for a unanimous decision and support each other. Herbie Baltimore says that he will correspond with the ICU directly with any further questions that he may have.  PMT will follow hospital course peripherally and further address goals of care when deemed necessary by the family, to allow for some space and reflection at this time.      Goals of Care and Additional Recommendations:  Limitations on Scope of Treatment: Full Scope Treatment  Code Status:    Code Status Orders  (From admission, onward)         Start     Ordered   08/01/20 1851  Full code  Continuous        08/01/20 1851        Code Status History    Date Active Date Inactive Code Status Order ID Comments User Context   08/01/2020 1623 08/01/2020 1851 Partial Code [id number]  [name], MD Inpatient   07/29/2020 1626 08/01/2020 1623 Full Code [id number]  Rigoberto Noel, MD ED   Advance Care Planning Activity       Prognosis:   Unable to determine  Discharge Planning:  To Be Determined  Care plan was discussed with bedside RN, brother Herbie Baltimore on the phone.      Thank you for allowing the Palliative Medicine Team to assist in the care of this patient.   Time In: 1530 Time Out: 1605 Total Time 35 Prolonged Time Billed  no       Greater than 50%  of this time was spent counseling and coordinating care related to the above assessment and plan.  Loistine Chance, MD  Please contact Palliative Medicine Team phone at  [phone] for questions and concerns.

## 2020-08-08 ENCOUNTER — Inpatient Hospital Stay (HOSPITAL_COMMUNITY): Payer: Medicaid Other

## 2020-08-08 LAB — GLUCOSE, CAPILLARY
Glucose-Capillary: 140 mg/dL — ABNORMAL HIGH (ref 70–99)
Glucose-Capillary: 124 mg/dL — ABNORMAL HIGH (ref 70–99)
Glucose-Capillary: 112 mg/dL — ABNORMAL HIGH (ref 70–99)
Glucose-Capillary: 164 mg/dL — ABNORMAL HIGH (ref 70–99)
Glucose-Capillary: 84 mg/dL (ref 70–99)
Glucose-Capillary: 106 mg/dL — ABNORMAL HIGH (ref 70–99)

## 2020-08-08 LAB — RENAL FUNCTION PANEL
Albumin: 2.1 g/dL — ABNORMAL LOW (ref 3.5–5.0)
Albumin: 2.2 g/dL — ABNORMAL LOW (ref 3.5–5.0)
Anion gap: 9 (ref 5–15)
Anion gap: 13 (ref 5–15)
BUN: 67 mg/dL — ABNORMAL HIGH (ref 8–23)
BUN: 68 mg/dL — ABNORMAL HIGH (ref 8–23)
CO2: 22 mmol/L (ref 22–32)
CO2: 20 mmol/L — ABNORMAL LOW (ref 22–32)
Calcium: 8.5 mg/dL — ABNORMAL LOW (ref 8.9–10.3)
Calcium: 8.4 mg/dL — ABNORMAL LOW (ref 8.9–10.3)
Chloride: 101 mmol/L (ref 98–111)
Chloride: 101 mmol/L (ref 98–111)
Creatinine, Ser: 2.13 mg/dL — ABNORMAL HIGH (ref 0.61–1.24)
Creatinine, Ser: 2.17 mg/dL — ABNORMAL HIGH (ref 0.61–1.24)
GFR calc Af Amer: 37 mL/min — ABNORMAL LOW (ref >60)
GFR calc Af Amer: 36 mL/min — ABNORMAL LOW (ref >60)
GFR calc non Af Amer: 32 mL/min — ABNORMAL LOW (ref >60)
GFR calc non Af Amer: 31 mL/min — ABNORMAL LOW (ref >60)
Glucose, Bld: 162 mg/dL — ABNORMAL HIGH (ref 70–99)
Glucose, Bld: 174 mg/dL — ABNORMAL HIGH (ref 70–99)
Phosphorus: 4.7 mg/dL — ABNORMAL HIGH (ref 2.5–4.6)
Phosphorus: 4.7 mg/dL — ABNORMAL HIGH (ref 2.5–4.6)
Potassium: 5.1 mmol/L (ref 3.5–5.1)
Potassium: 5.4 mmol/L — ABNORMAL HIGH (ref 3.5–5.1)
Sodium: 132 mmol/L — ABNORMAL LOW (ref 135–145)
Sodium: 134 mmol/L — ABNORMAL LOW (ref 135–145)

## 2020-08-08 LAB — CBC
HCT: 28.6 % — ABNORMAL LOW (ref 39.0–52.0)
Hemoglobin: 8.6 g/dL — ABNORMAL LOW (ref 13.0–17.0)
MCH: 28.5 pg (ref 26.0–34.0)
MCHC: 30.1 g/dL (ref 30.0–36.0)
MCV: 94.7 fL (ref 80.0–100.0)
Platelets: 98 10*3/uL — ABNORMAL LOW (ref 150–400)
RBC: 3.02 MIL/uL — ABNORMAL LOW (ref 4.22–5.81)
RDW: 18.6 % — ABNORMAL HIGH (ref 11.5–15.5)
WBC: 9.4 10*3/uL (ref 4.0–10.5)
nRBC: 1 % — ABNORMAL HIGH (ref 0.0–0.2)

## 2020-08-08 LAB — BLOOD GAS, ARTERIAL
Acid-base deficit: 3.1 mmol/L — ABNORMAL HIGH (ref 0.0–2.0)
Bicarbonate: 23.3 mmol/L (ref 20.0–28.0)
FIO2: 60
O2 Saturation: 84.3 %
Patient temperature: 97.5
pCO2 arterial: 50.3 mmHg — ABNORMAL HIGH (ref 32.0–48.0)
pH, Arterial: 7.284 — ABNORMAL LOW (ref 7.350–7.450)
pO2, Arterial: 53.1 mmHg — ABNORMAL LOW (ref 83.0–108.0)

## 2020-08-08 LAB — BASIC METABOLIC PANEL
Anion gap: 9 (ref 5–15)
BUN: 63 mg/dL — ABNORMAL HIGH (ref 8–23)
CO2: 22 mmol/L (ref 22–32)
Calcium: 8.6 mg/dL — ABNORMAL LOW (ref 8.9–10.3)
Chloride: 101 mmol/L (ref 98–111)
Creatinine, Ser: 2.05 mg/dL — ABNORMAL HIGH (ref 0.61–1.24)
GFR calc Af Amer: 39 mL/min — ABNORMAL LOW (ref >60)
GFR calc non Af Amer: 33 mL/min — ABNORMAL LOW (ref >60)
Glucose, Bld: 162 mg/dL — ABNORMAL HIGH (ref 70–99)
Potassium: 5.1 mmol/L (ref 3.5–5.1)
Sodium: 132 mmol/L — ABNORMAL LOW (ref 135–145)

## 2020-08-08 LAB — CALCIUM, IONIZED: Calcium, Ionized, Serum: 4.3 mg/dL — ABNORMAL LOW (ref 4.5–5.6)

## 2020-08-08 LAB — HEPARIN LEVEL (UNFRACTIONATED): Heparin Unfractionated: 0.38 IU/mL (ref 0.30–0.70)

## 2020-08-08 LAB — MAGNESIUM: Magnesium: 3 mg/dL — ABNORMAL HIGH (ref 1.7–2.4)

## 2020-08-08 LAB — APTT: aPTT: 85 seconds — ABNORMAL HIGH (ref 24–36)

## 2020-08-08 LAB — PHOSPHORUS: Phosphorus: 4.7 mg/dL — ABNORMAL HIGH (ref 2.5–4.6)

## 2020-08-08 MED ORDER — PRISMASOL BGK 0/2.5 32-2.5 MEQ/L IV SOLN
INTRAVENOUS | Status: DC
  Filled 2020-08-08 (×5): qty 5000

## 2020-08-08 NOTE — Progress Notes (Signed)
ANTICOAGULATION CONSULT NOTE - Follow Up Consult  Pharmacy Consult for Heparin Indication: acute DVT, presumed PE  No Known Allergies  Patient Measurements: Height: 5\' 9"  (175.3 cm) Weight: 102.8 kg (226 lb 10.5 oz) IBW/kg (Calculated) : 70.7 Heparin Dosing Weight: 95 kg  Vital Signs: Temp: 97.9 F (36.6 C) (08/20 0030) Temp Source: Bladder (08/19 2000) BP: 144/74 (08/20 0000) Pulse Rate: 69 (08/20 0030)  Labs: Recent Labs    08/05/20 0400 08/05/20 1552 08/06/20 0205 08/06/20 0205 08/06/20 1611 08/07/20 0413 08/07/20 1620 08/08/20 0006  HGB 8.8*  --  8.8*  --   --  9.6*  --   --   HCT 28.1*  --  28.0*  --   --  32.6*  --   --   PLT 111*  --  101*  --   --  105*  --   --   APTT 66*  --  77*  --   --  61*  --   --   HEPARINUNFRC 0.37  --  0.33   < >  --  0.28* 0.46 0.38  CREATININE 2.17*   < > 2.05*  2.11*   < > 2.22* 1.98* 2.13*  --    < > = values in this interval not displayed.    Estimated Creatinine Clearance: 41.9 mL/min (A) (by C-G formula based on SCr of 2.13 mg/dL (H)).  Assessment: 64 yo male with COVID PNA, now on CRRT, D-dimer rose from 7 to 17 to start IV heparin per Rx dosing for acute R DVT peroneal vein.   Significant Events: -8/6: Pt had bradycardia, hypotension. Pt given alteplase 50 mg IV infusion over 2 hrs for PE @ 16:08 -8/6: Heparin infusion resumed @ 21:41 s/p TPA with aPTT <80  Today, 08/08/20  Heparin level = 0.38 units/mL therapeutic x 2 with heparin infusing @ 1950 units/hr   CBC: Hgb low/improved to 9.6, Pltc low but relatively stable at 105K.   No bleeding or infusion issues noted  Remains on CRRT (last clotted 8/16 AM)  Goal of Therapy:  Heparin level 0.3 - 0.7 units/ml Monitor platelets by anticoagulation protocol: Yes   Plan:   Continue heparin infusion at 1950 units/hr  Daily heparin level and CBC while on heparin  Monitor for signs/symptoms of bleeding    Leone Haven, PharmD 08/08/2020 12:49 AM

## 2020-08-08 NOTE — Progress Notes (Signed)
Given patient's prolonged intractable severe respiratory failure, renal failure, and encephalopathy, if he deteriorates to the point of cardiac arrest, CPR and shocks will not prolong this patient's life and will only cause pain and suffering.  Will ask Dr. Lake Bells to review case and if he agrees, patient is DNR.  Erskine Emery MD PCCM

## 2020-08-08 NOTE — Progress Notes (Signed)
[place] pulmonary [facility] critical care medicine COVID lead physician for [place]  I was asked to review Alex [name]'s chart.  I am familiar with his care as I intubated him not long after arrival and provided critical care services for him early in his hospitalization.  He has made little progress since early July when he was admitted to the hospital and remains mechanically ventilated, dependent on CRRT, and requiring high doses of sedation as well as paralytics.  Given his comorbid illnesses and duration of illness likelihood of survival is very low.  If he were to have a cardiac arrest the intervention of CPR (chest compressions, electric shocks) would give him unnecessary pain and suffering and he would not survive to hospital discharge.  I recommend a DNR CODE STATUS.  Agree with ongoing conversations with family in regards to consideration overall goals of care and consideration of withdrawal of care.  [name], MD [place] PCCM Pager: [phone] Cell: [phone] If no response, call [phone]

## 2020-08-08 NOTE — Progress Notes (Addendum)
[facility] Progress Note  Subjective: 2 physician DNR now. Currently off levo. No issues with crrt. fio2 now 80%, peep still 14. cvp's 4-5.  Vitals:   08/08/20 0900 08/08/20 1000 08/08/20 1100 08/08/20 1200  BP:    105/60  Pulse: 92 91 92 90  Resp: 18 18 18  (!) 31  Temp: 99 F (37.2 C) 99.1 F (37.3 C) 99.5 F (37.5 C) 99.7 F (37.6 C)  TempSrc:      SpO2: (!) 86% (!) 89% (!) 89% (!) 89%  Weight:      Height:        Physical Exam: BP 105/60   Pulse 90   Temp 99.7 F (37.6 C)   Resp (!) 31   Ht 5' 9"  (1.753 m)   Wt 102.8 kg   SpO2 (!) 89%   BMI 33.47 kg/m  Limited exam in the setting of the COVID-19 pandemic and to conserve PPE Gen: ill appearing, sedated CV: tachycardic Resp: intubated, bl chest rise Neuro: sedated/paralyzed    CXR 8/8 > Cardiac enlargement with bilateral perihilar and basilar airspace infiltrates, likely edema.   CXR 8/12 - better overall w/ dec'd infiltrates   Renal US 8/2 - 13-14 cm kidneys w/o hydro, +^'d echo seen w/ CKD   UA 8/2 - >300 prot, 20-50 rbc, 6-10 wbc, 0-5 epi   UP/C ratio = 3.3    UNa 32, UCr 147 08/04/2020 cxr: lower lung volumes with progression in bilateral heterogeneous lung opacities   Recent Labs  Lab 08/07/20 0413 08/07/20 0413 08/07/20 1620 08/08/20 0208  K 4.9   < > 6.1* 5.1  5.1  BUN 65*   < > 60* 67*  63*  CREATININE 1.98*   < > 2.13* 2.13*  2.05*  CALCIUM 8.6*   < > 8.3* 8.5*  8.6*  PHOS 6.3*   < > 6.6* 4.7*  4.7*  HGB 9.6*  --   --  8.6*   < > = values in this interval not displayed.   Inpatient medications: . artificial tears  1 application Both Eyes V7O  . vitamin C  500 mg Per Tube Daily  . chlorhexidine gluconate (MEDLINE KIT)  15 mL Mouth Rinse BID  . Chlorhexidine Gluconate Cloth  6 each Topical Daily  . docusate  100 mg Per Tube BID  . feeding supplement (PIVOT 1.5 CAL)  1,000 mL Per Tube Q24H  . feeding supplement (PROSource TF)  90 mL Per Tube 6 X Daily  . insulin aspart   0-20 Units Subcutaneous Q4H  . insulin aspart  6 Units Subcutaneous Q4H  . insulin detemir  15 Units Subcutaneous BID  . mouth rinse  15 mL Mouth Rinse 10 times per day  . multivitamin with minerals  1 tablet Oral Daily  . pantoprazole (PROTONIX) IV  40 mg Intravenous Daily  . polyethylene glycol  17 g Per Tube Daily  . predniSONE  50 mg Per Tube Q breakfast  . sodium chloride flush  10-40 mL Intracatheter Q12H  . sodium chloride flush  3 mL Intravenous Q12H  . zinc sulfate  220 mg Per Tube Daily   .  prismasol BGK 4/2.5 200 mL/hr at 08/08/20 0330  .  prismasol BGK 4/2.5 400 mL/hr at 08/08/20 0604  . sodium chloride Stopped (08/01/20 1620)  . cisatracurium (NIMBEX) infusion Stopped (08/07/20 1621)  . fentaNYL infusion INTRAVENOUS 300 mcg/hr (08/08/20 1300)  . heparin 1,950 Units/hr (08/08/20 1300)  . norepinephrine (LEVOPHED) Adult infusion Stopped (08/08/20  1141)  . prismasol BGK 4/2.5 1,800 mL/hr at 08/08/20 1256  . propofol (DIPRIVAN) infusion 55 mcg/kg/min (08/08/20 1300)   sodium chloride, acetaminophen, alteplase, fentaNYL, heparin, midazolam, ondansetron **OR** ondansetron (ZOFRAN) IV, sennosides, sodium chloride      Assessment/ Plan: 1. Renal failure - no old data, hx of "kidney disease" per patient at time of admission. Creat 8.4 on admit 8/2, +proteinuria 3 gm and microhematuria on admit. Renal US normal size kidneys w/ ^'d echo seen w/ chronic disease.  CRRT started 8/3.  Worsening CXR 8/16 and net positive >5L. Will aim to keep him net even for now to neg 50 cc/hr for now. Using all BGK 4/2.5 fluids right now, change to 2k/2.5cal post filter. No hep in circuit, on systemic IV heparin.  No changes today 1. At this junction, patient is not an outpatient dialysis candidate and I do suspect he will have renal recovery at this junction especially in the setting of him not making much progress since admission. Overall, I agree that comfort measures would be the next best step in  the setting of ongoing family discussions. 2. COVID PNA - w/ vent dependence, sp IV steroids/ remdesivir, on pred 48m daily. Poor prognosis overall per primary. Appreciate help with palliative care 3. Hypotension/ shock - stable, as above. On/off Levo. 4. Anemia - Hb 8.8 5. Suspected acute PE w/ cor pulmonale - sp systemic TPA on 8/6 and on getting full dose IV systemic heparin 6. Acute RLE DVT - as above 7. DM/ hyperglycemia - on SQ insulin per CCM 8. Dispo: appreciate palliative care   VGean Quint MD [facility]

## 2020-08-08 NOTE — Progress Notes (Addendum)
NAME:  SHELTON SQUARE, MRN:  [id number], DOB:  Feb 15, 1956, LOS: 37 ADMISSION DATE:  08/05/2020, CONSULTATION DATE:  8/1 REFERRING MD:  Sedonia Small, CHIEF COMPLAINT:  Dyspnea   Brief History   64 y/o male with acute hypoxemic respiratory failure due to COVID 19 pneumonia admitted on 8/1  Past Medical History  Cigarette smoker DM2 Hypertension CKD?  Significant Hospital Events   8/1 admission 8/2 intubation 8/3 started CRRT 8/5 significant improvement in oxygenation; mid morning developed severe sudden shock of uncertain etiology, low grade temp 8/6 Given systemic TPA due to repeated episodes of shock and cardiac instability and concern for PE 8/11 still significantly hypoxemic, requiring intermittent paralytics.  Resumed proning 8/13 stopped proning as P/F ratio > 150 8/15 stopped amio due to low heart rate 8/18 unable to wean paralytics as patient becomes dyssynchronous, Family meeting with palliative care.   Consults:  Nephrology  Procedures:  8/2 ETT >  8/2 L IJ CVL >  8/3 R IJ HD cath >   Significant Diagnostic Tests:  8/2 lower ext doppler/vascular ultrasound >> acute R DVT peroneal vein, left negative 8/2 renal ultrasound >> no hydro, findings consistent with medicorenal disease  Micro Data:  8/1 SARS COV 2 > positive 8/1 blood -negative 8/1  MRSA  PCR  Neg  8/5 blood cultures x 2 > neg    Antimicrobials/COVID Rx:  8/1 ceftriaxone > 8/5-8/7 8/1 remdesivir -completed 8/5 8/1 tocilizumab    Interim history/subjective:   No events, off paralytics but de-recruits with sedation wean.  Objective   Blood pressure 138/73, pulse 79, temperature 98.1 F (36.7 C), resp. rate (!) 35, height 5\' 9"  (1.753 m), weight 102.8 kg, SpO2 95 %. CVP:  [1 mmHg-8 mmHg] 4 mmHg  Vent Mode: PRVC FiO2 (%):  [60 %-100 %] 60 % Set Rate:  [34 bmp] 34 bmp Vt Set:  [560 mL] 560 mL PEEP:  [14 cmH20] 14 cmH20 Plateau Pressure:  [43 cmH20-46 cmH20] 45 cmH20   Intake/Output Summary (Last 24  hours) at 08/08/2020 0700 Last data filed at 08/08/2020 0600 Gross per 24 hour  Intake 3660.2 ml  Output 3796 ml  Net -135.8 ml   Filed Weights   08/04/20 0411 08/05/20 0422 08/06/20 0243  Weight: 102.8 kg 102.8 kg 102.8 kg   CVP:  [1 mmHg-8 mmHg] 4 mmHg   Examination: GEN: ill appearing man in NAD HEENT: ETT in place, large amount of thick secretions CV: RRR, ext warm PULM: Rhonci bilaterally, +accessory muscle use GI: Soft, hypoactive BS EXT: No edema NEURO: Withdraws x 4 PSYCH: RASS -4 SKIN: no rashes   ABG with high A-A gradient, resp insufficiency BMP looks fine on CRRT CXR stable CBC hgb/plts dropping slowly  Resolved Hospital Problem list    Assessment & Plan:  ARDS due to COVID 19 pneumonia > worsening oxygenation on 8/6 Acute pulmonary edema due to acute renal failure Likely acute massive PE with acute cor pulmonale-> s/p systemic TPA on 8/6. Continue low tidal volume ventilation Attempt daily to wean paralytics but so far have been unsuccessful due to vent dyssynchrony Continue prednisone Anticoagulation for known DVT and presumed PE.  Severe shock   Periodic bradycardia> no clear source, possibly due to hypothermia vs. Vagal effect of coughing - Wean pressors as able  Acute DVT R leg Continue anticoagulation  AKI with hyperkalemia BPH Metabolic acidosis-stable Continue CRRT   Fluid removal as able  Hyperglycemia Levemir,SSI, tube feed coverage, CBG look fine today  Need for sedation for  mechanical ventilation - Target vent synchrony and prevent desats more than anything  PAF on amio -Off amnio 8/15  Goals of care Family remains uncertain and unwilling to make decisions Patient is 18 days on vent Not sure if he is even a candidate for continued CRRT after discussion with nephrology Spoke with family, they want to wait until next week to make any decsions.  Best practice:  Diet: tube feeds Pain/Anxiety/Delirium protocol (if indicated): On  fentanyl, propofol VAP protocol (if indicated): Per protocol DVT prophylaxis: heparin infusion GI prophylaxis: Protonix Glucose control: Insulin as above Mobility: bed rest Code Status: full Family Communication: spoke with brother Disposition: remain in ICU   The patient is critically ill with multiple organ systems failure and requires high complexity decision making for assessment and support, frequent evaluation and titration of therapies, application of advanced monitoring technologies and extensive interpretation of multiple databases. Critical Care Time devoted to patient care services described in this note independent of APP/resident time (if applicable)  is 38 minutes.   Erskine Emery MD [facility] 08/08/2020 7:15 AM Personal pager: [phone] If unanswered, please page CCM On-call: [phone]

## 2020-08-08 NOTE — Progress Notes (Signed)
Attempted to wean sedation, Fentanyl dropped from 318mcg to 247mcg, pt unable to tolerate, dyssynchronous with the vent and SPO2 dropped as low as 75%. Pt also had significant mucus plug, suctioned inline and able to get most of the mucus plug out, RT also came to bedside and per pt ABG and consistent SPO2 of 88-89, increased Fio2 to 70%. Fentanyl back at 332mcg. Vitals stable at this time.

## 2020-08-08 NOTE — TOC Progression Note (Signed)
Transition of Care [facility]) - Progression Note    Patient Details  Name: Alex [name] MRN: [id number] Date of Birth: [birth year]  [facility] CM/SW Contact  Leeroy Cha, RN Phone Number: 08/08/2020, 7:50 AM  Clinical Narrative:    Remains on the vent and a.lines in place, crrt continues,iv sedation, heparin and levophed.  Family still wants aggressive care but does understand the futility of status.  Following for progression, family needs and toc needs.   Expected Discharge Plan: Home/Self Care Barriers to Discharge: Continued Medical Work up  Expected Discharge Plan and Services Expected Discharge Plan: Home/Self Care       Living arrangements for the past 2 months: Single Family Home                                       Social Determinants of Health (SDOH) Interventions    Readmission Risk Interventions No flowsheet data found.

## 2020-08-09 LAB — CBC
HCT: 28.1 % — ABNORMAL LOW (ref 39.0–52.0)
Hemoglobin: 8.5 g/dL — ABNORMAL LOW (ref 13.0–17.0)
MCH: 28.2 pg (ref 26.0–34.0)
MCHC: 30.2 g/dL (ref 30.0–36.0)
MCV: 93.4 fL (ref 80.0–100.0)
Platelets: 98 10*3/uL — ABNORMAL LOW (ref 150–400)
RBC: 3.01 MIL/uL — ABNORMAL LOW (ref 4.22–5.81)
RDW: 18.5 % — ABNORMAL HIGH (ref 11.5–15.5)
WBC: 7.7 10*3/uL (ref 4.0–10.5)
nRBC: 0.9 % — ABNORMAL HIGH (ref 0.0–0.2)

## 2020-08-09 LAB — RENAL FUNCTION PANEL
Albumin: 2.2 g/dL — ABNORMAL LOW (ref 3.5–5.0)
Albumin: 2 g/dL — ABNORMAL LOW (ref 3.5–5.0)
Anion gap: 13 (ref 5–15)
Anion gap: 10 (ref 5–15)
BUN: 60 mg/dL — ABNORMAL HIGH (ref 8–23)
BUN: 61 mg/dL — ABNORMAL HIGH (ref 8–23)
CO2: 23 mmol/L (ref 22–32)
CO2: 22 mmol/L (ref 22–32)
Calcium: 8.9 mg/dL (ref 8.9–10.3)
Calcium: 8.3 mg/dL — ABNORMAL LOW (ref 8.9–10.3)
Chloride: 102 mmol/L (ref 98–111)
Chloride: 100 mmol/L (ref 98–111)
Creatinine, Ser: 1.98 mg/dL — ABNORMAL HIGH (ref 0.61–1.24)
Creatinine, Ser: 1.88 mg/dL — ABNORMAL HIGH (ref 0.61–1.24)
GFR calc Af Amer: 40 mL/min — ABNORMAL LOW
GFR calc Af Amer: 43 mL/min — ABNORMAL LOW (ref >60)
GFR calc non Af Amer: 35 mL/min — ABNORMAL LOW
GFR calc non Af Amer: 37 mL/min — ABNORMAL LOW (ref >60)
Glucose, Bld: 125 mg/dL — ABNORMAL HIGH (ref 70–99)
Glucose, Bld: 166 mg/dL — ABNORMAL HIGH (ref 70–99)
Phosphorus: 3.9 mg/dL (ref 2.5–4.6)
Phosphorus: 4.7 mg/dL — ABNORMAL HIGH (ref 2.5–4.6)
Potassium: 4.7 mmol/L (ref 3.5–5.1)
Potassium: 5.1 mmol/L (ref 3.5–5.1)
Sodium: 138 mmol/L (ref 135–145)
Sodium: 132 mmol/L — ABNORMAL LOW (ref 135–145)

## 2020-08-09 LAB — HEPARIN LEVEL (UNFRACTIONATED)
Heparin Unfractionated: 0.15 IU/mL — ABNORMAL LOW (ref 0.30–0.70)
Heparin Unfractionated: 0.25 IU/mL — ABNORMAL LOW (ref 0.30–0.70)

## 2020-08-09 LAB — GLUCOSE, CAPILLARY
Glucose-Capillary: 116 mg/dL — ABNORMAL HIGH (ref 70–99)
Glucose-Capillary: 149 mg/dL — ABNORMAL HIGH (ref 70–99)
Glucose-Capillary: 149 mg/dL — ABNORMAL HIGH (ref 70–99)
Glucose-Capillary: 159 mg/dL — ABNORMAL HIGH (ref 70–99)
Glucose-Capillary: 110 mg/dL — ABNORMAL HIGH (ref 70–99)
Glucose-Capillary: 118 mg/dL — ABNORMAL HIGH (ref 70–99)

## 2020-08-09 LAB — APTT: aPTT: 49 seconds — ABNORMAL HIGH (ref 24–36)

## 2020-08-09 LAB — TRIGLYCERIDES: Triglycerides: 174 mg/dL — ABNORMAL HIGH (ref <150)

## 2020-08-09 LAB — MAGNESIUM: Magnesium: 3 mg/dL — ABNORMAL HIGH (ref 1.7–2.4)

## 2020-08-09 LAB — CALCIUM, IONIZED: Calcium, Ionized, Serum: 4.4 mg/dL — ABNORMAL LOW (ref 4.5–5.6)

## 2020-08-09 MED ORDER — PREDNISONE 20 MG PO TABS
30.0000 mg | ORAL_TABLET | Freq: Every day | ORAL | Status: DC
  Administered 2020-08-10: 30 mg
  Filled 2020-08-09: qty 1

## 2020-08-09 MED ORDER — PANTOPRAZOLE SODIUM 40 MG PO PACK
40.0000 mg | PACK | ORAL | Status: DC
  Administered 2020-08-09 – 2020-08-10 (×2): 40 mg
  Filled 2020-08-09 (×2): qty 20

## 2020-08-09 MED ORDER — SODIUM CHLORIDE 0.9 % IV BOLUS: 1000.0000 mL | Freq: Once | INTRAVENOUS | Status: DC

## 2020-08-09 MED ORDER — HEPARIN (PORCINE) 25000 UT/250ML-% IV SOLN
2350.0000 [IU]/h | INTRAVENOUS | Status: DC
  Administered 2020-08-10: 2350 [IU]/h via INTRAVENOUS
  Filled 2020-08-09 (×2): qty 250

## 2020-08-09 MED ORDER — ADULT MULTIVITAMIN W/MINERALS CH
1.0000 | ORAL_TABLET | Freq: Every day | ORAL | Status: DC
  Administered 2020-08-09 – 2020-08-10 (×2): 1
  Filled 2020-08-09: qty 1

## 2020-08-09 NOTE — Progress Notes (Signed)
eLink Physician-Brief Progress Note Patient Name: Alex [name] DOB: 1956/09/12 MRN: [id number]   Date of Service  08/09/2020  HPI/Events of Note  Hypotension - BP = 79/42. CVP =  And Hgb = 8.5.   eICU Interventions  Plan: 1. Increase Fentanyl IV infusion ceiling to 400 mg/hour. Titrate to RASS = 0 to -1. 2. Wean Propofol IV infusion as tolerated.  3. Norepinephrine IV infusion restarted. Titrate to MAP >= 65.  4. Decrease fluid removal on CRRT until BP stabilized.  5 Monitor CVP now and Q 4 hours.       Intervention Category Major Interventions: Hypotension - evaluation and management  Amariya Liskey Cornelia Copa 08/09/2020, 5:51 AM

## 2020-08-09 NOTE — Progress Notes (Signed)
NAME:  Alex [name], MRN:  [id number], DOB:  11/01/1956, LOS: 21 ADMISSION DATE:  08/09/2020, CONSULTATION DATE:  8/1 REFERRING MD:  Sedonia Small, CHIEF COMPLAINT:  Dyspnea   Brief History   64 y/o male smoker with acute hypoxemic respiratory failure due to COVID 19 pneumonia admitted on 8/1.  Past Medical History  Cigarette smoker DM2 Hypertension CKD?  Significant Hospital Events   8/1 admission 8/2 intubation 8/3 started CRRT 8/5 significant improvement in oxygenation; mid morning developed severe sudden shock of uncertain etiology, low grade temp 8/6 Given systemic TPA due to repeated episodes of shock and cardiac instability and concern for PE 8/11 still significantly hypoxemic, requiring intermittent paralytics.  Resumed proning 8/13 stopped proning as P/F ratio > 150 8/15 stopped amio due to low heart rate 8/18 unable to wean paralytics as patient becomes dyssynchronous, Family meeting with palliative care.  Consults:  Nephrology  Procedures:  8/2 ETT >  8/2 L IJ CVL >  8/3 R IJ HD cath >   Significant Diagnostic Tests:  8/2 lower ext doppler/vascular ultrasound >> acute R DVT peroneal vein, left negative 8/2 renal ultrasound >> no hydro, findings consistent with medicorenal disease 8/4 Echo >> EF 60 to 65%, mild LVH 8/5 CT abd/pelvis >> retroperitoneal soft tissue stranding  8/5 CT head >> chronic small vessel ischemia  Micro Data:  8/1 SARS COV 2 > positive 8/1 blood -negative 8/1  MRSA  PCR  Neg  8/5 blood cultures x 2 > neg    Antimicrobials/COVID Rx:  8/1 ceftriaxone > 8/5-8/7 8/1 remdesivir -completed 8/5 8/1 tocilizumab   Interim history/subjective:  Remains on CRRT, vent, sedation.  Objective:  Blood pressure 114/69, pulse 80, temperature 98.4 F (36.9 C), resp. rate (!) 34, height 5\' 9"  (1.753 m), weight 102.8 kg, SpO2 97 %. CVP:  [1 mmHg-83 mmHg] 7 mmHg  Vent Mode: PRVC FiO2 (%):  [80 %] 80 % Set Rate:  [34 bmp] 34 bmp Vt Set:  [560 mL] 560  mL PEEP:  [14 cmH20] 14 cmH20 Plateau Pressure:  [32 cmH20-47 cmH20] 41 cmH20   Intake/Output Summary (Last 24 hours) at 08/09/2020 0935 Last data filed at 08/09/2020 0900 Gross per 24 hour  Intake 3598.88 ml  Output 3356 ml  Net 242.88 ml   Filed Weights   08/04/20 0411 08/05/20 0422 08/06/20 0243  Weight: 102.8 kg 102.8 kg 102.8 kg   CVP:  [1 mmHg-83 mmHg] 7 mmHg   Examination:  General - sedated Eyes - pupils reactive ENT - ETT in place Cardiac - regular rate/rhythm, no murmur Chest - b/l rhonchi Abdomen - soft, non tender, + bowel sounds Extremities - 1+ edema Skin - no rashes Neuro - RASS -2   Resolved Hospital Problem list   Bradycardia, Hyperkalemia, Paroxysmal atrial fibrillation, Anion gap metabolic acidosis  Assessment & Plan:   Acute hypoxic, hypercapnic respiratory failure with ARDS from COVID 19 pneumonia and acute pulmonary edema. - goal SpO2 88 to 95% - goal Vt 6 cc/kg - f/u CXR, ABG - prone positioning of PaO2:FiO2 < 150 - wean off prednisone  Rt leg DVT with presumed PE. - s/p thrombolytic on 8/06 - continue heparin gtt  Septic shock. - present on admission - goal MAP > 65  AKI from ATN 2nd to sepsis. - CRRT per renal  Steroid induced hyperglycemia. - SSI, levemir  Acute metabolic encephalopathy 2nd to sepsis, hypoxia. - RASS goal -1   Anemia of critical illness. Thrombocytopenia from sepsis. - f/u CBC  Best practice:  Diet: tube feeds DVT prophylaxis: heparin infusion GI prophylaxis: Protonix Mobility: bed rest Code Status: DNR Disposition: remain in ICU  Labs:   CMP Latest Ref Rng & Units 08/09/2020 08/08/2020 08/08/2020  Glucose 70 - 99 mg/dL 125(H) 174(H) 162(H)  BUN 8 - 23 mg/dL 60(H) 68(H) 67(H)  Creatinine 0.61 - 1.24 mg/dL 1.98(H) 2.17(H) 2.13(H)  Sodium 135 - 145 mmol/L 138 134(L) 132(L)  Potassium 3.5 - 5.1 mmol/L 4.7 5.4(H) 5.1  Chloride 98 - 111 mmol/L 102 101 101  CO2 22 - 32 mmol/L 23 20(L) 22  Calcium 8.9 -  10.3 mg/dL 8.9 8.4(L) 8.5(L)  Total Protein 6.5 - 8.1 g/dL - - -  Total Bilirubin 0.3 - 1.2 mg/dL - - -  Alkaline Phos 38 - 126 U/L - - -  AST 15 - 41 U/L - - -  ALT 0 - 44 U/L - - -    CBC Latest Ref Rng & Units 08/09/2020 08/08/2020 08/07/2020  WBC 4.0 - 10.5 K/uL 7.7 9.4 12.3(H)  Hemoglobin 13.0 - 17.0 g/dL 8.5(L) 8.6(L) 9.6(L)  Hematocrit 39 - 52 % 28.1(L) 28.6(L) 32.6(L)  Platelets 150 - 400 K/uL 98(L) 98(L) 105(L)   ABG    Component Value Date/Time   PHART 7.284 (L) 08/08/2020 0411   PCO2ART 50.3 (H) 08/08/2020 0411   PO2ART 53.1 (L) 08/08/2020 0411   HCO3 23.3 08/08/2020 0411   TCO2 24 08/02/2020 0811   ACIDBASEDEF 3.1 (H) 08/08/2020 0411   O2SAT 84.3 08/08/2020 0411    CBG (last 3)  Recent Labs    08/08/20 2317 08/09/20 0405 08/09/20 0802  GLUCAP 106* 116* 149*    Critical care time: 38 minutes  Chesley Mires, MD [place] Pager - [phone] 08/09/2020, 9:48 AM

## 2020-08-09 NOTE — Progress Notes (Signed)
Called brother and informed him of patient's DNR status.

## 2020-08-09 NOTE — Progress Notes (Signed)
SBP reading mid 80s with MAP around 45-50. Levo initiated at 20mcg/hr and fluid removal rate decreased to keep patient slightly positive. Shortly after levo started patient heart rate dropped to 55bpm and BP increased to 151/62. Levo stopped and heart rate slowly increased back to mid to low 80s. E-Link notified

## 2020-08-09 NOTE — Progress Notes (Signed)
[facility] Progress Note  Subjective: episode of bradycardia when starting levo/turning patient. Resolved. Anuric.  Vitals:   08/09/20 1000 08/09/20 1052 08/09/20 1100 08/09/20 1200  BP:    (!) 107/55  Pulse: 79  88 90  Resp: (!) 34  19 (!) 25  Temp: 98.4 F (36.9 C)  98.4 F (36.9 C) 98.4 F (36.9 C)  TempSrc:      SpO2: 97% 91% (!) 86% 90%  Weight:      Height:        Physical Exam: BP (!) 107/55 (BP Location: Right Arm)   Pulse 90   Temp 98.4 F (36.9 C)   Resp (!) 25   Ht 5' 9"  (1.753 m)   Wt 102.8 kg   SpO2 90%   BMI 33.47 kg/m  Limited exam in the setting of the COVID-19 pandemic and to conserve PPE Gen: ill appearing, sedated CV: tachycardic Resp: intubated, bl chest rise Neuro: sedated/paralyzed    CXR 8/8 > Cardiac enlargement with bilateral perihilar and basilar airspace infiltrates, likely edema.   CXR 8/12 - better overall w/ dec'd infiltrates   Renal US 8/2 - 13-14 cm kidneys w/o hydro, +^'d echo seen w/ CKD   UA 8/2 - >300 prot, 20-50 rbc, 6-10 wbc, 0-5 epi   UP/C ratio = 3.3    UNa 32, UCr 147 08/04/2020 cxr: lower lung volumes with progression in bilateral heterogeneous lung opacities   Recent Labs  Lab 08/08/20 0208 08/08/20 0208 08/08/20 1600 08/09/20 0300  K 5.1  5.1   < > 5.4* 4.7  BUN 67*  63*   < > 68* 60*  CREATININE 2.13*  2.05*   < > 2.17* 1.98*  CALCIUM 8.5*  8.6*   < > 8.4* 8.9  PHOS 4.7*  4.7*   < > 4.7* 3.9  HGB 8.6*  --   --  8.5*   < > = values in this interval not displayed.   Inpatient medications: . artificial tears  1 application Both Eyes B3U  . chlorhexidine gluconate (MEDLINE KIT)  15 mL Mouth Rinse BID  . Chlorhexidine Gluconate Cloth  6 each Topical Daily  . docusate  100 mg Per Tube BID  . feeding supplement (PIVOT 1.5 CAL)  1,000 mL Per Tube Q24H  . feeding supplement (PROSource TF)  90 mL Per Tube 6 X Daily  . insulin aspart  0-20 Units Subcutaneous Q4H  . insulin aspart  6 Units  Subcutaneous Q4H  . insulin detemir  15 Units Subcutaneous BID  . mouth rinse  15 mL Mouth Rinse 10 times per day  . multivitamin with minerals  1 tablet Per Tube Daily  . pantoprazole sodium  40 mg Per Tube Q24H  . polyethylene glycol  17 g Per Tube Daily  . [START ON 22-Aug-2020] predniSONE  30 mg Per Tube Q breakfast  . sodium chloride flush  10-40 mL Intracatheter Q12H  . sodium chloride flush  3 mL Intravenous Q12H   .  prismasol BGK 4/2.5 400 mL/hr at 08/09/20 0808  . sodium chloride Stopped (08/01/20 1620)  . cisatracurium (NIMBEX) infusion Stopped (08/07/20 1621)  . fentaNYL infusion INTRAVENOUS 350 mcg/hr (08/09/20 1200)  . heparin 2,250 Units/hr (08/09/20 1200)  . norepinephrine (LEVOPHED) Adult infusion Stopped (08/09/20 1150)  . prismasol BGK 2/2.5 replacement solution 300 mL/hr at 08/09/20 0935  . prismasol BGK 4/2.5 1,800 mL/hr at 08/09/20 1220  . propofol (DIPRIVAN) infusion 50 mcg/kg/min (08/09/20 1200)  . sodium chloride  sodium chloride, acetaminophen, alteplase, fentaNYL, heparin, midazolam, [DISCONTINUED] ondansetron **OR** ondansetron (ZOFRAN) IV, sennosides, sodium chloride      Assessment/ Plan: 1. Renal failure - no old data, hx of "kidney disease" per patient at time of admission. Creat 8.4 on admit 8/2, +proteinuria 3 gm and microhematuria on admit. Renal US normal size kidneys w/ ^'d echo seen w/ chronic disease.  CRRT started 8/3.  Worsening CXR 8/16 and net positive >5L. Will aim to keep him net even for now to neg 50 cc/hr for now. Using all BGK 4/2.5 fluids right now, changed to 2k/2.5cal post filter--K better now. No hep in circuit, on systemic IV heparin.  No changes today 1. At this junction, patient is not an outpatient dialysis candidate and I do suspect he will have renal recovery at this junction especially in the setting of him not making much progress since admission. Overall, I agree that comfort measures would be the next best step in the  setting of ongoing family discussions. 2. COVID PNA - w/ vent dependence, sp IV steroids/ remdesivir, on pred 51m daily. Poor prognosis overall per primary. Appreciate help with palliative care. Now 2 physician DNR  3. Hypotension/ shock - stable, as above. On/off Levo. 4. Anemia - Hb 8.8 5. Suspected acute PE w/ cor pulmonale - sp systemic TPA on 8/6 and on getting full dose IV systemic heparin 6. Acute RLE DVT - as above 7. DM/ hyperglycemia - on SQ insulin per CCM 8. Dispo: appreciate palliative care   VGean Quint MD [facility]

## 2020-08-09 NOTE — Progress Notes (Signed)
eLink Physician-Brief Progress Note Patient Name: Alex [name] DOB: 02/26/1956 MRN: [id number]   Date of Service  08/09/2020  HPI/Events of Note  CVP = 5.   eICU Interventions  Will bolus with 0.9 NaCl 1 liter IV over 1 hour now.      Intervention Category Major Interventions: Hypovolemia - evaluation and treatment with fluids  [name] Cornelia Copa 08/09/2020, 6:04 AM

## 2020-08-09 NOTE — Progress Notes (Signed)
Pharmacy Brief Note - Anticoagulation Follow Up:  Pt on heparin infusion for PE.  Assessment:  HL = 0.25 is slightly subtherapeutic on heparin infusion of 2250 units/hr  Confirmed with RN that heparin infusing at correct rate with no interruptions. No obvious signs of bleeding. CBC low but stable.  Goal: HL 0.3 - 0.7  Plan:  Increase heparin infusion to 2350 units/hr  Check HL in 8 hours  CBC, HL daily while on heparin infusion  Monitor for signs/symptoms of bleeding  Lenis Noon, PharmD 08/09/20 4:28 PM

## 2020-08-09 NOTE — Progress Notes (Signed)
ANTICOAGULATION CONSULT NOTE - Follow Up Consult  Pharmacy Consult for Heparin Indication: acute DVT, presumed PE  No Known Allergies  Patient Measurements: Height: 5\' 9"  (175.3 cm) Weight: 102.8 kg (226 lb 10.5 oz) IBW/kg (Calculated) : 70.7 Heparin Dosing Weight: 95 kg  Vital Signs: Temp: 98.4 F (36.9 C) (08/21 0500) Temp Source: Bladder (08/21 0400) BP: 124/54 (08/21 0430) Pulse Rate: 84 (08/21 0500)  Labs: Recent Labs    08/07/20 0413 08/07/20 0413 08/07/20 1620 08/08/20 0006 08/08/20 0208 08/08/20 1600 08/09/20 0300  HGB 9.6*   < >  --   --  8.6*  --  8.5*  HCT 32.6*  --   --   --  28.6*  --  28.1*  PLT 105*  --   --   --  98*  --  98*  APTT 61*  --   --   --  85*  --  49*  HEPARINUNFRC 0.28*   < > 0.46 0.38  --   --  0.15*  CREATININE 1.98*   < > 2.13*  --  2.13*  2.05* 2.17* 1.98*   < > = values in this interval not displayed.    Estimated Creatinine Clearance: 45.1 mL/min (A) (by C-G formula based on SCr of 1.98 mg/dL (H)).  Assessment: 64 yo male with COVID PNA, now on CRRT, D-dimer rose from 7 to 17 to start IV heparin per Rx dosing for acute R DVT peroneal vein.   Significant Events: -8/6: Pt had bradycardia, hypotension. Pt given alteplase 50 mg IV infusion over 2 hrs for PE @ 16:08 -8/6: Heparin infusion resumed @ 21:41 s/p TPA with aPTT <80  Today, 08/09/20  Heparin level = 0.15 units/mL; SUBtherapeutic   CBC: Hgb low/stable 8.5, Pltc low/stable 98K.   No bleeding or infusion issues noted  Remains on CRRT (last clotted 8/16 AM)  Goal of Therapy:  Heparin level 0.3 - 0.7 units/ml Monitor platelets by anticoagulation protocol: Yes   Plan:   Increase heparin infusion to 2250 units/hr  Re-check 8h heparin level  Daily heparin level and CBC while on heparin  Monitor for signs/symptoms of bleeding  [name], PharmD, BCPS 08/09/2020 5:12 AM

## 2020-08-10 ENCOUNTER — Inpatient Hospital Stay (HOSPITAL_COMMUNITY): Payer: Medicaid Other

## 2020-08-10 LAB — CBC
HCT: 27.8 % — ABNORMAL LOW (ref 39.0–52.0)
Hemoglobin: 8.5 g/dL — ABNORMAL LOW (ref 13.0–17.0)
MCH: 28.7 pg (ref 26.0–34.0)
MCHC: 30.6 g/dL (ref 30.0–36.0)
MCV: 93.9 fL (ref 80.0–100.0)
Platelets: 130 10*3/uL — ABNORMAL LOW (ref 150–400)
RBC: 2.96 MIL/uL — ABNORMAL LOW (ref 4.22–5.81)
RDW: 18.9 % — ABNORMAL HIGH (ref 11.5–15.5)
WBC: 6.9 10*3/uL (ref 4.0–10.5)
nRBC: 1 % — ABNORMAL HIGH (ref 0.0–0.2)

## 2020-08-10 LAB — RENAL FUNCTION PANEL
Albumin: 1.6 g/dL — ABNORMAL LOW (ref 3.5–5.0)
Anion gap: 7 (ref 5–15)
BUN: 51 mg/dL — ABNORMAL HIGH (ref 8–23)
CO2: 21 mmol/L — ABNORMAL LOW (ref 22–32)
Calcium: 7.4 mg/dL — ABNORMAL LOW (ref 8.9–10.3)
Chloride: 105 mmol/L (ref 98–111)
Creatinine, Ser: 1.6 mg/dL — ABNORMAL HIGH (ref 0.61–1.24)
GFR calc Af Amer: 52 mL/min — ABNORMAL LOW (ref >60)
GFR calc non Af Amer: 45 mL/min — ABNORMAL LOW (ref >60)
Glucose, Bld: 132 mg/dL — ABNORMAL HIGH (ref 70–99)
Phosphorus: 3.1 mg/dL (ref 2.5–4.6)
Potassium: 3.4 mmol/L — ABNORMAL LOW (ref 3.5–5.1)
Sodium: 133 mmol/L — ABNORMAL LOW (ref 135–145)

## 2020-08-10 LAB — BLOOD GAS, ARTERIAL
Acid-base deficit: 2.4 mmol/L — ABNORMAL HIGH (ref 0.0–2.0)
Bicarbonate: 24.5 mmol/L (ref 20.0–28.0)
Drawn by: 11249
FIO2: 80
MECHVT: 550 mL
O2 Saturation: 83.5 %
PEEP: 14 cmH2O
Patient temperature: 98.6
RATE: 34 resp/min
pCO2 arterial: 53.1 mmHg — ABNORMAL HIGH (ref 32.0–48.0)
pH, Arterial: 7.285 — ABNORMAL LOW (ref 7.350–7.450)
pO2, Arterial: 54.3 mmHg — ABNORMAL LOW (ref 83.0–108.0)

## 2020-08-10 LAB — MAGNESIUM: Magnesium: 2.3 mg/dL (ref 1.7–2.4)

## 2020-08-10 LAB — APTT: aPTT: 97 seconds — ABNORMAL HIGH (ref 24–36)

## 2020-08-10 LAB — HEPARIN LEVEL (UNFRACTIONATED)
Heparin Unfractionated: 0.4 IU/mL (ref 0.30–0.70)
Heparin Unfractionated: 0.42 IU/mL (ref 0.30–0.70)

## 2020-08-10 LAB — GLUCOSE, CAPILLARY
Glucose-Capillary: 116 mg/dL — ABNORMAL HIGH (ref 70–99)
Glucose-Capillary: 161 mg/dL — ABNORMAL HIGH (ref 70–99)

## 2020-08-20 NOTE — Progress Notes (Signed)
Patient's brother Alex [name] just called this RN to let us know that family have decided not to visit with body in the hospital, he did give me the name of funeral home.

## 2020-08-20 NOTE — Death Summary Note (Signed)
[name] was a 64 y.o. male smoker admitted on 08/08/2020 with several days of fever, weakness and dyspnea.  Found to have COVID 19 pneumonia.  He had not been vaccinated for COVID.  He required intubation.  He was treated with steroids, remedsivir and tocilizumab.  He required prone positioning and neuromuscular blockade.  He was started on pressor agents for septic shock.  He was found to have a DVT in his Rt leg.  He was treated empirically with thrombolytics for possible pulmonary embolism.  He developed worsening renal function and started on CRRT.  He did not have improvement in respiratory status or hemodynamics.  He was seen by palliative care.  Decision made for DNR status.  On August 22, 2020 he developed bradycardia and worsening hypotension leading to asystole.  He was pronounced dead at 11:09 am.  Final diagnoses: Acute hypoxic and hypercapnic respiratory failure from ARDS 2nd to COVID 19 pneumonia and acute pulmonary edema Septic shock present on admission Acute right leg DVT with pulmonary embolism AKI from ATN 2nd to sepsis and hypoxia CKD 3b Steroid induced hyperglycemia Acute metabolic encephalopathy 2nd to sepsis and hypoxia Anemia of critical illness Thrombocytopenia from sepsis DM type 2 poorly controlled with hyperglycemia History of hypertension  Chesley Mires, MD [place] Pager - [phone] 08-22-2020, 1:40 PM

## 2020-08-20 NOTE — TOC Transition Note (Signed)
Transition of Care [facility]) - CM/SW Discharge Note   Patient Details  Name: Alex [name] MRN: [id number] Date of Birth: Sep 25, 1956  [facility] CM/SW Contact:  Leeroy Cha, RN Phone Number: 08/11/2020, 7:41 AM   Clinical Narrative:    Patient expired on 09/04/2020   Final next level of care: Expired Barriers to Discharge: Continued Medical Work up   Patient Goals and CMS Choice Patient states their goals for this hospitalization and ongoing recovery are:: to go home CMS Medicare.gov Compare Post Acute Care list provided to:: Patient    Discharge Placement                       Discharge Plan and Services                                     Social Determinants of Health (SDOH) Interventions     Readmission Risk Interventions No flowsheet data found.

## 2020-08-20 NOTE — Progress Notes (Signed)
NAME:  Alex [name], MRN:  [id number], DOB:  Sep 26, 1956, LOS: 21 ADMISSION DATE:  08/14/2020, CONSULTATION DATE:  8/1 REFERRING MD:  Sedonia Small, CHIEF COMPLAINT:  Dyspnea   Brief History   64 y/o male smoker with acute hypoxemic respiratory failure due to COVID 19 pneumonia admitted on 8/1.  Past Medical History  Cigarette smoker DM2 Hypertension CKD?  Significant Hospital Events   8/1 admission 8/2 intubation 8/3 started CRRT 8/5 significant improvement in oxygenation; mid morning developed severe sudden shock of uncertain etiology, low grade temp 8/6 Given systemic TPA due to repeated episodes of shock and cardiac instability and concern for PE 8/11 still significantly hypoxemic, requiring intermittent paralytics.  Resumed proning 8/13 stopped proning as P/F ratio > 150 8/15 stopped amio due to low heart rate 8/18 unable to wean paralytics as patient becomes dyssynchronous, Family meeting with palliative care. 8/22 d/c airborne isolation  Consults:  Nephrology Palliative care  Procedures:  8/2 ETT >  8/2 L IJ CVL >  8/3 R IJ HD cath >   Significant Diagnostic Tests:  8/2 lower ext doppler/vascular ultrasound >> acute R DVT peroneal vein, left negative 8/2 renal ultrasound >> no hydro, findings consistent with medicorenal disease 8/4 Echo >> EF 60 to 65%, mild LVH 8/5 CT abd/pelvis >> retroperitoneal soft tissue stranding  8/5 CT head >> chronic small vessel ischemia  Micro Data:  8/1 SARS COV 2 > positive 8/1 blood -negative 8/1  MRSA  PCR  Neg  8/5 blood cultures x 2 > neg    Antimicrobials/COVID Rx:  8/1 ceftriaxone > 8/5-8/7 8/1 remdesivir -completed 8/5 8/1 tocilizumab   Interim history/subjective:  Increasing pressor requirements.  Remains on increased PEEP/FiO2, CRRT.  Objective:  Blood pressure (!) 58/33, pulse 99, temperature 97.9 F (36.6 C), resp. rate (!) 34, height 5\' 9"  (1.753 m), weight 110.7 kg, SpO2 100 %. CVP:  [3 mmHg-9 mmHg] 6 mmHg  Vent Mode:  PRVC FiO2 (%):  [45 %-100 %] 45 % Set Rate:  [34 bmp] 34 bmp Vt Set:  [550 mL-560 mL] 550 mL PEEP:  [10 cmH20-14 cmH20] 10 cmH20 Plateau Pressure:  [36 cmH20-45 cmH20] 42 cmH20   Intake/Output Summary (Last 24 hours) at September 08, 2020 1036 Last data filed at 08-Sep-2020 1000 Gross per 24 hour  Intake 3712.55 ml  Output 2745 ml  Net 967.55 ml   Filed Weights   08/05/20 0422 08/06/20 0243 September 08, 2020 0321  Weight: 102.8 kg 102.8 kg 110.7 kg   CVP:  [3 mmHg-9 mmHg] 6 mmHg   Examination:  General - sedated Eyes - pupils reactive ENT - ETT in place Cardiac - regular, bradycardic Chest - b/l crackles Abdomen - soft, non tender, + bowel sounds Extremities - 2+ edema Skin - no rashes Neuro - RASS -2  Resolved Hospital Problem list   Hyperkalemia, Paroxysmal atrial fibrillation, Anion gap metabolic acidosis  Assessment & Plan:   Acute hypoxic, hypercapnic respiratory failure with ARDS from COVID 19 pneumonia and acute pulmonary edema. - goal SpO2 88 to 95% - goal Vt 6 cc/kg - f/u CXR, ABG - wean off prednisone as tolerated - 21 days since positive COVID test >> can d/c airborne isolation on 8/22 - repeat sputum culture - likely will need trach if family wishes to continue aggressive therapy  Rt leg DVT with presumed PE. - s/p thrombolytic on 8/06 - continue heparin gtt  Septic shock. - present on admission - goal MAP > 65  AKI from ATN 2nd to sepsis. -  CRRT per renal - would benefit from volume removal, but hemodynamics are prohibitive  Steroid induced hyperglycemia. - SSI with levemir  Acute metabolic encephalopathy 2nd to sepsis, hypoxia. - RASS goal -1   Anemia of critical illness. Thrombocytopenia from sepsis. - f/u CBC  Goals of care. - palliative care consulted - DNR  Best practice:  Diet: tube feeds DVT prophylaxis: heparin infusion GI prophylaxis: Protonix Mobility: bed rest Code Status: DNR Disposition: remain in ICU  Labs:   CMP Latest Ref Rng  & Units Aug 31, 2020 08/09/2020 08/09/2020  Glucose 70 - 99 mg/dL 132(H) 166(H) 125(H)  BUN 8 - 23 mg/dL 51(H) 61(H) 60(H)  Creatinine 0.61 - 1.24 mg/dL 1.60(H) 1.88(H) 1.98(H)  Sodium 135 - 145 mmol/L 133(L) 132(L) 138  Potassium 3.5 - 5.1 mmol/L 3.4(L) 5.1 4.7  Chloride 98 - 111 mmol/L 105 100 102  CO2 22 - 32 mmol/L 21(L) 22 23  Calcium 8.9 - 10.3 mg/dL 7.4(L) 8.3(L) 8.9  Total Protein 6.5 - 8.1 g/dL - - -  Total Bilirubin 0.3 - 1.2 mg/dL - - -  Alkaline Phos 38 - 126 U/L - - -  AST 15 - 41 U/L - - -  ALT 0 - 44 U/L - - -    CBC Latest Ref Rng & Units August 31, 2020 08/09/2020 08/08/2020  WBC 4.0 - 10.5 K/uL 6.9 7.7 9.4  Hemoglobin 13.0 - 17.0 g/dL 8.5(L) 8.5(L) 8.6(L)  Hematocrit 39 - 52 % 27.8(L) 28.1(L) 28.6(L)  Platelets 150 - 400 K/uL 130(L) 98(L) 98(L)   ABG    Component Value Date/Time   PHART 7.285 (L) 08-31-2020 0400   PCO2ART 53.1 (H) 08/31/2020 0400   PO2ART 54.3 (L) 2020/08/31 0400   HCO3 24.5 08-31-2020 0400   TCO2 24 08/02/2020 0811   ACIDBASEDEF 2.4 (H) 31-Aug-2020 0400   O2SAT 83.5 08/31/20 0400    CBG (last 3)  Recent Labs    08/09/20 2316 2020/08/31 0319 2020/08/31 0819  GLUCAP 118* 116* 161*    Critical care time: 33 minutes  Chesley Mires, MD [place] Pager - [phone] 2020/08/31, 10:36 AM

## 2020-08-20 NOTE — Progress Notes (Addendum)
ANTICOAGULATION CONSULT NOTE - Follow Up Consult  Pharmacy Consult for Heparin Indication: acute DVT, presumed PE  No Known Allergies  Patient Measurements: Height: 5\' 9"  (175.3 cm) Weight: 102.8 kg (226 lb 10.5 oz) IBW/kg (Calculated) : 70.7 Heparin Dosing Weight: 95 kg  Vital Signs: Temp: 98.4 F (36.9 C) (08/22 0000) Temp Source: Core (08/22 0000) BP: 97/51 (08/22 0000) Pulse Rate: 85 (08/22 0000)  Labs: Recent Labs    08/07/20 0413 08/07/20 1620 08/08/20 0006 08/08/20 0208 08/08/20 0208 08/08/20 1600 08/09/20 0300 08/09/20 1415 08/09/20 1600 09-03-20 0029  HGB 9.6*  --   --  8.6*  --   --  8.5*  --   --   --   HCT 32.6*  --   --  28.6*  --   --  28.1*  --   --   --   PLT 105*  --   --  98*  --   --  98*  --   --   --   APTT 61*  --   --  85*  --   --  49*  --   --   --   HEPARINUNFRC 0.28*   < >   < >  --   --   --  0.15* 0.25*  --  0.40  CREATININE 1.98*   < >  --  2.13*   2.05*   < > 2.17* 1.98*  --  1.88*  --    < > = values in this interval not displayed.    Estimated Creatinine Clearance: 47.5 mL/min (A) (by C-G formula based on SCr of 1.88 mg/dL (H)).  Assessment: 64 yo male with COVID PNA, now on CRRT, D-dimer rose from 7 to 17 to start IV heparin per Rx dosing for acute R DVT peroneal vein.   Significant Events: -8/6: Pt had bradycardia, hypotension. Pt given alteplase 50 mg IV infusion over 2 hrs for PE @ 16:08 -8/6: Heparin infusion resumed @ 21:41 s/p TPA with aPTT <80  Today, 2020/09/03  Heparin level therapeutic x2 = 0.40 units/mL @ 00:29; 0.42 @ 0530  No bleeding or infusion issues noted per RN  Remains on CRRT  Goal of Therapy:  Heparin level 0.3 - 0.7 units/ml Monitor platelets by anticoagulation protocol: Yes   Plan:   Continue heparin infusion at 2350 units/hr  Daily heparin level and CBC while on heparin  Monitor for signs/symptoms of bleeding  Alex [name], PharmD, BCPS 2020/09/03 1:05 AM

## 2020-08-20 NOTE — Progress Notes (Signed)
Patient passed at 1109, this RN and charge Insurance account manager at bedside. Dr Halford Chessman outside of the room on the unit, he was notified. This RN called patient's brother Herbie Baltimore. Waiting for return call from family to find out if the will be coming to the hospital to visit body. Pt taken off covid precautions today by Dr Halford Chessman

## 2020-08-20 NOTE — Progress Notes (Signed)
PMT no charge note  Palliative medicine team has been following this patient for goals of care discussions as well as reviewing chart peripherally for additional support.  After checking in with bedside RN this am and after being notified that the patient passed at 1109 on August 15, 2020, call was placed to patient's brother Herbie Baltimore.  Offered condolences on the death of his brother.  Acknowledged the extraordinary circumstances created by the COVID-19 pandemic, acknowledged how hard it must have been for the family overall.  Discussed extensively that any and all possible critical care measures were employed and that the patient was treated with utmost dignity, however patient continued to have ongoing decline and decompensation that resulted in his death despite of aggressive medical measures.  Provided active listening and empathic presence as much as is possible over the telephone.  Herbie Baltimore is extremely concerned about the patient's mother as to how she will react to the news.  He is on his way to her house.  Another Sister Helene Kelp is on her way to the hospital because she would like to see the patient's body.  Overall, family is, according to Herbie Baltimore, experiencing a variety of emotions such as deep anger and sadness, however they are appreciative of all of the care that the patient has received here in the hospital.  Discussed about the acute grief/bereavement process, offered some resources.  All of his questions addressed to the best of my ability. No charge.  Loistine Chance MD [place] palliative team [phone].

## 2020-08-20 DEATH — deceased

## 2020-10-05 IMAGING — DX DG CHEST 1V PORT
2 series · 2 of 2 positions shown · non-contrast
Comparison: 07/31/2020

CLINICAL DATA: Acute respiratory failure

EXAM:
PORTABLE CHEST 1 VIEW

[chest ap (1 of 2)]
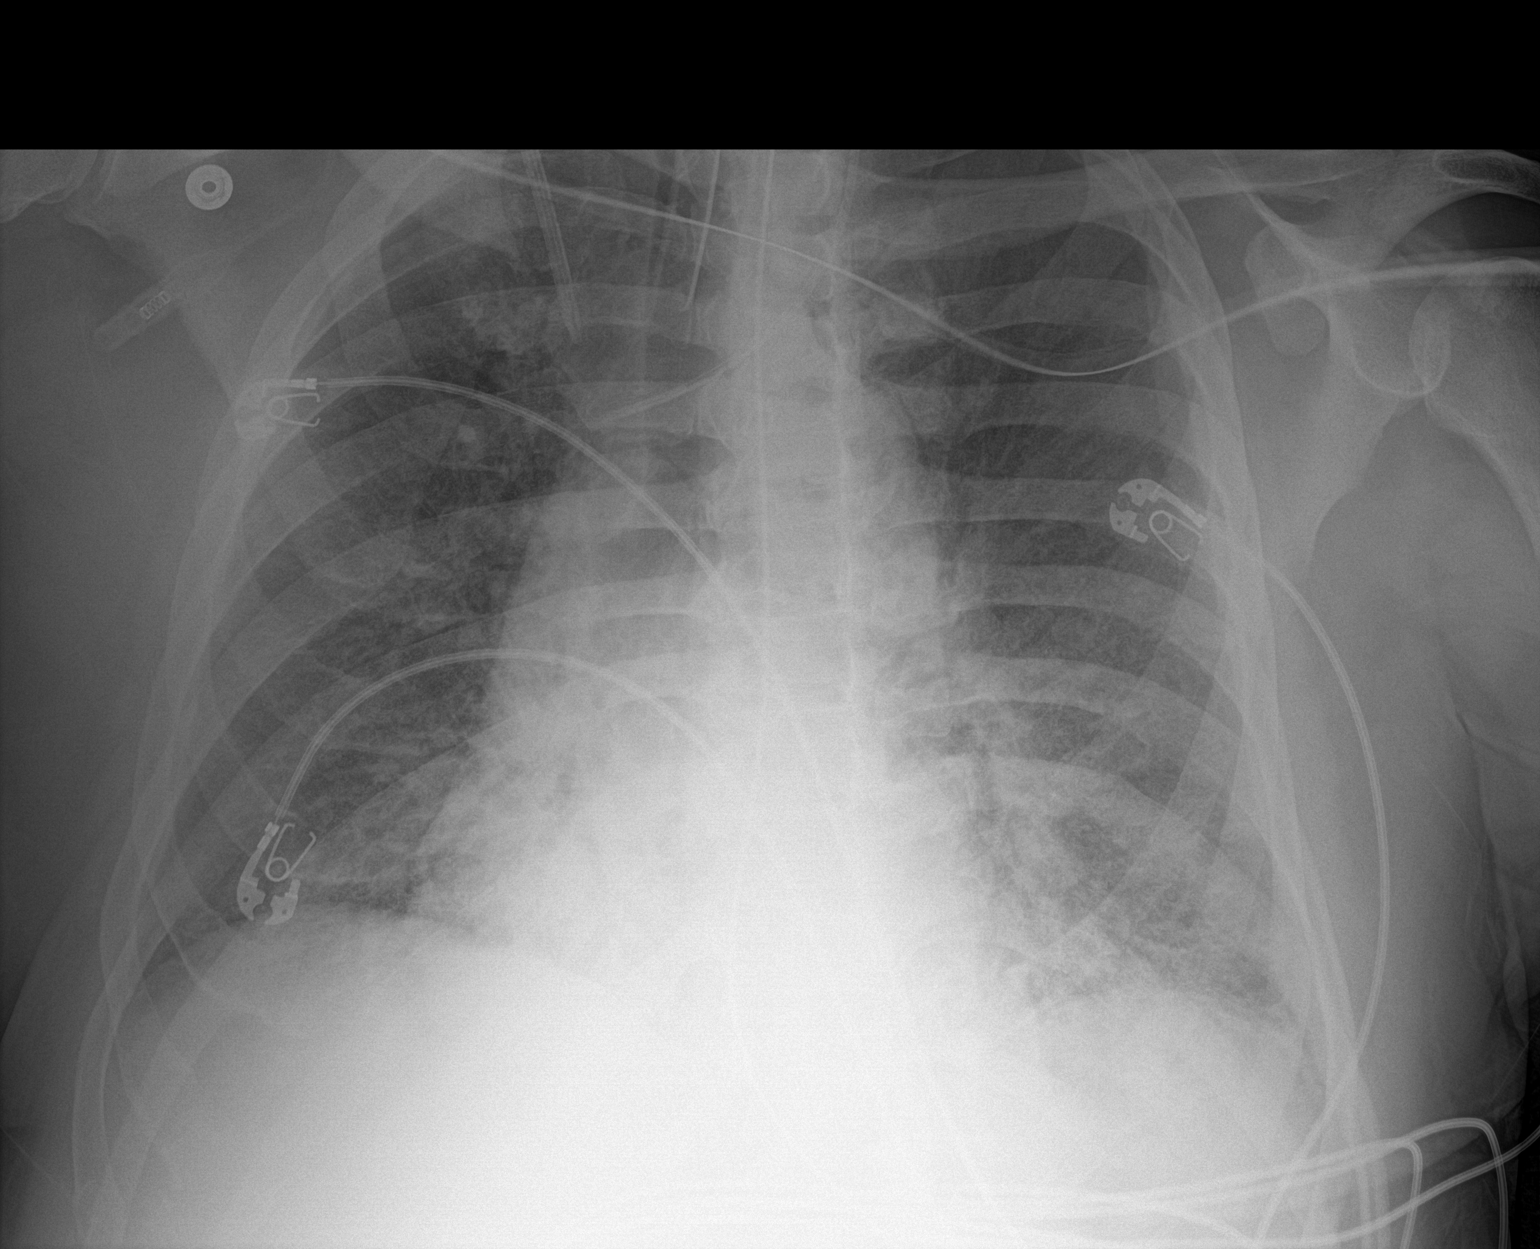

[chest ap (2 of 2)]
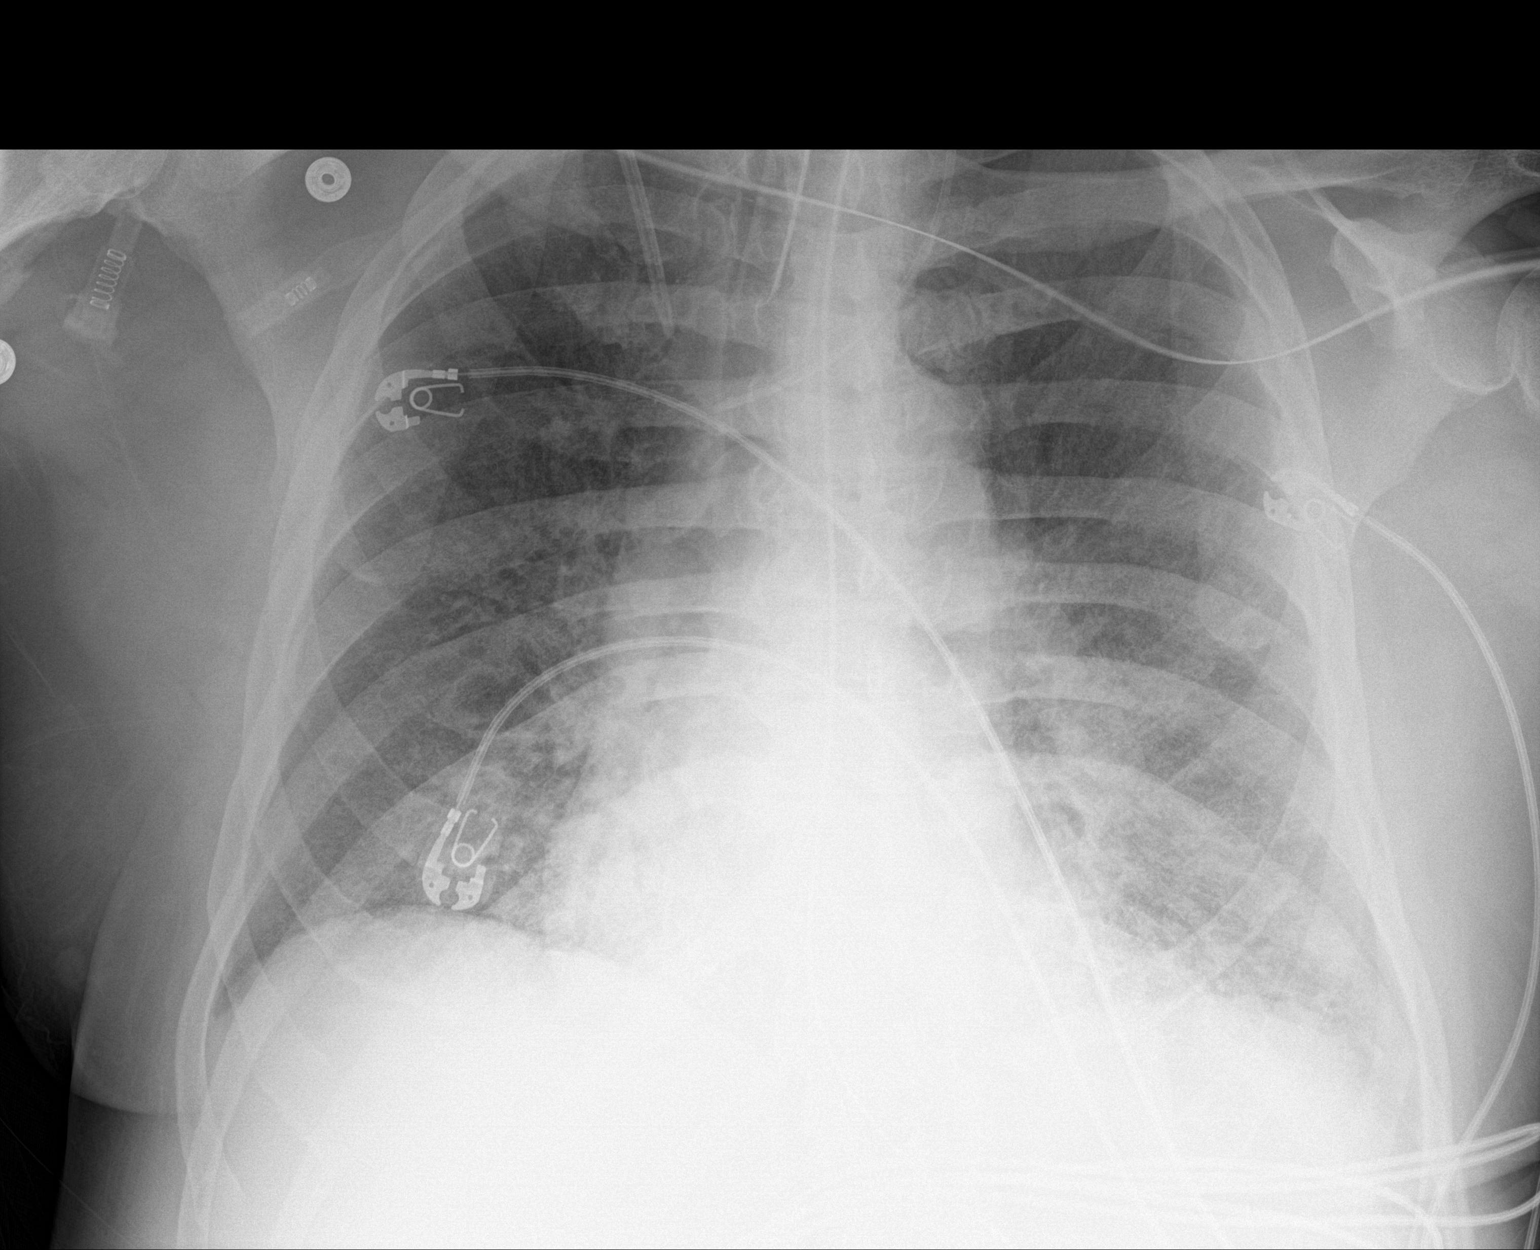

[2 of 2 positions shown; findings below may reference images not displayed]

FINDINGS: Endotracheal tube, gastric catheter and bilateral jugular lines are
again seen and stable. Cardiac shadow is stable. Persistent patchy
opacities are noted in both lungs predominately in the bases. No new
focal abnormality is seen.
IMPRESSION: Stable appearance of the chest when compare with the prior exam.

## 2020-10-10 IMAGING — DX DG CHEST 1V PORT
1 series · 1 of 1 positions shown · non-contrast
Comparison: 08/06/2020

CLINICAL DATA: Respiratory failure, [id number] pneumonia

EXAM:
PORTABLE CHEST 1 VIEW

[chest ap]
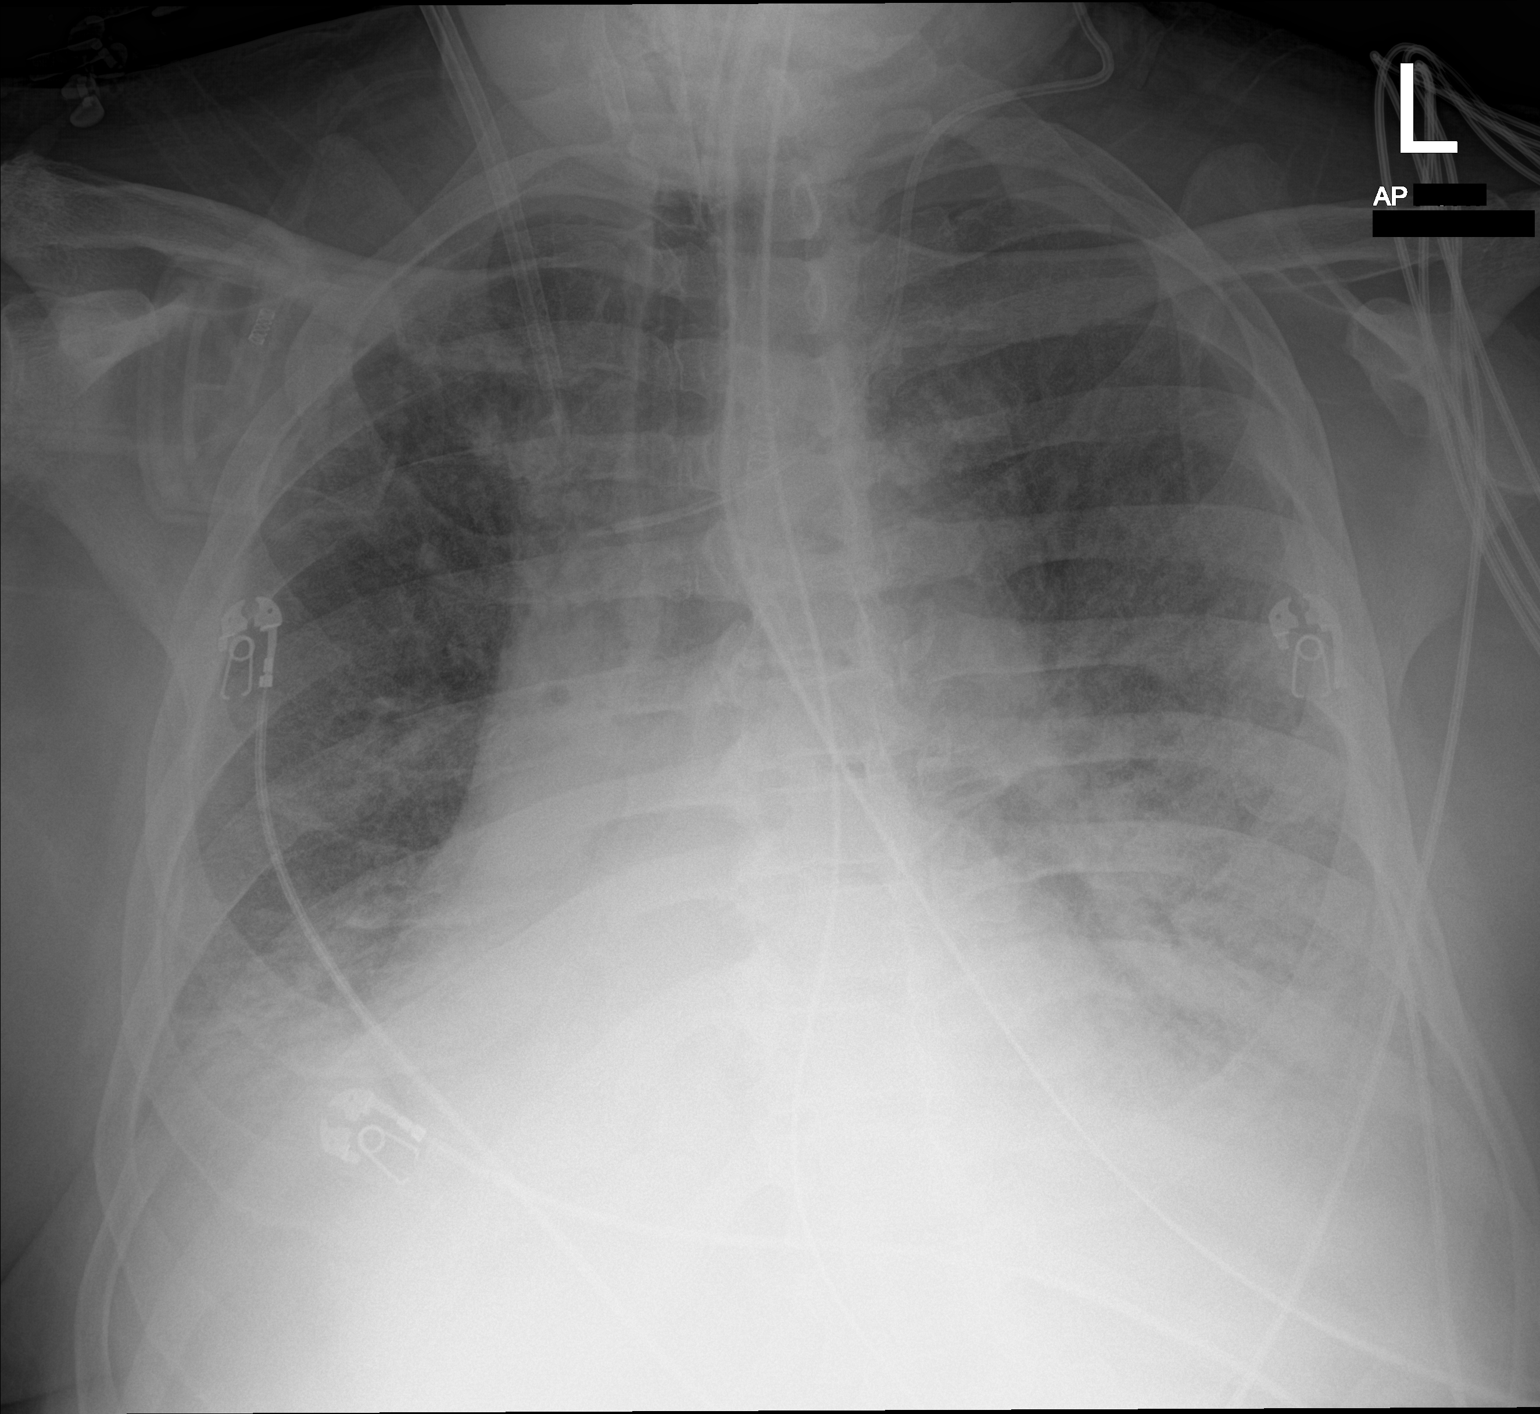

[1 of 1 positions shown; findings below may reference images not displayed]

FINDINGS: Patient is rotated. Endotracheal tube tip measures 9.5 cm above the
carina. Enteric tube courses below the diaphragm with distal tip
beyond the inferior margin of the film. Dual lumen right IJ central
venous catheter and left IJ central venous catheter positions are
stable from prior.

Stable cardiomediastinal contours. Extensive bilateral airspace
opacities which appears slightly progressed within the left mid
lung. Small bilateral pleural effusions. No pneumothorax.
IMPRESSION: 1. ET tube terminates 9.5 cm above the carina. Recommended
advancement 4-5 cm.
2. Extensive bilateral airspace opacities which appears slightly
progressed within the left mid lung.
3. Stable small bilateral pleural effusions.

These results will be called to the ordering clinician or
representative by the Radiologist Assistant, and communication
documented in the PACS or [REDACTED].

## 2020-10-14 DIAGNOSIS — Z7189 Other specified counseling: Secondary | ICD-10-CM

## 2020-10-14 DIAGNOSIS — Z515 Encounter for palliative care: Secondary | ICD-10-CM
